# Patient Record
Sex: Female | Born: 1985 | Race: White | Hispanic: No | Marital: Married | Smoking: Current every day smoker
Health system: Southern US, Community
[De-identification: ages and names within clinical notes are randomized; demographics above are authoritative.]

## PROBLEM LIST (undated history)

## (undated) DIAGNOSIS — N390 Urinary tract infection, site not specified: Secondary | ICD-10-CM

## (undated) DIAGNOSIS — Z8719 Personal history of other diseases of the digestive system: Secondary | ICD-10-CM

## (undated) DIAGNOSIS — T8859XA Other complications of anesthesia, initial encounter: Secondary | ICD-10-CM

## (undated) DIAGNOSIS — J40 Bronchitis, not specified as acute or chronic: Secondary | ICD-10-CM

## (undated) DIAGNOSIS — D649 Anemia, unspecified: Secondary | ICD-10-CM

## (undated) DIAGNOSIS — F329 Major depressive disorder, single episode, unspecified: Secondary | ICD-10-CM

## (undated) DIAGNOSIS — F1910 Other psychoactive substance abuse, uncomplicated: Secondary | ICD-10-CM

## (undated) DIAGNOSIS — K449 Diaphragmatic hernia without obstruction or gangrene: Secondary | ICD-10-CM

## (undated) DIAGNOSIS — F32A Depression, unspecified: Secondary | ICD-10-CM

## (undated) HISTORY — DX: Urinary tract infection, site not specified: N39.0

## (undated) HISTORY — DX: Anemia, unspecified: D64.9

## (undated) HISTORY — PX: TUBAL LIGATION: SHX77

## (undated) HISTORY — PX: DILATION AND CURETTAGE OF UTERUS: SHX78

## (undated) HISTORY — PX: TONSILLECTOMY: SUR1361

---
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
## 2005-10-05 ENCOUNTER — Emergency Department: Payer: Self-pay | Admitting: Emergency Medicine

## 2006-02-26 ENCOUNTER — Observation Stay: Payer: Self-pay | Admitting: Obstetrics & Gynecology

## 2006-02-28 ENCOUNTER — Observation Stay: Payer: Self-pay | Admitting: Unknown Physician Specialty

## 2006-04-08 ENCOUNTER — Observation Stay: Payer: Self-pay | Admitting: Obstetrics & Gynecology

## 2006-04-14 ENCOUNTER — Observation Stay: Payer: Self-pay | Admitting: Obstetrics and Gynecology

## 2006-04-26 ENCOUNTER — Observation Stay: Payer: Self-pay | Admitting: Obstetrics and Gynecology

## 2006-04-28 ENCOUNTER — Inpatient Hospital Stay: Payer: Self-pay

## 2006-10-27 ENCOUNTER — Emergency Department: Payer: Self-pay | Admitting: Emergency Medicine

## 2007-01-01 ENCOUNTER — Emergency Department: Payer: Self-pay | Admitting: Emergency Medicine

## 2007-07-27 ENCOUNTER — Observation Stay: Payer: Self-pay

## 2007-09-18 ENCOUNTER — Observation Stay: Payer: Self-pay | Admitting: Certified Nurse Midwife

## 2007-09-23 ENCOUNTER — Observation Stay: Payer: Self-pay | Admitting: Obstetrics and Gynecology

## 2007-10-05 ENCOUNTER — Inpatient Hospital Stay: Payer: Self-pay

## 2007-12-21 ENCOUNTER — Emergency Department: Payer: Self-pay | Admitting: Emergency Medicine

## 2007-12-23 ENCOUNTER — Ambulatory Visit: Payer: Self-pay | Admitting: Emergency Medicine

## 2008-05-25 ENCOUNTER — Emergency Department: Payer: Self-pay | Admitting: Unknown Physician Specialty

## 2009-06-24 ENCOUNTER — Emergency Department: Payer: Self-pay | Admitting: Emergency Medicine

## 2009-07-13 ENCOUNTER — Emergency Department: Payer: Self-pay | Admitting: Emergency Medicine

## 2009-08-01 ENCOUNTER — Emergency Department: Payer: Self-pay | Admitting: Emergency Medicine

## 2009-08-30 ENCOUNTER — Emergency Department: Payer: Self-pay | Admitting: Emergency Medicine

## 2009-09-01 ENCOUNTER — Emergency Department: Payer: Self-pay | Admitting: Emergency Medicine

## 2009-10-20 ENCOUNTER — Emergency Department: Payer: Self-pay | Admitting: Emergency Medicine

## 2009-11-01 ENCOUNTER — Emergency Department: Payer: Self-pay | Admitting: Emergency Medicine

## 2009-11-17 ENCOUNTER — Emergency Department: Payer: Self-pay | Admitting: Emergency Medicine

## 2010-03-03 ENCOUNTER — Observation Stay: Payer: Self-pay | Admitting: Obstetrics and Gynecology

## 2010-03-17 ENCOUNTER — Observation Stay: Payer: Self-pay | Admitting: Obstetrics and Gynecology

## 2010-04-16 ENCOUNTER — Observation Stay: Payer: Self-pay | Admitting: Obstetrics and Gynecology

## 2010-04-19 ENCOUNTER — Observation Stay: Payer: Self-pay

## 2010-05-02 ENCOUNTER — Ambulatory Visit: Payer: Self-pay | Admitting: Obstetrics and Gynecology

## 2010-05-03 ENCOUNTER — Observation Stay: Payer: Self-pay | Admitting: Obstetrics and Gynecology

## 2010-05-10 ENCOUNTER — Observation Stay: Payer: Self-pay | Admitting: Obstetrics and Gynecology

## 2010-05-16 ENCOUNTER — Observation Stay: Payer: Self-pay | Admitting: Obstetrics and Gynecology

## 2010-05-19 ENCOUNTER — Inpatient Hospital Stay: Payer: Self-pay

## 2010-06-21 ENCOUNTER — Ambulatory Visit: Payer: Self-pay | Admitting: Anesthesiology

## 2010-06-24 ENCOUNTER — Ambulatory Visit: Payer: Self-pay | Admitting: Obstetrics and Gynecology

## 2010-08-13 ENCOUNTER — Emergency Department: Payer: Self-pay | Admitting: Emergency Medicine

## 2011-03-27 ENCOUNTER — Emergency Department: Payer: Self-pay | Admitting: Emergency Medicine

## 2011-03-27 LAB — URINALYSIS, COMPLETE
Bilirubin,UR: NEGATIVE
Blood: NEGATIVE
Glucose,UR: NEGATIVE mg/dL (ref 0–75)
Ketone: NEGATIVE
Nitrite: NEGATIVE
Ph: 7 (ref 4.5–8.0)
Protein: NEGATIVE
RBC,UR: 1 /HPF (ref 0–5)
Specific Gravity: 1.021 (ref 1.003–1.030)
Squamous Epithelial: 8
WBC UR: 1 /HPF (ref 0–5)

## 2011-03-27 LAB — PREGNANCY, URINE: Pregnancy Test, Urine: NEGATIVE m[IU]/mL

## 2011-04-03 ENCOUNTER — Emergency Department: Payer: Self-pay | Admitting: Emergency Medicine

## 2011-04-03 LAB — URINALYSIS, COMPLETE
Bacteria: NONE SEEN
Bilirubin,UR: NEGATIVE
Blood: NEGATIVE
Glucose,UR: NEGATIVE mg/dL (ref 0–75)
Ketone: NEGATIVE
Nitrite: NEGATIVE
Ph: 5 (ref 4.5–8.0)
Protein: NEGATIVE
RBC,UR: 2 /HPF (ref 0–5)
Specific Gravity: 1.018 (ref 1.003–1.030)
Squamous Epithelial: 6
WBC UR: 8 /HPF (ref 0–5)

## 2011-04-05 LAB — URINE CULTURE

## 2011-05-16 ENCOUNTER — Emergency Department: Payer: Self-pay | Admitting: Emergency Medicine

## 2013-04-30 ENCOUNTER — Emergency Department: Payer: Self-pay | Admitting: Emergency Medicine

## 2013-11-16 ENCOUNTER — Emergency Department: Payer: Self-pay | Admitting: Emergency Medicine

## 2013-12-26 ENCOUNTER — Emergency Department: Payer: Self-pay | Admitting: Emergency Medicine

## 2013-12-26 LAB — URINALYSIS, COMPLETE
Bilirubin,UR: NEGATIVE
Blood: NEGATIVE
Glucose,UR: NEGATIVE mg/dL (ref 0–75)
Ketone: NEGATIVE
Nitrite: NEGATIVE
Ph: 6 (ref 4.5–8.0)
Protein: NEGATIVE
RBC,UR: 8 /HPF (ref 0–5)
Renal Epithelial: 7
Specific Gravity: 1.025 (ref 1.003–1.030)
Squamous Epithelial: 35
WBC UR: 20 /HPF (ref 0–5)

## 2013-12-26 LAB — PREGNANCY, URINE: Pregnancy Test, Urine: NEGATIVE m[IU]/mL

## 2014-11-04 ENCOUNTER — Encounter: Payer: Self-pay | Admitting: Emergency Medicine

## 2014-11-04 ENCOUNTER — Emergency Department
Admission: EM | Admit: 2014-11-04 | Discharge: 2014-11-04 | Disposition: A | Discharge status: Home / Self Care | Payer: Medicaid Other | Source: Home / Self Care | Attending: Emergency Medicine | Admitting: Emergency Medicine

## 2014-11-04 ENCOUNTER — Encounter: Payer: Self-pay | Admitting: *Deleted

## 2014-11-04 ENCOUNTER — Emergency Department
Admission: EM | Admit: 2014-11-04 | Discharge: 2014-11-04 | Disposition: A | Discharge status: Home / Self Care | Payer: Medicaid Other | Attending: Emergency Medicine | Admitting: Emergency Medicine

## 2014-11-04 DIAGNOSIS — L299 Pruritus, unspecified: Secondary | ICD-10-CM | POA: Insufficient documentation

## 2014-11-04 DIAGNOSIS — Z7952 Long term (current) use of systemic steroids: Secondary | ICD-10-CM

## 2014-11-04 DIAGNOSIS — Z79899 Other long term (current) drug therapy: Secondary | ICD-10-CM

## 2014-11-04 DIAGNOSIS — R21 Rash and other nonspecific skin eruption: Secondary | ICD-10-CM | POA: Diagnosis present

## 2014-11-04 DIAGNOSIS — T7840XD Allergy, unspecified, subsequent encounter: Secondary | ICD-10-CM | POA: Insufficient documentation

## 2014-11-04 DIAGNOSIS — X58XXXD Exposure to other specified factors, subsequent encounter: Secondary | ICD-10-CM

## 2014-11-04 DIAGNOSIS — L509 Urticaria, unspecified: Secondary | ICD-10-CM | POA: Insufficient documentation

## 2014-11-04 DIAGNOSIS — Z720 Tobacco use: Secondary | ICD-10-CM

## 2014-11-04 LAB — BASIC METABOLIC PANEL
Anion gap: 11 (ref 5–15)
BUN: 13 mg/dL (ref 6–20)
CO2: 23 mmol/L (ref 22–32)
Calcium: 9.8 mg/dL (ref 8.9–10.3)
Chloride: 105 mmol/L (ref 101–111)
Creatinine, Ser: 0.74 mg/dL (ref 0.44–1.00)
GFR calc Af Amer: >60 mL/min (ref >60)
GFR calc non Af Amer: >60 mL/min (ref >60)
Glucose, Bld: 110 mg/dL — ABNORMAL HIGH (ref 65–99)
Potassium: 3.8 mmol/L (ref 3.5–5.1)
Sodium: 139 mmol/L (ref 135–145)

## 2014-11-04 LAB — URINALYSIS COMPLETE WITH MICROSCOPIC (ARMC ONLY)
Bilirubin Urine: NEGATIVE
Glucose, UA: NEGATIVE mg/dL
Hgb urine dipstick: NEGATIVE
Ketones, ur: NEGATIVE mg/dL
Nitrite: NEGATIVE
Protein, ur: NEGATIVE mg/dL
Specific Gravity, Urine: 1.025 (ref 1.005–1.030)
pH: 6 (ref 5.0–8.0)

## 2014-11-04 LAB — CBC
HCT: 44.1 % (ref 35.0–47.0)
Hemoglobin: 14 g/dL (ref 12.0–16.0)
MCH: 26.1 pg (ref 26.0–34.0)
MCHC: 31.9 g/dL — ABNORMAL LOW (ref 32.0–36.0)
MCV: 81.9 fL (ref 80.0–100.0)
Platelets: 367 10*3/uL (ref 150–440)
RBC: 5.38 MIL/uL — ABNORMAL HIGH (ref 3.80–5.20)
RDW: 14.4 % (ref 11.5–14.5)
WBC: 21.6 10*3/uL — ABNORMAL HIGH (ref 3.6–11.0)

## 2014-11-04 MED ORDER — EPINEPHRINE 0.3 MG/0.3ML IJ SOAJ: 0.3000 mg | Freq: Once | INTRAMUSCULAR | Status: DC

## 2014-11-04 MED ORDER — CETIRIZINE HCL 10 MG PO TABS
ORAL_TABLET | ORAL | Status: AC
  Administered 2014-11-04: 10 mg
  Filled 2014-11-04: qty 1

## 2014-11-04 MED ORDER — NON FORMULARY: 10.0000 mg | Freq: Once | Status: DC

## 2014-11-04 MED ORDER — FAMOTIDINE IN NACL 20-0.9 MG/50ML-% IV SOLN
20.0000 mg | Freq: Once | INTRAVENOUS | Status: AC
  Administered 2014-11-04: 20 mg via INTRAVENOUS
  Filled 2014-11-04: qty 50

## 2014-11-04 MED ORDER — DIPHENHYDRAMINE HCL 50 MG/ML IJ SOLN
50.0000 mg | Freq: Once | INTRAMUSCULAR | Status: AC
  Administered 2014-11-04: 50 mg via INTRAVENOUS
  Filled 2014-11-04: qty 1

## 2014-11-04 MED ORDER — PREDNISONE 20 MG PO TABS
60.0000 mg | ORAL_TABLET | ORAL | Status: AC
  Administered 2014-11-04: 60 mg via ORAL
  Filled 2014-11-04: qty 3

## 2014-11-04 MED ORDER — DIPHENHYDRAMINE HCL 50 MG/ML IJ SOLN
25.0000 mg | Freq: Once | INTRAMUSCULAR | Status: AC
  Administered 2014-11-04: 25 mg via INTRAVENOUS
  Filled 2014-11-04: qty 1

## 2014-11-04 MED ORDER — CETIRIZINE HCL 10 MG PO TABS: 10.0000 mg | ORAL_TABLET | Freq: Once | ORAL | Status: DC

## 2014-11-04 MED ORDER — PREDNISONE 20 MG PO TABS
60.0000 mg | ORAL_TABLET | Freq: Every day | ORAL | Status: AC
Start: 2014-11-04 — End: 2015-11-04

## 2014-11-04 MED ORDER — LORATADINE 10 MG PO TABS: 10.0000 mg | ORAL_TABLET | Freq: Once | ORAL | Status: DC

## 2014-11-04 MED ORDER — METHYLPREDNISOLONE SODIUM SUCC 125 MG IJ SOLR
125.0000 mg | Freq: Once | INTRAMUSCULAR | Status: AC
  Administered 2014-11-04: 125 mg via INTRAVENOUS
  Filled 2014-11-04: qty 2

## 2014-11-04 NOTE — ED Notes (Signed)
Iv from computer list d/c'd - not in pt

## 2014-11-04 NOTE — ED Provider Notes (Signed)
[facility] Emergency Department Provider Note  ____________________________________________  Time seen: Approximately 7:15 PM  I have reviewed the triage vital signs and the nursing notes.   HISTORY  Chief Complaint Headache; Weakness; and Rash    HPI Emma Fowler is a 29 y.o. female with no significant past medical history but who was seen yesterday for an allergic reaction to an unknown source.  She comes back today feeling like her face is flushed and itching again.  She states that she feels "really weird".  Back today but she did state that she did not get her prednisone filled so she has not had another dose today, and that she had not been taking Benadryl today until after the itching and the rash came back.  She denies chest pain, shortness of breath, nausea, vomiting, abdominal pain, diarrhea, dysuria.  She has not been lightheaded or dizzy, just repeats that she "feels weird".   History reviewed. No pertinent past medical history.  There are no active problems to display for this patient.   Past Surgical History  Procedure Laterality Date  . Dilation and curettage of uterus    . Tubal ligation    . Tonsillectomy      Current Outpatient Rx  Name  Route  Sig  Dispense  Refill  . EPINEPHrine (EPIPEN 2-PAK) 0.3 mg/0.3 mL IJ SOAJ injection   Intramuscular   Inject 0.3 mLs (0.3 mg total) into the muscle once.   1 Device   0   . predniSONE (DELTASONE) 20 MG tablet   Oral   Take 3 tablets (60 mg total) by mouth daily with breakfast.   15 tablet   0     Allergies Review of patient's allergies indicates no known allergies.  No family history on file.  Social History Social History  Substance Use Topics  . Smoking status: Current Every Day Smoker -- 0.50 packs/day    Types: Cigarettes  . Smokeless tobacco: None  . Alcohol Use: Yes     Comment: occasionally    Review of Systems Constitutional: No fever/chills Eyes: No visual  changes. ENT: No sore throat. Cardiovascular: Denies chest pain. Respiratory: Denies shortness of breath. Gastrointestinal: No abdominal pain.  No nausea, no vomiting.  No diarrhea.  No constipation. Genitourinary: Negative for dysuria. Musculoskeletal: Negative for back pain. Skin: Red rash primarily on her face and on her back shoulders which is itching Neurological: Negative for headaches, focal weakness or numbness.  10-point ROS otherwise negative.  ____________________________________________   PHYSICAL EXAM:  VITAL SIGNS: ED Triage Vitals  Enc Vitals Group     BP 11/04/14 1906 127/78 mmHg     Pulse Rate 11/04/14 1906 97     Resp 11/04/14 1906 16     Temp 11/04/14 1906 98.2 F (36.8 C)     Temp Source 11/04/14 1906 Oral     SpO2 11/04/14 1906 97 %     Weight 11/04/14 1906 275 lb (124.739 kg)     Height 11/04/14 1906 5\' 4"  (1.626 m)     Head Cir --      Peak Flow --      Pain Score 11/04/14 1907 10     Pain Loc --      Pain Edu? --      Excl. in GC? --     Constitutional: Alert and oriented. Well appearing and in no acute distress. Eyes: Conjunctivae are normal. PERRL. EOMI. Head: Atraumatic. Nose: No congestion/rhinnorhea. Mouth/Throat: Mucous membranes are  moist.  Oropharynx non-erythematous. Neck: No stridor.   Cardiovascular: Normal rate, regular rhythm. Grossly normal heart sounds.  Good peripheral circulation. Respiratory: Normal respiratory effort.  No retractions. Lungs CTAB.  No wheezing Gastrointestinal: Obese, Soft and nontender. No distention. No abdominal bruits. No CVA tenderness. Musculoskeletal: No lower extremity tenderness nor edema.  No joint effusions. Neurologic:  Normal speech and language. No gross focal neurologic deficits are appreciated.  Skin:  Skin is warm, dry and intact.  The patient has erythema on her face and on her shoulders that looked most consistent with sun exposed areas, although she insists that she has not gotten a sunburn.   She is scratching at her neck but has no obvious urticaria.  She has no evidence of cellulitis, no pustules, no bullae. Psychiatric: Mood and affect are flat. Speech and behavior are normal.  ____________________________________________   LABS (all labs ordered are listed, but only abnormal results are displayed)  Labs Reviewed  BASIC METABOLIC PANEL - Abnormal; Notable for the following:    Glucose, Bld 110 (*)    All other components within normal limits  CBC - Abnormal; Notable for the following:    WBC 21.6 (*)    RBC 5.38 (*)    MCHC 31.9 (*)    All other components within normal limits  URINALYSIS COMPLETEWITH MICROSCOPIC ([facility] ONLY) - Abnormal; Notable for the following:    Color, Urine YELLOW (*)    APPearance CLOUDY (*)    Leukocytes, UA 2+ (*)    Bacteria, UA RARE (*)    Squamous Epithelial / LPF TOO NUMEROUS TO COUNT (*)    All other components within normal limits   ____________________________________________  EKG  Not indicated ____________________________________________  RADIOLOGY   Not indicated  ____________________________________________   PROCEDURES  Procedure(s) performed: None  Critical Care performed: No ____________________________________________   INITIAL IMPRESSION / ASSESSMENT AND PLAN / ED COURSE  Pertinent labs & imaging results that were available during my care of the patient were reviewed by me and considered in my medical decision making (see chart for details).  The patient has no signs or symptoms of anaphylaxis.  She was stable through several hours of observation in the emergency department.  Her leukocytosis is almost certainly a result of the Solu-Medrol she received yesterday as well as her allergic reaction in general.  I advised her that it is important for her to take regular doses of Benadryl and to continue with the prednisone as prescribed.  I gave her a dose of prednisone and another dose of Benadryl here.  She  understands importance of follow-up.  I do not believe that she has an emergent medical condition at this time she is feeling better, ambulating with no difficulty prior to discharge.   ____________________________________________  FINAL CLINICAL IMPRESSION(S) / ED DIAGNOSES  Final diagnoses:  Allergic reaction, subsequent encounter      NEW MEDICATIONS STARTED DURING THIS VISIT:  New Prescriptions   No medications on file     Loleta Rose, MD 11/04/14 2145

## 2014-11-04 NOTE — ED Notes (Signed)
Pt given a prescription for prednisone but states pharmacy was closed when her husband went to pick it up.

## 2014-11-04 NOTE — ED Notes (Signed)
Patient reports being treated for an allergic reaction last night with benadryl and pepcid.  Patient states she left the ED at 4am and went home and to bed.  Patient states this afternoon at about 2:30 patient felt her face getting really hot, now patient reports severe headache, and weakness.  Patient states, "I feel really weird."  Patient reports the rash from yesterday came back and patient reports taking benadryl today around 5:30pm and reports rash improving.  Patient is tearful in triage.

## 2014-11-04 NOTE — ED Notes (Signed)
Pt reports hives that began under her breast and has progressively spread that began approx 15:00 yesterday, pt has taken x2 doses of benandryl w/o relief, last dose approx pta. Pt states now her face has become red and hot. Pt in no acute distress, denies shortness of breath or oral/throat swelling.

## 2014-11-04 NOTE — ED Provider Notes (Signed)
[facility] Emergency Department Provider Note  ____________________________________________  Time seen: 1:30 AM  I have reviewed the triage vital signs and the nursing notes.   HISTORY  Chief Complaint Pruritis and Urticaria     HPI JEANNIFER DRAKEFORD is a 29 y.o. female presents with generalized pruritic urticarial rash 1 hour before evaluation. Patient denies any dyspnea no difficulty swallowing. Patient does not have any known allergens. Patient denies any change in detergent no change in dietary intake and no new medications.   Past medical history None There are no active problems to display for this patient.   Past Surgical History  Procedure Laterality Date  . Dilation and curettage of uterus    . Tubal ligation    . Tonsillectomy      No current outpatient prescriptions on file.  Allergies No known drug allergies History reviewed. No pertinent family history.  Social History Social History  Substance Use Topics  . Smoking status: Current Every Day Smoker -- 0.50 packs/day    Types: Cigarettes  . Smokeless tobacco: None  . Alcohol Use: Yes     Comment: occasionally    Review of Systems  Constitutional: Negative for fever. Eyes: Negative for visual changes. ENT: Negative for sore throat. Cardiovascular: Negative for chest pain. Respiratory: Negative for shortness of breath. Gastrointestinal: Negative for abdominal pain, vomiting and diarrhea. Genitourinary: Negative for dysuria. Musculoskeletal: Negative for back pain. Skin: Positive rash Neurological: Negative for headaches, focal weakness or numbness.   10-point ROS otherwise negative.  ____________________________________________   PHYSICAL EXAM:  VITAL SIGNS: ED Triage Vitals  Enc Vitals Group     BP 11/04/14 0045 142/74 mmHg     Pulse Rate 11/04/14 0045 110     Resp 11/04/14 0045 18     Temp 11/04/14 0045 98.2 F (36.8 C)     Temp Source 11/04/14 0045 Oral      SpO2 11/04/14 0045 97 %     Weight 11/04/14 0045 275 lb (124.739 kg)     Height 11/04/14 0045 5\' 4"  (1.626 m)     Head Cir --      Peak Flow --      Pain Score --      Pain Loc --      Pain Edu? --      Excl. in GC? --      Constitutional: Alert and oriented. Well appearing and in no distress. Eyes: Conjunctivae are normal. PERRL. Normal extraocular movements. ENT   Head: Normocephalic and atraumatic.   Nose: No congestion/rhinnorhea.   Mouth/Throat: Mucous membranes are moist.   Neck: No stridor. Cardiovascular: Normal rate, regular rhythm. Normal and symmetric distal pulses are present in all extremities. No murmurs, rubs, or gallops. Respiratory: Normal respiratory effort without tachypnea nor retractions. Breath sounds are clear and equal bilaterally. No wheezes/rales/rhonchi. Gastrointestinal: Soft and nontender. No distention. There is no CVA tenderness. Genitourinary: deferred Musculoskeletal: Nontender with normal range of motion in all extremities. No joint effusions.  No lower extremity tenderness nor edema. Neurologic:  Normal speech and language. No gross focal neurologic deficits are appreciated. Speech is normal.  Skin:  Generalized urticarial rash Psychiatric: Mood and affect are normal. Speech and behavior are normal. Patient exhibits appropriate insight and judgment.     INITIAL IMPRESSION / ASSESSMENT AND PLAN / ED COURSE  Pertinent labs & imaging results that were available during my care of the patient were reviewed by me and considered in my medical decision making (see chart  for details).  Patient received IV Benadryl 50 mg Solu-Medrol 125 mg and Pepcid with complete resolution of rash and pruritus.  ____________________________________________   FINAL CLINICAL IMPRESSION(S) / ED DIAGNOSES  Final diagnoses:  Hives      Darci Current, MD 11/04/14 0230

## 2014-11-04 NOTE — Discharge Instructions (Signed)
Hives Hives are itchy, red, swollen areas of the skin. They can vary in size and location on your body. Hives can come and go for hours or several days (acute hives) or for several weeks (chronic hives). Hives do not spread from person to person (noncontagious). They may get worse with scratching, exercise, and emotional stress. CAUSES   Allergic reaction to food, additives, or drugs.  Infections, including the common cold.  Illness, such as vasculitis, lupus, or thyroid disease.  Exposure to sunlight, heat, or cold.  Exercise.  Stress.  Contact with chemicals. SYMPTOMS   Red or white swollen patches on the skin. The patches may change size, shape, and location quickly and repeatedly.  Itching.  Swelling of the hands, feet, and face. This may occur if hives develop deeper in the skin. DIAGNOSIS  Your caregiver can usually tell what is wrong by performing a physical exam. Skin or blood tests may also be done to determine the cause of your hives. In some cases, the cause cannot be determined. TREATMENT  Mild cases usually get better with medicines such as antihistamines. Severe cases may require an emergency epinephrine injection. If the cause of your hives is known, treatment includes avoiding that trigger.  HOME CARE INSTRUCTIONS   Avoid causes that trigger your hives.  Take antihistamines as directed by your caregiver to reduce the severity of your hives. Non-sedating or low-sedating antihistamines are usually recommended. Do not drive while taking an antihistamine.  Take any other medicines prescribed for itching as directed by your caregiver.  Wear loose-fitting clothing.  Keep all follow-up appointments as directed by your caregiver. SEEK MEDICAL CARE IF:   You have persistent or severe itching that is not relieved with medicine.  You have painful or swollen joints. SEEK IMMEDIATE MEDICAL CARE IF:   You have a fever.  Your tongue or lips are swollen.  You have  trouble breathing or swallowing.  You feel tightness in the throat or chest.  You have abdominal pain. These problems may be the first sign of a life-threatening allergic reaction. Call your local emergency services (911 in [place] MAKE SURE YOU:   Understand these instructions.  Will watch your condition.  Will get help right away if you are not doing well or get worse. Document Released: 02/24/2005 Document Revised: 03/01/2013 Document Reviewed: 05/20/2011 [employer] Patient Information 2015 [employer]. This information is not intended to replace advice given to you by your health care provider. Make sure you discuss any questions you have with your health care provider.  

## 2014-11-04 NOTE — Discharge Instructions (Signed)
You have been seen in the Emergency Department (ED) today for an allergic reaction.  Please take your medications as prescribed and follow up with your doctor as indicated.  You should also take over-the-counter Benadryl around the clock for the next three days according to the dosing instructions on the package.  Return to the Emergency Department (ED) if you experience any worsening or new symptoms that concern you.   Allergies Allergies may happen from anything your body is sensitive to. This may be food, medicines, pollens, chemicals, and nearly anything around you in everyday life that produces allergens. An allergen is anything that causes an allergy producing substance. Heredity is often a factor in causing these problems. This means you may have some of the same allergies as your parents. Food allergies happen in all age groups. Food allergies are some of the most severe and life threatening. Some common food allergies are cow's milk, seafood, eggs, nuts, wheat, and soybeans. SYMPTOMS   Swelling around the mouth.  An itchy red rash or hives.  Vomiting or diarrhea.  Difficulty breathing. SEVERE ALLERGIC REACTIONS ARE LIFE-THREATENING. This reaction is called anaphylaxis. It can cause the mouth and throat to swell and cause difficulty with breathing and swallowing. In severe reactions only a trace amount of food (for example, peanut oil in a salad) may cause death within seconds. Seasonal allergies occur in all age groups. These are seasonal because they usually occur during the same season every year. They may be a reaction to molds, grass pollens, or tree pollens. Other causes of problems are house dust mite allergens, pet dander, and mold spores. The symptoms often consist of nasal congestion, a runny itchy nose associated with sneezing, and tearing itchy eyes. There is often an associated itching of the mouth and ears. The problems happen when you come in contact with pollens and other  allergens. Allergens are the particles in the air that the body reacts to with an allergic reaction. This causes you to release allergic antibodies. Through a chain of events, these eventually cause you to release histamine into the blood stream. Although it is meant to be protective to the body, it is this release that causes your discomfort. This is why you were given anti-histamines to feel better. If you are unable to pinpoint the offending allergen, it may be determined by skin or blood testing. Allergies cannot be cured but can be controlled with medicine. Hay fever is a collection of all or some of the seasonal allergy problems. It may often be treated with simple over-the-counter medicine such as diphenhydramine. Take medicine as directed. Do not drink alcohol or drive while taking this medicine. Check with your caregiver or package insert for child dosages. If these medicines are not effective, there are many new medicines your caregiver can prescribe. Stronger medicine such as nasal spray, eye drops, and corticosteroids may be used if the first things you try do not work well. Other treatments such as immunotherapy or desensitizing injections can be used if all else fails. Follow up with your caregiver if problems continue. These seasonal allergies are usually not life threatening. They are generally more of a nuisance that can often be handled using medicine. HOME CARE INSTRUCTIONS   If unsure what causes a reaction, keep a diary of foods eaten and symptoms that follow. Avoid foods that cause reactions.  If hives or rash are present:  Take medicine as directed.  You may use an over-the-counter antihistamine (diphenhydramine) for hives and itching as  needed.  Apply cold compresses (cloths) to the skin or take baths in cool water. Avoid hot baths or showers. Heat will make a rash and itching worse.  If you are severely allergic:  Following a treatment for a severe reaction, hospitalization  is often required for closer follow-up.  Wear a medic-alert bracelet or necklace stating the allergy.  You and your family must learn how to give adrenaline or use an anaphylaxis kit.  If you have had a severe reaction, always carry your anaphylaxis kit or EpiPen with you. Use this medicine as directed by your caregiver if a severe reaction is occurring. Failure to do so could have a fatal outcome. SEEK MEDICAL CARE IF:  You suspect a food allergy. Symptoms generally happen within 30 minutes of eating a food.  Your symptoms have not gone away within 2 days or are getting worse.  You develop new symptoms.  You want to retest yourself or your child with a food or drink you think causes an allergic reaction. Never do this if an anaphylactic reaction to that food or drink has happened before. Only do this under the care of a caregiver. SEEK IMMEDIATE MEDICAL CARE IF:   You have difficulty breathing, are wheezing, or have a tight feeling in your chest or throat.  You have a swollen mouth, or you have hives, swelling, or itching all over your body.  You have had a severe reaction that has responded to your anaphylaxis kit or an EpiPen. These reactions may return when the medicine has worn off. These reactions should be considered life threatening. MAKE SURE YOU:   Understand these instructions.  Will watch your condition.  Will get help right away if you are not doing well or get worse. Document Released: 05/20/2002 Document Revised: 06/21/2012 Document Reviewed: 10/25/2007 [facility] Patient Information 2015 [place], [place]. This information is not intended to replace advice given to you by your health care provider. Make sure you discuss any questions you have with your health care provider.  Rash A rash is a change in the color or texture of your skin. There are many different types of rashes. You may have other problems that accompany your rash. CAUSES   Infections.  Allergic  reactions. This can include allergies to pets or foods.  Certain medicines.  Exposure to certain chemicals, soaps, or cosmetics.  Heat.  Exposure to poisonous plants.  Tumors, both cancerous and noncancerous. SYMPTOMS   Redness.  Scaly skin.  Itchy skin.  Dry or cracked skin.  Bumps.  Blisters.  Pain. DIAGNOSIS  Your caregiver may do a physical exam to determine what type of rash you have. A skin sample (biopsy) may be taken and examined under a microscope. TREATMENT  Treatment depends on the type of rash you have. Your caregiver may prescribe certain medicines. For serious conditions, you may need to see a skin doctor (dermatologist). HOME CARE INSTRUCTIONS   Avoid the substance that caused your rash.  Do not scratch your rash. This can cause infection.  You may take cool baths to help stop itching.  Only take over-the-counter or prescription medicines as directed by your caregiver.  Keep all follow-up appointments as directed by your caregiver. SEEK IMMEDIATE MEDICAL CARE IF:  You have increasing pain, swelling, or redness.  You have a fever.  You have new or severe symptoms.  You have body aches, diarrhea, or vomiting.  Your rash is not better after 3 days. MAKE SURE YOU:  Understand these instructions.  Will  watch your condition.  Will get help right away if you are not doing well or get worse. Document Released: 02/14/2002 Document Revised: 05/19/2011 Document Reviewed: 12/09/2010 [facility] Patient Information 2015 [place], [place]. This information is not intended to replace advice given to you by your health care provider. Make sure you discuss any questions you have with your health care provider.

## 2014-11-04 NOTE — ED Notes (Signed)
Pt seen here earlier tonight for allergic reaction; was given medications just prior to discharge; returns with increased itching and rash; burning sensation; tongue feels numb; mouth feels dry

## 2014-11-05 ENCOUNTER — Emergency Department
Admission: EM | Admit: 2014-11-05 | Discharge: 2014-11-05 | Disposition: A | Discharge status: Home / Self Care | Payer: Medicaid Other | Source: Home / Self Care

## 2014-11-05 MED ORDER — DIPHENHYDRAMINE HCL 12.5 MG/5ML PO ELIX
50.0000 mg | ORAL_SOLUTION | Freq: Once | ORAL | Status: AC
  Administered 2014-11-05: 50 mg via ORAL
  Filled 2014-11-05: qty 20

## 2014-11-05 MED ORDER — RANITIDINE HCL 150 MG/10ML PO SYRP
300.0000 mg | ORAL_SOLUTION | Freq: Once | ORAL | Status: AC
  Administered 2014-11-05: 300 mg via ORAL
  Filled 2014-11-05: qty 20

## 2014-11-06 LAB — URINE CULTURE
Culture: >=100000
Special Requests: NORMAL

## 2014-11-07 NOTE — Progress Notes (Signed)
Dr. Gladstone Pih authorized a prescription for amoxicillin 875 mg bid for 7 days. Called in prescription to [facility] per patient request.   Luisa Hart, PharmD

## 2015-03-07 ENCOUNTER — Encounter: Payer: Self-pay | Admitting: *Deleted

## 2015-03-07 ENCOUNTER — Emergency Department
Admission: EM | Admit: 2015-03-07 | Discharge: 2015-03-07 | Disposition: A | Discharge status: Home / Self Care | Payer: Medicaid Other | Attending: Emergency Medicine | Admitting: Emergency Medicine

## 2015-03-07 DIAGNOSIS — F17210 Nicotine dependence, cigarettes, uncomplicated: Secondary | ICD-10-CM | POA: Insufficient documentation

## 2015-03-07 DIAGNOSIS — Z7952 Long term (current) use of systemic steroids: Secondary | ICD-10-CM | POA: Insufficient documentation

## 2015-03-07 DIAGNOSIS — N390 Urinary tract infection, site not specified: Secondary | ICD-10-CM | POA: Insufficient documentation

## 2015-03-07 DIAGNOSIS — Z3202 Encounter for pregnancy test, result negative: Secondary | ICD-10-CM | POA: Insufficient documentation

## 2015-03-07 DIAGNOSIS — R109 Unspecified abdominal pain: Secondary | ICD-10-CM

## 2015-03-07 LAB — POCT PREGNANCY, URINE: Preg Test, Ur: NEGATIVE

## 2015-03-07 LAB — URINALYSIS COMPLETE WITH MICROSCOPIC (ARMC ONLY)
Bilirubin Urine: NEGATIVE
Glucose, UA: NEGATIVE mg/dL
Hgb urine dipstick: NEGATIVE
Ketones, ur: NEGATIVE mg/dL
Nitrite: NEGATIVE
Protein, ur: NEGATIVE mg/dL
Specific Gravity, Urine: 1.019 (ref 1.005–1.030)
pH: 6 (ref 5.0–8.0)

## 2015-03-07 MED ORDER — DEXTROSE 5 % IV SOLN: 1.0000 g | Freq: Once | INTRAVENOUS | Status: DC

## 2015-03-07 MED ORDER — KETOROLAC TROMETHAMINE 60 MG/2ML IM SOLN: 60.0000 mg | Freq: Once | INTRAMUSCULAR | Status: DC

## 2015-03-07 MED ORDER — KETOROLAC TROMETHAMINE 60 MG/2ML IM SOLN
60.0000 mg | Freq: Once | INTRAMUSCULAR | Status: AC
  Administered 2015-03-07: 60 mg via INTRAMUSCULAR
  Filled 2015-03-07: qty 2

## 2015-03-07 MED ORDER — KETOROLAC TROMETHAMINE 30 MG/ML IJ SOLN: 30.0000 mg | Freq: Once | INTRAMUSCULAR | Status: DC

## 2015-03-07 MED ORDER — HYDROCODONE-ACETAMINOPHEN 5-325 MG PO TABS: 1.0000 | ORAL_TABLET | ORAL | Status: DC | PRN

## 2015-03-07 MED ORDER — LIDOCAINE HCL (PF) 1 % IJ SOLN
2.0000 mL | Freq: Once | INTRAMUSCULAR | Status: AC
  Administered 2015-03-07: 2 mL

## 2015-03-07 MED ORDER — CEFTRIAXONE SODIUM 1 G IJ SOLR
1.0000 g | Freq: Once | INTRAMUSCULAR | Status: AC
  Administered 2015-03-07: 1 g via INTRAMUSCULAR
  Filled 2015-03-07: qty 10

## 2015-03-07 MED ORDER — LIDOCAINE HCL (PF) 1 % IJ SOLN
INTRAMUSCULAR | Status: AC
  Filled 2015-03-07: qty 5

## 2015-03-07 MED ORDER — CIPROFLOXACIN HCL 500 MG PO TABS: 500.0000 mg | ORAL_TABLET | Freq: Two times a day (BID) | ORAL | Status: DC

## 2015-03-07 NOTE — ED Provider Notes (Signed)
[facility] Emergency Department Provider Note  ____________________________________________  Time seen: Approximately 11:17 AM  I have reviewed the triage vital signs and the nursing notes.   HISTORY  Chief Complaint Flank Pain  HPI Emma Fowler is a 29 y.o. female . Complaint of right flank pain which started yesterday. Patient is unaware of any urinary symptoms and denies any nausea, vomiting, fever or chills. Patient states approximately 3 months ago she went to [facility] walk-in clinic and was diagnosed with a urinary tract infection. She states she took all the antibiotics but is unsure of what the name of this was. 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 Patient did not go back for recheck of her urine. Currently patient rates her pain as an 8 out of 10. Patient did drive herself to the emergency room. Patient denies any history of kidney stones and denies any family history kidney stones. She denies any vaginal discharge.   History reviewed. No pertinent past medical history.  There are no active problems to display for this patient.   Past Surgical History  Procedure Laterality Date  . Dilation and curettage of uterus    . Tubal ligation    . Tonsillectomy      Current Outpatient Rx  Name  Route  Sig  Dispense  Refill  . ciprofloxacin (CIPRO) 500 MG tablet   Oral   Take 1 tablet (500 mg total) by mouth 2 (two) times daily.   20 tablet   0   . EPINEPHrine (EPIPEN 2-PAK) 0.3 mg/0.3 mL IJ SOAJ injection   Intramuscular   Inject 0.3 mLs (0.3 mg total) into the muscle once.   1 Device   0   . HYDROcodone-acetaminophen (NORCO/VICODIN) 5-325 MG tablet   Oral   Take 1 tablet by mouth every 4 (four) hours as needed for moderate pain.   20 tablet   0   . predniSONE (DELTASONE) 20 MG tablet   Oral   Take 3 tablets (60 mg total) by mouth daily with breakfast.   15 tablet   0      Allergies Review of patient's allergies indicates no known allergies.  No family history on file.  Social History Social History  Substance Use Topics  . Smoking status: Current Every Day Smoker -- 0.50 packs/day    Types: Cigarettes  . Smokeless tobacco: None  . Alcohol Use: Yes     Comment: occasionally    Review of Systems Constitutional: No fever/chills Eyes: No visual changes. ENT: No sore throat. Cardiovascular: Denies chest pain. Respiratory: Denies shortness of breath. Gastrointestinal: No abdominal pain.  No nausea, no vomiting.  No diarrhea.  No constipation. Genitourinary: Negative for dysuria. Positive right flank pain. Musculoskeletal: Negative for back pain. Skin: Negative for rash. Neurological: Negative for headaches, focal weakness or numbness.  10-point ROS otherwise negative.  ____________________________________________   PHYSICAL EXAM:  VITAL SIGNS: ED Triage Vitals  Enc Vitals Group     BP 03/07/15 0957 128/78 mmHg     Pulse Rate 03/07/15 0957 91     Resp 03/07/15 0957 20     Temp 03/07/15 0957 98.1 F (36.7 C)     Temp Source 03/07/15 0957 Oral     SpO2 03/07/15 0957 96 %     Weight 03/07/15 0957 260 lb (117.935 kg)     Height 03/07/15 0957  (1.626 m)     Head Cir --  Peak Flow --      Pain Score 03/07/15 0958 8     Pain Loc --      Pain Edu? --      Excl. in GC? --     Constitutional: Alert and oriented. Well appearing and in no acute distress. Eyes: Conjunctivae are normal. PERRL. EOMI. Head: Atraumatic. Nose: No congestion/rhinnorhea. Neck: No stridor.  Supple Cardiovascular: Normal rate, regular rhythm. Grossly normal heart sounds.  Good peripheral circulation. Respiratory: Normal respiratory effort.  No retractions. Lungs CTAB. Gastrointestinal: Soft and nontender. No distention.  Positive right flank tenderness. Musculoskeletal: Nations back felt to show any gross abnormality. There is tenderness also on the  soft tissue right paravertebral muscles upper lumbar area. Range of motion is within normal limits without restriction. There is no active muscle spasm seen. There is no tenderness on palpation of the vertebral bodies. Normal gait was noted. Neurologic:  Normal speech and language. No gross focal neurologic deficits are appreciated. No gait instability. Skin:  Skin is warm, dry and intact. No rash noted. Psychiatric: Mood and affect are normal. Speech and behavior are normal.  ____________________________________________   LABS (all labs ordered are listed, but only abnormal results are displayed)  Labs Reviewed  URINALYSIS COMPLETEWITH MICROSCOPIC ([facility] ONLY) - Abnormal; Notable for the following:    Color, Urine YELLOW (*)    APPearance CLEAR (*)    Leukocytes, UA TRACE (*)    Bacteria, UA MANY (*)    Squamous Epithelial / LPF 6-30 (*)    All other components within normal limits  URINE CULTURE  POC URINE PREG, ED  POCT PREGNANCY, URINE    PROCEDURES  Procedure(s) performed: None  Critical Care performed: No  ____________________________________________   INITIAL IMPRESSION / ASSESSMENT AND PLAN / ED COURSE  Pertinent labs & imaging results that were available during my care of the patient were reviewed by me and considered in my medical decision making (see chart for details).  Patient refused IV medications as she states she has "poor veins". She prefers IM injection. Patient was given Rocephin 1 g IM and Toradol 60 mg IM in the emergency room. She was discharged on a prescription of Norco as needed for pain and Cipro 500 mg twice a day for 10 days. [facility] any [facility] problems and also to have her urine rechecked in 10 days. ____________________________________________   FINAL CLINICAL IMPRESSION(S) / ED DIAGNOSES  Final diagnoses:  Acute urinary tract infection  Acute right flank pain      Tommi RumpsRhonda L Navika Hoopes,  PA-C 03/07/15 1210  Phineas SemenGraydon Goodman, MD 03/07/15 1218

## 2015-03-07 NOTE — Discharge Instructions (Signed)
Increase fluids. [facility] if any continued problems for a recheck of your urine after finishing the antibiotic. Begin taking Cipro twice a day for 10 days. Norco as needed for pain as directed only. Return to the emergency room if any vomiting or unable to take your antibiotic.

## 2015-03-07 NOTE — ED Notes (Signed)
Pt reports right flank pain starting yesterday, pt denies any other symptoms

## 2015-03-07 NOTE — ED Notes (Signed)
States she developed pain to right lower back since yesterday  No fever or n/v or urinary sx's  But states pain is radiating up her back

## 2015-03-09 LAB — URINE CULTURE
Culture: 70000
Special Requests: NORMAL

## 2015-03-14 NOTE — Progress Notes (Addendum)
Day 1   ED Culture Results   Allergies: NKA Visit Date:03/07/15 Chief Complaint:UTI/ Acute Rt Flank pain  Culture Type: Urine  Culture Results: 70 k CFU Staph aureus  Original Abx given:Rocephin 1 g IM x 1 and Cipro 500 mg PO BID x 10 days  Original Abx sensitive, intermediate, or resistant: Cipro resistant  Recommended Abx:Bactrim DS 1 tablet PO BID x 10 days ED Physician:Paduchowski, Becky SaxKevin Contacted Patient:Phone # on file is not a working number. [facility]; however no one answered the phone. Will need to attempt again tomorrow.   Demetrius Charityeldrin D. James, PharmD, BCPS Clinical Pharmacist  Patient's phone number updated in computer and abx called into [facility] per Viviano SimasLiz Gannon.   Luisa HartScott Jaeli Grubb, PharmD Clinical Pharmacist

## 2015-03-15 ENCOUNTER — Telehealth: Payer: Self-pay | Admitting: Emergency Medicine

## 2015-03-21 ENCOUNTER — Emergency Department: Payer: Medicaid Other

## 2015-03-21 ENCOUNTER — Encounter: Payer: Self-pay | Admitting: Emergency Medicine

## 2015-03-21 ENCOUNTER — Emergency Department
Admission: EM | Admit: 2015-03-21 | Discharge: 2015-03-21 | Disposition: A | Discharge status: Home / Self Care | Payer: Medicaid Other | Attending: Emergency Medicine | Admitting: Emergency Medicine

## 2015-03-21 DIAGNOSIS — Z3202 Encounter for pregnancy test, result negative: Secondary | ICD-10-CM | POA: Diagnosis not present

## 2015-03-21 DIAGNOSIS — Z87891 Personal history of nicotine dependence: Secondary | ICD-10-CM | POA: Insufficient documentation

## 2015-03-21 DIAGNOSIS — R109 Unspecified abdominal pain: Secondary | ICD-10-CM | POA: Diagnosis present

## 2015-03-21 DIAGNOSIS — N3090 Cystitis, unspecified without hematuria: Secondary | ICD-10-CM | POA: Diagnosis not present

## 2015-03-21 DIAGNOSIS — R51 Headache: Secondary | ICD-10-CM | POA: Diagnosis not present

## 2015-03-21 DIAGNOSIS — N390 Urinary tract infection, site not specified: Secondary | ICD-10-CM

## 2015-03-21 HISTORY — DX: Major depressive disorder, single episode, unspecified: F32.9

## 2015-03-21 HISTORY — DX: Depression, unspecified: F32.A

## 2015-03-21 LAB — BASIC METABOLIC PANEL
Anion gap: 4 — ABNORMAL LOW (ref 5–15)
BUN: 16 mg/dL (ref 6–20)
CO2: 26 mmol/L (ref 22–32)
Calcium: 9.1 mg/dL (ref 8.9–10.3)
Chloride: 106 mmol/L (ref 101–111)
Creatinine, Ser: 0.82 mg/dL (ref 0.44–1.00)
GFR calc Af Amer: >60 mL/min (ref >60)
GFR calc non Af Amer: >60 mL/min (ref >60)
Glucose, Bld: 115 mg/dL — ABNORMAL HIGH (ref 65–99)
Potassium: 3.6 mmol/L (ref 3.5–5.1)
Sodium: 136 mmol/L (ref 135–145)

## 2015-03-21 LAB — URINALYSIS COMPLETE WITH MICROSCOPIC (ARMC ONLY)
Bacteria, UA: NONE SEEN
Bilirubin Urine: NEGATIVE
Glucose, UA: NEGATIVE mg/dL
Ketones, ur: NEGATIVE mg/dL
Nitrite: NEGATIVE
Protein, ur: NEGATIVE mg/dL
Specific Gravity, Urine: 1.027 (ref 1.005–1.030)
pH: 6 (ref 5.0–8.0)

## 2015-03-21 LAB — CBC
HCT: 39.1 % (ref 35.0–47.0)
Hemoglobin: 13 g/dL (ref 12.0–16.0)
MCH: 26.7 pg (ref 26.0–34.0)
MCHC: 33.3 g/dL (ref 32.0–36.0)
MCV: 80.1 fL (ref 80.0–100.0)
Platelets: 315 10*3/uL (ref 150–440)
RBC: 4.89 MIL/uL (ref 3.80–5.20)
RDW: 14.5 % (ref 11.5–14.5)
WBC: 10.9 10*3/uL (ref 3.6–11.0)

## 2015-03-21 LAB — PREGNANCY, URINE: Preg Test, Ur: NEGATIVE

## 2015-03-21 MED ORDER — KETOROLAC TROMETHAMINE 30 MG/ML IJ SOLN
30.0000 mg | Freq: Once | INTRAMUSCULAR | Status: AC
  Administered 2015-03-21: 30 mg via INTRAVENOUS
  Filled 2015-03-21: qty 1

## 2015-03-21 MED ORDER — PHENAZOPYRIDINE HCL 200 MG PO TABS: 200.0000 mg | ORAL_TABLET | Freq: Three times a day (TID) | ORAL | Status: AC | PRN

## 2015-03-21 MED ORDER — HYDROMORPHONE HCL 1 MG/ML IJ SOLN
1.0000 mg | Freq: Once | INTRAMUSCULAR | Status: AC
  Administered 2015-03-21: 1 mg via INTRAVENOUS
  Filled 2015-03-21: qty 1

## 2015-03-21 MED ORDER — SULFAMETHOXAZOLE-TRIMETHOPRIM 800-160 MG PO TABS: 1.0000 | ORAL_TABLET | Freq: Two times a day (BID) | ORAL | Status: DC

## 2015-03-21 MED ORDER — SODIUM CHLORIDE 0.9 % IV SOLN
Freq: Once | INTRAVENOUS | Status: AC
  Administered 2015-03-21: 20:00:00 via INTRAVENOUS

## 2015-03-21 MED ORDER — CEFTRIAXONE SODIUM 1 G IJ SOLR
1.0000 g | Freq: Once | INTRAMUSCULAR | Status: AC
  Administered 2015-03-21: 1 g via INTRAVENOUS
  Filled 2015-03-21 (×2): qty 10

## 2015-03-21 NOTE — ED Notes (Signed)
Lab called for urine culture and preg

## 2015-03-21 NOTE — ED Notes (Signed)
Pt states her mother is outside in parking lot to driver her home

## 2015-03-21 NOTE — ED Notes (Signed)
Pt comes into the ED via POV c/o right sided flank pain and blood in her urine.  [facility] where they found blood in her urine.  Patient denies any other problems at this time and is in no apparent distress. Patient denies any h/o kidney stones, difficulty urinating, burning, fevers or chills.  [facility] with [facility] today.

## 2015-03-21 NOTE — ED Provider Notes (Signed)
[facility] Emergency Department Provider Note     Time seen: ----------------------------------------- 6:38 PM on 03/21/2015 -----------------------------------------    I have reviewed the triage vital signs and the nursing notes.   HISTORY  Chief Complaint Flank Pain    HPI Emma Fowler is a 30 y.o. female who presents to ER for flank pain and blood in her urine. She was seen in the [facility] today and they found blood in her urine. She does have a headache, she feels hot but does not have any other complaints at this time. Patient denies any vaginal bleeding or discharge, denies she could have an STD. Past Medical History  Diagnosis Date  . Depression     There are no active problems to display for this patient.   Past Surgical History  Procedure Laterality Date  . Dilation and curettage of uterus    . Tubal ligation    . Tonsillectomy      Allergies Review of patient's allergies indicates no known allergies.  Social History Social History  Substance Use Topics  . Smoking status: Former Smoker -- 0.50 packs/day    Types: Cigarettes  . Smokeless tobacco: None  . Alcohol Use: No    Review of Systems Constitutional: Negative for fever. Eyes: Negative for visual changes. ENT: Negative for sore throat. Cardiovascular: Negative for chest pain. Respiratory: Negative for shortness of breath. Gastrointestinal: Positive for flank pain Genitourinary: Positive for hematuria Musculoskeletal: Negative for back pain. Skin: Negative for rash. Neurological: Positive for headache, negative for weakness or numbness  10-point ROS otherwise negative.  ____________________________________________   PHYSICAL EXAM:  VITAL SIGNS: ED Triage Vitals  Enc Vitals Group     BP 03/21/15 1741 131/64 mmHg     Pulse Rate 03/21/15 1741 89     Resp 03/21/15 1741 20     Temp 03/21/15 1741 98.2 F (36.8 C)     Temp Source 03/21/15 1741 Oral      SpO2 03/21/15 1741 98 %     Weight 03/21/15 1741 265 lb (120.203 kg)     Height 03/21/15 1741 5\' 4"  (1.626 m)     Head Cir --      Peak Flow --      Pain Score 03/21/15 1744 9     Pain Loc --      Pain Edu? --      Excl. in GC? --     Constitutional: Alert and oriented. Well appearing and in no distress. Eyes: Conjunctivae are normal. PERRL. Normal extraocular movements. ENT   Head: Normocephalic and atraumatic.   Nose: No congestion/rhinnorhea.   Mouth/Throat: Mucous membranes are moist.   Neck: No stridor. Cardiovascular: Normal rate, regular rhythm. Normal and symmetric distal pulses are present in all extremities. No murmurs, rubs, or gallops. Respiratory: Normal respiratory effort without tachypnea nor retractions. Breath sounds are clear and equal bilaterally. No wheezes/rales/rhonchi. Gastrointestinal: Right flank tenderness, no rebound or guarding. Normal bowel sounds..  Musculoskeletal: Nontender with normal range of motion in all extremities. No joint effusions.  No lower extremity tenderness nor edema. Neurologic:  Normal speech and language. No gross focal neurologic deficits are appreciated. Speech is normal. No gait instability. Skin:  Skin is warm, dry and intact. No rash noted. Psychiatric: Mood and affect are normal. Speech and behavior are normal. Patient exhibits appropriate insight and judgment. ____________________________________________  ED COURSE:  Pertinent labs & imaging results that were available during my care of the patient were reviewed by me  and considered in my medical decision making (see chart for details). Patient is in no acute distress, likely pyelonephritis or renal colic. ____________________________________________    LABS (pertinent positives/negatives)  Labs Reviewed  BASIC METABOLIC PANEL - Abnormal; Notable for the following:    Glucose, Bld 115 (*)    Anion gap 4 (*)    All other components within normal limits   URINALYSIS COMPLETEWITH MICROSCOPIC ([facility] ONLY) - Abnormal; Notable for the following:    Color, Urine YELLOW (*)    APPearance HAZY (*)    Hgb urine dipstick 1+ (*)    Leukocytes, UA 1+ (*)    Squamous Epithelial / LPF 6-30 (*)    All other components within normal limits  URINE CULTURE  CBC  PREGNANCY, URINE    RADIOLOGY Images were viewed by me  CT renal protocol IMPRESSION: 1. No renal stones or obstructive uropathy. 2. Small hiatal hernia, with minimal increase in size over the past 4 years. 3. 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 No suspicious characteristics or active inflammation. ____________________________________________  FINAL ASSESSMENT AND PLAN  Flank pain, cystitis  Plan: Patient with labs and imaging as dictated above. Unclear reason for multiple recent UTIs. Patient denies any vaginal complaints. She is given IV Rocephin, will be discharged with Septra which the staph aureus she had last time seemed to be susceptible to. Otherwise return for worsening or worrisome symptoms.   Emily Filbert, MD   Emily Filbert, MD 03/21/15 [phone]

## 2015-03-21 NOTE — Discharge Instructions (Signed)
Flank Pain °Flank pain refers to pain that is located on the side of the body between the upper abdomen and the back. The pain may occur over a short period of time (acute) or may be long-term or reoccurring (chronic). It may be mild or severe. Flank pain can be caused by many things. °CAUSES  °Some of the more common causes of flank pain include: °· Muscle strains.   °· Muscle spasms.   °· A disease of your spine (vertebral disk disease).   °· A lung infection (pneumonia).   °· Fluid around your lungs (pulmonary edema).   °· A kidney infection.   °· Kidney stones.   °· A very painful skin rash caused by the chickenpox virus (shingles).   °· Gallbladder disease.   °HOME CARE INSTRUCTIONS  °Home care will depend on the cause of your pain. In general, °· Rest as directed by your caregiver. °· Drink enough fluids to keep your urine clear or pale yellow. °· Only take over-the-counter or prescription medicines as directed by your caregiver. Some medicines may help relieve the pain. °· Tell your caregiver about any changes in your pain. °· Follow up with your caregiver as directed. °SEEK IMMEDIATE MEDICAL CARE IF:  °· Your pain is not controlled with medicine.   °· You have new or worsening symptoms. °· Your pain increases.   °· You have abdominal pain.   °· You have shortness of breath.   °· You have persistent nausea or vomiting.   °· You have swelling in your abdomen.   °· You feel faint or pass out.   °· You have blood in your urine. °· You have a fever or persistent symptoms for more than 2-3 days. °· You have a fever and your symptoms suddenly get worse. °MAKE SURE YOU:  °· Understand these instructions. °· Will watch your condition. °· Will get help right away if you are not doing well or get worse. °  °This information is not intended to replace advice given to you by your health care provider. Make sure you discuss any questions you have with your health care provider. °  °Document Released: 04/17/2005 Document  Revised: 11/19/2011 Document Reviewed: 10/09/2011 °Elsevier Interactive Patient Education ©2016 [employer] ° °Urinary Tract Infection °Urinary tract infections (UTIs) can develop anywhere along your urinary tract. Your urinary tract is your body's drainage system for removing wastes and extra water. Your urinary tract includes two kidneys, two ureters, a bladder, and a urethra. Your kidneys are a pair of bean-shaped organs. Each kidney is about the size of your fist. They are located below your ribs, one on each side of your spine. °CAUSES °Infections are caused by microbes, which are microscopic organisms, including fungi, viruses, and bacteria. These organisms are so small that they can only be seen through a microscope. Bacteria are the microbes that most commonly cause UTIs. °SYMPTOMS  °Symptoms of UTIs may vary by age and gender of the patient and by the location of the infection. Symptoms in young women typically include a frequent and intense urge to urinate and a painful, burning feeling in the bladder or urethra during urination. Older women and men are more likely to be tired, shaky, and weak and have muscle aches and abdominal pain. A fever may mean the infection is in your kidneys. Other symptoms of a kidney infection include pain in your back or sides below the ribs, nausea, and vomiting. °DIAGNOSIS °To diagnose a UTI, your caregiver will ask you about your symptoms. Your caregiver will also ask you to provide a urine sample. The   urine sample will be tested for bacteria and white blood cells. White blood cells are made by your body to help fight infection. °TREATMENT  °Typically, UTIs can be treated with medication. Because most UTIs are caused by a bacterial infection, they usually can be treated with the use of antibiotics. The choice of antibiotic and length of treatment depend on your symptoms and the type of bacteria causing your infection. °HOME CARE INSTRUCTIONS °· If you were prescribed  antibiotics, take them exactly as your caregiver instructs you. Finish the medication even if you feel better after you have only taken some of the medication. °· Drink enough water and fluids to keep your urine clear or pale yellow. °· Avoid caffeine, tea, and carbonated beverages. They tend to irritate your bladder. °· Empty your bladder often. Avoid holding urine for long periods of time. °· Empty your bladder before and after sexual intercourse. °· After a bowel movement, women should cleanse from front to back. Use each tissue only once. °SEEK MEDICAL CARE IF:  °· You have back pain. °· You develop a fever. °· Your symptoms do not begin to resolve within 3 days. °SEEK IMMEDIATE MEDICAL CARE IF:  °· You have severe back pain or lower abdominal pain. °· You develop chills. °· You have nausea or vomiting. °· You have continued burning or discomfort with urination. °MAKE SURE YOU:  °· Understand these instructions. °· Will watch your condition. °· Will get help right away if you are not doing well or get worse. °  °This information is not intended to replace advice given to you by your health care provider. Make sure you discuss any questions you have with your health care provider. °  °Document Released: 12/04/2004 Document Revised: 11/15/2014 Document Reviewed: 04/04/2011 °Elsevier Interactive Patient Education ©2016 [employer] Inc[employer] ° °

## 2015-03-21 NOTE — ED Notes (Signed)
Patient transported to X-ray 

## 2015-03-23 LAB — URINE CULTURE: Special Requests: NORMAL

## 2015-04-02 ENCOUNTER — Ambulatory Visit: Payer: Self-pay | Admitting: Obstetrics and Gynecology

## 2015-04-03 ENCOUNTER — Encounter: Payer: Self-pay | Admitting: *Deleted

## 2015-04-04 ENCOUNTER — Ambulatory Visit: Payer: Self-pay | Admitting: Obstetrics and Gynecology

## 2015-04-04 ENCOUNTER — Encounter: Payer: Self-pay | Admitting: Obstetrics and Gynecology

## 2015-08-05 ENCOUNTER — Emergency Department: Payer: Medicaid Other

## 2015-08-05 ENCOUNTER — Encounter: Payer: Self-pay | Admitting: Emergency Medicine

## 2015-08-05 ENCOUNTER — Emergency Department
Admission: EM | Admit: 2015-08-05 | Discharge: 2015-08-05 | Disposition: A | Discharge status: Home / Self Care | Payer: Medicaid Other | Attending: Emergency Medicine | Admitting: Emergency Medicine

## 2015-08-05 DIAGNOSIS — Z87891 Personal history of nicotine dependence: Secondary | ICD-10-CM | POA: Insufficient documentation

## 2015-08-05 DIAGNOSIS — F329 Major depressive disorder, single episode, unspecified: Secondary | ICD-10-CM | POA: Insufficient documentation

## 2015-08-05 DIAGNOSIS — S82151A Displaced fracture of right tibial tuberosity, initial encounter for closed fracture: Secondary | ICD-10-CM | POA: Diagnosis not present

## 2015-08-05 DIAGNOSIS — Y92410 Unspecified street and highway as the place of occurrence of the external cause: Secondary | ICD-10-CM | POA: Diagnosis not present

## 2015-08-05 DIAGNOSIS — Y999 Unspecified external cause status: Secondary | ICD-10-CM | POA: Diagnosis not present

## 2015-08-05 DIAGNOSIS — Z79899 Other long term (current) drug therapy: Secondary | ICD-10-CM | POA: Diagnosis not present

## 2015-08-05 DIAGNOSIS — M25561 Pain in right knee: Secondary | ICD-10-CM | POA: Diagnosis present

## 2015-08-05 DIAGNOSIS — Y9389 Activity, other specified: Secondary | ICD-10-CM | POA: Insufficient documentation

## 2015-08-05 DIAGNOSIS — S82153A Displaced fracture of unspecified tibial tuberosity, initial encounter for closed fracture: Secondary | ICD-10-CM

## 2015-08-05 MED ORDER — MELOXICAM 15 MG PO TABS: 15.0000 mg | ORAL_TABLET | Freq: Every day | ORAL | Status: DC
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
## 2015-08-05 NOTE — Discharge Instructions (Signed)
Tibial Fracture, Adult  A tibial fracture is a break in the larger bone of your lower leg (tibia). This bone is also called the shin bone.  CAUSES   · Low-energy injuries, such as a fall from ground level.    · High-energy injuries, such as motor vehicle injuries or high-speed sports collisions.  RISK FACTORS  · Jumping activities.    · Repetitive stress, such as long-distance running.    · Participation in sports.    · Osteoporosis.    · Advanced age.    SIGNS AND SYMPTOMS  · Pain.    · Swelling.    · Inability to put weight on your injured leg.    · Bone deformities at the site of your injury.    · Bruising.    DIAGNOSIS   A tibial fracture can usually be diagnosed using X-rays.  TREATMENT   A tibial fracture will often be treated with simple immobilization. A cast or splint will be used on your leg to keep it from moving while it heals. If the injury caused parts of the bone to move out of place, your health care provider may reposition those parts before putting on your cast or splint. The cast or splint will remain in place until your health care provider thinks the bone has healed well enough. Then you can begin range-of-motion exercises to regain your knee motion. For severe injuries, surgery is sometimes needed to insert plates or screws into the injured area.  HOME CARE INSTRUCTIONS   · If you have a plaster or fiberglass cast:      Do not try to scratch the skin under the cast using sharp or pointed objects.      Check the skin around the cast every day. You may put lotion on any red or sore areas.      Keep your cast dry and clean.    · If you have a plaster splint:      Wear the splint as directed.      Loosen the elastic around the splint if your toes become numb, tingle, or turn cold or blue.    · Do not put pressure on any part of your cast or splint until it is fully hardened.    · Use a plastic bag to protect your cast or splint during bathing. Do not lower the cast or splint into water.    · Use  crutches as directed.    · Take medicines only as directed by your health care provider.    · Keep all follow-up visits as directed by your health care provider. This is important.    SEEK MEDICAL CARE IF:  · Your pain is becoming worse rather than better or is not controlled with medicines.    · You have increased swelling or redness in your foot.    · You begin to lose feeling in your foot or toes.    SEEK IMMEDIATE MEDICAL CARE IF:   · Your foot or toes on the injured side feel cold or turn blue.    · You develop severe pain in your injured leg, especially if the pain is increased with movement of your toes.    MAKE SURE YOU:  · Understand these instructions.    · Will watch your condition.    · Will get help right away if you are not doing well or get worse.       This information is not intended to replace advice given to you by your health care provider. Make sure you discuss any questions you have with your health care provider.     Document Released: 11/19/2000 Document   Revised: 07/11/2014 Document Reviewed: 04/20/2013  Elsevier Interactive Patient Education ©2016 [employer]

## 2015-08-29 ENCOUNTER — Emergency Department: Payer: Medicaid Other

## 2015-08-29 ENCOUNTER — Emergency Department
Admission: EM | Admit: 2015-08-29 | Discharge: 2015-08-29 | Disposition: A | Discharge status: Home / Self Care | Payer: Medicaid Other | Attending: Student | Admitting: Student

## 2015-08-29 ENCOUNTER — Encounter: Payer: Self-pay | Admitting: Emergency Medicine

## 2015-08-29 DIAGNOSIS — Z87891 Personal history of nicotine dependence: Secondary | ICD-10-CM | POA: Diagnosis not present

## 2015-08-29 DIAGNOSIS — R110 Nausea: Secondary | ICD-10-CM | POA: Diagnosis not present

## 2015-08-29 DIAGNOSIS — R1032 Left lower quadrant pain: Secondary | ICD-10-CM | POA: Diagnosis not present

## 2015-08-29 DIAGNOSIS — K5900 Constipation, unspecified: Secondary | ICD-10-CM | POA: Diagnosis not present

## 2015-08-29 DIAGNOSIS — F329 Major depressive disorder, single episode, unspecified: Secondary | ICD-10-CM | POA: Insufficient documentation

## 2015-08-29 DIAGNOSIS — R52 Pain, unspecified: Secondary | ICD-10-CM

## 2015-08-29 LAB — CBC
HCT: 39.2 % (ref 35.0–47.0)
Hemoglobin: 12.9 g/dL (ref 12.0–16.0)
MCH: 26 pg (ref 26.0–34.0)
MCHC: 33 g/dL (ref 32.0–36.0)
MCV: 78.7 fL — ABNORMAL LOW (ref 80.0–100.0)
Platelets: 306 10*3/uL (ref 150–440)
RBC: 4.98 MIL/uL (ref 3.80–5.20)
RDW: 14.5 % (ref 11.5–14.5)
WBC: 13.8 10*3/uL — ABNORMAL HIGH (ref 3.6–11.0)

## 2015-08-29 LAB — COMPREHENSIVE METABOLIC PANEL
ALT: 24 U/L (ref 14–54)
AST: 25 U/L (ref 15–41)
Albumin: 4.4 g/dL (ref 3.5–5.0)
Alkaline Phosphatase: 88 U/L (ref 38–126)
Anion gap: 8 (ref 5–15)
BUN: 19 mg/dL (ref 6–20)
CO2: 29 mmol/L (ref 22–32)
Calcium: 9.8 mg/dL (ref 8.9–10.3)
Chloride: 101 mmol/L (ref 101–111)
Creatinine, Ser: 0.65 mg/dL (ref 0.44–1.00)
GFR calc Af Amer: >60 mL/min (ref >60)
GFR calc non Af Amer: >60 mL/min (ref >60)
Glucose, Bld: 90 mg/dL (ref 65–99)
Potassium: 4 mmol/L (ref 3.5–5.1)
Sodium: 138 mmol/L (ref 135–145)
Total Bilirubin: 0.3 mg/dL (ref 0.3–1.2)
Total Protein: 7.8 g/dL (ref 6.5–8.1)

## 2015-08-29 LAB — URINALYSIS COMPLETE WITH MICROSCOPIC (ARMC ONLY)
Bacteria, UA: NONE SEEN
Bilirubin Urine: NEGATIVE
Glucose, UA: NEGATIVE mg/dL
Hgb urine dipstick: NEGATIVE
Ketones, ur: NEGATIVE mg/dL
Nitrite: NEGATIVE
Protein, ur: NEGATIVE mg/dL
Specific Gravity, Urine: 1.017 (ref 1.005–1.030)
pH: 7 (ref 5.0–8.0)

## 2015-08-29 LAB — WET PREP, GENITAL
Sperm: NONE SEEN
Trich, Wet Prep: NONE SEEN
Yeast Wet Prep HPF POC: NONE SEEN

## 2015-08-29 LAB — CHLAMYDIA/NGC RT PCR (ARMC ONLY)
Chlamydia Tr: NOT DETECTED
N gonorrhoeae: NOT DETECTED

## 2015-08-29 LAB — POCT PREGNANCY, URINE: Preg Test, Ur: NEGATIVE

## 2015-08-29 LAB — LIPASE, BLOOD: Lipase: 21 U/L (ref 11–51)

## 2015-08-29 MED ORDER — POLYETHYLENE GLYCOL 3350 17 GM/SCOOP PO POWD: ORAL | Status: DC

## 2015-08-29 MED ORDER — MORPHINE SULFATE (PF) 4 MG/ML IV SOLN
4.0000 mg | Freq: Once | INTRAVENOUS | Status: AC
  Administered 2015-08-29: 4 mg via INTRAVENOUS

## 2015-08-29 MED ORDER — KETOROLAC TROMETHAMINE 30 MG/ML IJ SOLN
15.0000 mg | Freq: Once | INTRAMUSCULAR | Status: AC
  Administered 2015-08-29: 15 mg via INTRAMUSCULAR
  Filled 2015-08-29: qty 1

## 2015-08-29 MED ORDER — MAGNESIUM CITRATE PO SOLN: 1.0000 | Freq: Once | ORAL | Status: DC

## 2015-08-29 MED ORDER — ONDANSETRON HCL 4 MG/2ML IJ SOLN
4.0000 mg | Freq: Once | INTRAMUSCULAR | Status: AC
  Administered 2015-08-29: 4 mg via INTRAVENOUS

## 2015-08-29 MED ORDER — IOPAMIDOL (ISOVUE-300) INJECTION 61%
100.0000 mL | Freq: Once | INTRAVENOUS | Status: AC | PRN
  Administered 2015-08-29: 100 mL via INTRAVENOUS

## 2015-08-29 MED ORDER — SODIUM CHLORIDE 0.9 % IV BOLUS (SEPSIS)
500.0000 mL | Freq: Once | INTRAVENOUS | Status: AC
  Administered 2015-08-29: 500 mL via INTRAVENOUS

## 2015-08-29 MED ORDER — IBUPROFEN 400 MG PO TABS: 400.0000 mg | ORAL_TABLET | Freq: Four times a day (QID) | ORAL | Status: DC | PRN

## 2015-08-29 MED ORDER — OXYCODONE HCL 5 MG PO TABS
10.0000 mg | ORAL_TABLET | Freq: Once | ORAL | Status: AC
  Administered 2015-08-29: 10 mg via ORAL
  Filled 2015-08-29: qty 2

## 2015-08-29 MED ORDER — ONDANSETRON HCL 4 MG/2ML IJ SOLN
INTRAMUSCULAR | Status: AC
  Administered 2015-08-29: 4 mg via INTRAVENOUS
  Filled 2015-08-29: qty 2

## 2015-08-29 MED ORDER — MORPHINE SULFATE (PF) 4 MG/ML IV SOLN
INTRAVENOUS | Status: AC
  Administered 2015-08-29: 4 mg via INTRAVENOUS
  Filled 2015-08-29: qty 1

## 2015-08-29 MED ORDER — DIATRIZOATE MEGLUMINE & SODIUM 66-10 % PO SOLN
15.0000 mL | Freq: Once | ORAL | Status: AC
  Administered 2015-08-29: 15 mL via ORAL

## 2015-08-29 NOTE — ED Provider Notes (Signed)
[facility] Emergency Department Provider Note   ____________________________________________  Time seen: Approximately 1:50 AM  I have reviewed the triage vital signs and the nursing notes.   HISTORY  Chief Complaint Abdominal Pain    HPI Emma Fowler is a 30 y.o. female with history of depression who presents for evaluation of 2-3 days left lower abdominal pain radiating to her back, gradual onset, constant, no modifying factors, currently moderate. She also has had nausea but no vomiting or diarrhea. No fevers or chills. She denies any abnormal vaginal bleeding or vaginal discharge. No dysuria or hematuria. No chest pain or difficulty breathing.   Past Medical History  Diagnosis Date  . Depression   . Recurrent UTI     There are no active problems to display for this patient.   Past Surgical History  Procedure Laterality Date  . Dilation and curettage of uterus    . Tubal ligation    . Tonsillectomy      Current Outpatient Rx  Name  Route  Sig  Dispense  Refill  . ciprofloxacin (CIPRO) 500 MG tablet   Oral   Take 1 tablet (500 mg total) by mouth 2 (two) times daily.   20 tablet   0   . EPINEPHrine (EPIPEN 2-PAK) 0.3 mg/0.3 mL IJ SOAJ injection   Intramuscular   Inject 0.3 mLs (0.3 mg total) into the muscle once.   1 Device   0   . HYDROcodone-acetaminophen (NORCO/VICODIN) 5-325 MG tablet   Oral   Take 1 tablet by mouth every 4 (four) hours as needed for moderate pain.   20 tablet   0   . meloxicam (MOBIC) 15 MG tablet   Oral   Take 1 tablet (15 mg total) by mouth daily.   30 tablet   0   . phenazopyridine (PYRIDIUM) 200 MG tablet   Oral   Take 1 tablet (200 mg total) by mouth 3 (three) times daily as needed for pain.   20 tablet   0   . predniSONE (DELTASONE) 20 MG tablet   Oral   Take 3 tablets (60 mg total) by mouth daily with breakfast.   15 tablet   0   . sulfamethoxazole-trimethoprim (BACTRIM DS)  800-160 MG tablet   Oral   Take 1 tablet by mouth 2 (two) times daily.   6 tablet   0     Allergies Review of patient's allergies indicates no known allergies.  Family History  Problem Relation Age of Onset  . ADD / ADHD Son     Social History Social History  Substance Use Topics  . Smoking status: Former Smoker -- 0.50 packs/day    Types: Cigarettes  . Smokeless tobacco: None  . Alcohol Use: No    Review of Systems Constitutional: No fever/chills Eyes: No visual changes. ENT: No sore throat. Cardiovascular: Denies chest pain. Respiratory: Denies shortness of breath. Gastrointestinal: + abdominal pain.  + nausea, no vomiting.  No diarrhea.  No constipation. Genitourinary: Negative for dysuria. Musculoskeletal: Positive for left back pain. Skin: Negative for rash. Neurological: Negative for headaches, focal weakness or numbness.  10-point ROS otherwise negative.  ____________________________________________   PHYSICAL EXAM:  Filed Vitals:   08/29/15 0034 08/29/15 0413 08/29/15 0614  BP: 129/84 140/75 128/77  Pulse: 90 78 82  Temp: 98.2 F (36.8 C)    TempSrc: Oral    Resp: Height:  (1.626 m)    Weight: 270 lb (  122.471 kg)    SpO2: 97% 99% 98%    VITAL SIGNS: ED Triage Vitals  Enc Vitals Group     BP 08/29/15 0034 129/84 mmHg     Pulse Rate 08/29/15 0034 90     Resp 08/29/15 0034 20     Temp 08/29/15 0034 98.2 F (36.8 C)     Temp Source 08/29/15 0034 Oral     SpO2 08/29/15 0034 97 %     Weight 08/29/15 0034 270 lb (122.471 kg)     Height 08/29/15 0034 5\' 4"  (1.626 m)     Head Cir --      Peak Flow --      Pain Score 08/29/15 0033 10     Pain Loc --      Pain Edu? --      Excl. in GC? --     Constitutional: Alert and oriented. In mild distress due to pain. Eyes: Conjunctivae are normal. PERRL. EOMI. Head: Atraumatic. Nose: No congestion/rhinnorhea. Mouth/Throat: Mucous membranes are moist.  Oropharynx  non-erythematous. Neck: No stridor. Supple without meningismus. Cardiovascular: Normal rate, regular rhythm. Grossly normal heart sounds.  Good peripheral circulation. Respiratory: Normal respiratory effort.  No retractions. Lungs CTAB. Gastrointestinal: Soft with mild to moderate tenderness throughout the lower abdomen, worse in the suprapubic region, the right lower abdomen and left lower abdomen. No CVA tenderness. Pelvic: minimal amount of thin non-malodorous vaginal discharge, no bimanual or cervical motion tenderness, no blood. Musculoskeletal: No lower extremity tenderness nor edema.  No joint effusions. Neurologic:  Normal speech and language. No gross focal neurologic deficits are appreciated. No gait instability. Skin:  Skin is warm, dry and intact. No rash noted. Psychiatric: Mood and affect are normal. Speech and behavior are normal.  ____________________________________________   LABS (all labs ordered are listed, but only abnormal results are displayed)  Labs Reviewed  WET PREP, GENITAL - Abnormal; Notable for the following:    Clue Cells Wet Prep HPF POC MODERATE (*)    WBC, Wet Prep HPF POC FEW (*)    All other components within normal limits  CBC - Abnormal; Notable for the following:    WBC 13.8 (*)    MCV 78.7 (*)    All other components within normal limits  URINALYSIS COMPLETEWITH MICROSCOPIC ([facility] ONLY) - Abnormal; Notable for the following:    Color, Urine YELLOW (*)    APPearance CLOUDY (*)    Leukocytes, UA TRACE (*)    Squamous Epithelial / LPF 0-5 (*)    All other components within normal limits  CHLAMYDIA/NGC RT PCR ([facility] ONLY)  LIPASE, BLOOD  COMPREHENSIVE METABOLIC PANEL  POC URINE PREG, ED  POCT PREGNANCY, URINE   ____________________________________________  EKG  none ____________________________________________  RADIOLOGY  Transvaginal ultrasound IMPRESSION: Unremarkable pelvic ultrasound. No evidence for ovarian  torsion.  CT abdomen and pelvis IMPRESSION: Moderate amount of retained large bowel stool without bowel obstruction or acute intra-abdominal/ pelvic process.  Moderate hiatal hernia. ____________________________________________   PROCEDURES  Procedure(s) performed: None  Critical Care performed: No  ____________________________________________   INITIAL IMPRESSION / ASSESSMENT AND PLAN / ED COURSE  Pertinent labs & imaging results that were available during my care of the patient were reviewed by me and considered in my medical decision making (see chart for details).  Emma Fowler is a 30 y.o. female with history of depression who presents for evaluation of 2-3 days left lower abdominal pain radiating to her back. On exam, she is in mild distress  due to pain. Her vital signs are stable, she is afebrile. She does have mild diffuse tenderness to palpation throughout the lower abdomen, no rebound or guarding. CMP unremarkable, normal lipase, negative pregnancy test. CBC shows mild leukocytosis, white blood cell count is 13.8. Urinalysis with urinary tract infection. We'll perform pelvic exam, obtain ultrasound of the pelvis to evaluate for acute ovarian pathology, rule out torsion. We'll treat her pain and reassess for disposition.  ----------------------------------------- 4:36 AM on 08/29/2015 ----------------------------------------- Unremarkable pelvic ultrasound, patient with continued pain. We'll obtain CT of the abdomen and pelvis. Pelvic exam shows a minimal amount of thin non-malodorous vaginal discharge, no bimanual or cervical motion tenderness.  ----------------------------------------- 6:19 AM on 08/29/2015 ----------------------------------------- The patient is much more comfortable appearing at this time. CT scan of the abdomen and pelvis shows only moderate amount of retained stool throughout the large bowel, no obstruction or acute intra-abdominal/intrapelvic  process. GC chlamydia pending. We'll DC with return precautions, MiraLAX, magnesium citrate. We discussed need for close PCP follow-up and she is comfortable with the discharge plan. DC home.  ____________________________________________   FINAL CLINICAL IMPRESSION(S) / ED DIAGNOSES  Final diagnoses:  Pain  LLQ abdominal pain  Constipation, unspecified constipation type      NEW MEDICATIONS STARTED DURING THIS VISIT:  New Prescriptions   No medications on file     Note:  This document was prepared using Dragon voice recognition software and may include unintentional dictation errors.    Gayla Doss, MD 08/29/15 [id number]

## 2015-08-29 NOTE — ED Notes (Signed)
Patient ambulatory to triage with steady gait, without difficulty or distress noted; pt st x 3 days left lower abd pain radiating into back accomp by nausea; st hx of same with UTI

## 2015-08-30 LAB — URINE CULTURE: Culture: >=100000 — AB

## 2015-11-04 ENCOUNTER — Emergency Department: Payer: Medicaid Other

## 2015-11-04 ENCOUNTER — Emergency Department
Admission: EM | Admit: 2015-11-04 | Discharge: 2015-11-04 | Disposition: A | Discharge status: Home / Self Care | Payer: Medicaid Other | Attending: Emergency Medicine | Admitting: Emergency Medicine

## 2015-11-04 ENCOUNTER — Encounter: Payer: Self-pay | Admitting: Emergency Medicine

## 2015-11-04 DIAGNOSIS — Z79899 Other long term (current) drug therapy: Secondary | ICD-10-CM | POA: Insufficient documentation

## 2015-11-04 DIAGNOSIS — J069 Acute upper respiratory infection, unspecified: Secondary | ICD-10-CM | POA: Insufficient documentation

## 2015-11-04 DIAGNOSIS — R05 Cough: Secondary | ICD-10-CM | POA: Diagnosis present

## 2015-11-04 DIAGNOSIS — Z87891 Personal history of nicotine dependence: Secondary | ICD-10-CM | POA: Insufficient documentation

## 2015-11-04 DIAGNOSIS — B9789 Other viral agents as the cause of diseases classified elsewhere: Secondary | ICD-10-CM

## 2015-11-04 HISTORY — DX: Bronchitis, not specified as acute or chronic: J40

## 2015-11-04 MED ORDER — BENZONATATE 100 MG PO CAPS: 100.0000 mg | ORAL_CAPSULE | Freq: Three times a day (TID) | ORAL | 0 refills | Status: DC | PRN

## 2015-11-04 MED ORDER — ALBUTEROL SULFATE HFA 108 (90 BASE) MCG/ACT IN AERS: INHALATION_SPRAY | RESPIRATORY_TRACT | 1 refills | Status: DC

## 2015-11-04 NOTE — ED Triage Notes (Signed)
Pt reports feeling bad x 1 week with nonproductive cough; throat burning; sinus drainage; says it feels like she has bronchitis again; quit smoking about 2 months ago; no OTC medications providing relief; afebrile; having hot/cold flashes

## 2015-11-04 NOTE — ED Provider Notes (Signed)
[facility] Emergency Department Provider Note  ____________________________________________   First MD Initiated Contact with Patient 11/04/15 0354     (approximate)  I have reviewed the triage vital signs and the nursing notes.   HISTORY  Chief Complaint Cough    HPI Emma Fowler is a 30 y.o. female with no chronic medical issues but who has had multiple episodes of bronchitis in the past and is a daily tobacco user who presents with gradual onset of URI symptoms including frequent dry cough, mild sore throat, nasal congestion, and "head stuffiness" .  She describes the symptoms as severe and nothing is making it better including multiple over-the-counter cold medicines.  Nothing is making it worse either.  She had stopped smoking about 2 months ago but she has started back up but less than usual.  She is not having any difficulty breathing.  She denies fever/chills or chest pain, nausea, vomiting, abdominal pain, dysuria.   Past Medical History:  Diagnosis Date  . Bronchitis   . Depression   . Recurrent UTI     There are no active problems to display for this patient.   Past Surgical History:  Procedure Laterality Date  . DILATION AND CURETTAGE OF UTERUS    . TONSILLECTOMY    . TUBAL LIGATION      Prior to Admission medications   Medication Sig Start Date End Date Taking? Authorizing Provider  albuterol (PROVENTIL HFA;VENTOLIN HFA) 108 (90 Base) MCG/ACT inhaler Inhale 4-6 puffs by mouth every 4 hours as needed for wheezing, cough, and/or shortness of breath 11/04/15   Loleta Rose, MD  benzonatate (TESSALON PERLES) 100 MG capsule Take 1 capsule (100 mg total) by mouth 3 (three) times daily as needed for cough. 11/04/15   Loleta Rose, MD  ciprofloxacin (CIPRO) 500 MG tablet Take 1 tablet (500 mg total) by mouth 2 (two) times daily. 03/07/15   Tommi Rumps, PA-C  EPINEPHrine (EPIPEN 2-PAK) 0.3 mg/0.3 mL IJ SOAJ injection Inject 0.3 mLs  (0.3 mg total) into the muscle once. 11/04/14   Darci Current, MD  HYDROcodone-acetaminophen (NORCO/VICODIN) 5-325 MG tablet Take 1 tablet by mouth every 4 (four) hours as needed for moderate pain. 03/07/15   Tommi Rumps, PA-C  ibuprofen (ADVIL,MOTRIN) 400 MG tablet Take 1 tablet (400 mg total) by mouth every 6 (six) hours as needed for moderate pain. 08/29/15   Gayla Doss, MD  magnesium citrate SOLN Take 296 mLs (1 Bottle total) by mouth once. 08/29/15   Gayla Doss, MD  meloxicam (MOBIC) 15 MG tablet Take 1 tablet (15 mg total) by mouth daily. 08/05/15   Delorise Royals Cuthriell, PA-C  phenazopyridine (PYRIDIUM) 200 MG tablet Take 1 tablet (200 mg total) by mouth 3 (three) times daily as needed for pain. 03/21/15 03/20/16  Emily Filbert, MD  polyethylene glycol powder [facility]) powder On the day after you do the magnesium citrate cleanout, start taking MiraLAX once a day. Dissolve 1 tablespoon in 4-8 ounces of juice or water and drink this once a day to prevent constipation. 08/29/15   Gayla Doss, MD  predniSONE (DELTASONE) 20 MG tablet Take 3 tablets (60 mg total) by mouth daily with breakfast. 11/04/14 11/04/15  Darci Current, MD  sulfamethoxazole-trimethoprim (BACTRIM DS) 800-160 MG tablet Take 1 tablet by mouth 2 (two) times daily. 03/21/15   Emily Filbert, MD    Allergies Review of patient's allergies indicates no known allergies.  Family History  Problem  Relation Age of Onset  . ADD / ADHD Son     Social History Social History  Substance Use Topics  . Smoking status: Former Smoker    Packs/day: 0.50    Types: Cigarettes  . Smokeless tobacco: Never Used  . Alcohol use No    Review of Systems Constitutional: No fever/chills Eyes: No visual changes. ENT: Mild sore throat.  Nasal congestion/runny nose.  "Head stuffiness." Cardiovascular: Denies chest pain. Respiratory: Denies shortness of breath. Frequent dry cough. Gastrointestinal: No abdominal pain.   No nausea, no vomiting.  No diarrhea.  No constipation. Genitourinary: Negative for dysuria. Musculoskeletal: Negative for back pain. Skin: Negative for rash. Neurological: Negative for headaches, focal weakness or numbness.  10-point ROS otherwise negative.  ____________________________________________   PHYSICAL EXAM:  VITAL SIGNS: ED Triage Vitals [11/04/15 0138]  Enc Vitals Group     BP 107/81     Pulse Rate 91     Resp 18     Temp 98.4 F (36.9 C)     Temp Source Oral     SpO2 100 %     Weight 265 lb (120.2 kg)     Height 5\' 4"  (1.626 m)     Head Circumference      Peak Flow      Pain Score 7     Pain Loc      Pain Edu?      Excl. in [place]?     Constitutional: Alert and oriented. Well appearing and in no acute distress. Eyes: Conjunctivae are normal. PERRL. EOMI. Head: Atraumatic. Nose: Nasal congestion without rhinnorhea Mouth/Throat: Mucous membranes are moist.  Oropharynx non-erythematous. Neck: No stridor.  No meningeal signs.   Cardiovascular: Normal rate, regular rhythm. Good peripheral circulation. Grossly normal heart sounds. Respiratory: Normal respiratory effort.  No retractions. Mild expiratory wheezing throughout Gastrointestinal: Soft and nontender. No distention.  Musculoskeletal: No lower extremity tenderness nor edema. No gross deformities of extremities. Neurologic:  Normal speech and language. No gross focal neurologic deficits are appreciated.  Skin:  Skin is warm, dry and intact. No rash noted. Psychiatric: Mood and affect are normal. Speech and behavior are normal.  ____________________________________________   LABS (all labs ordered are listed, but only abnormal results are displayed)  Labs Reviewed - No data to display ____________________________________________  EKG  None - EKG not ordered by ED physician ____________________________________________  RADIOLOGY   Dg Chest 2 View  Result Date: 11/04/2015 CLINICAL DATA:   Nonproductive cough for 1 week. EXAM: CHEST  2 VIEW COMPARISON:  04/30/2013 FINDINGS: The cardiomediastinal contours are normal. Hiatal hernia is noted. The lungs are clear. Pulmonary vasculature is normal. No consolidation, pleural effusion, or pneumothorax. No acute osseous abnormalities are seen. IMPRESSION: No active cardiopulmonary disease. Electronically Signed   By: Rubye Oaks M.D.   On: 11/04/2015 02:06    ____________________________________________   PROCEDURES  Procedure(s) performed:   Procedures   Critical Care performed: No ____________________________________________   INITIAL IMPRESSION / ASSESSMENT AND PLAN / ED COURSE  Pertinent labs & imaging results that were available during my care of the patient were reviewed by me and considered in my medical decision making (see chart for details).  Signs and symptoms are consistent with a viral syndrome.  To help with her cough/bronchospasm I will give her prescription for albuterol inhaler and some Tessalon Perles.  I had my usual customary symptomatic treatment and return precautions discussion.  She understands and agrees with the plan.    ____________________________________________  FINAL  CLINICAL IMPRESSION(S) / ED DIAGNOSES  Final diagnoses:  Viral URI with cough     MEDICATIONS GIVEN DURING THIS VISIT:  Medications - No data to display   NEW OUTPATIENT MEDICATIONS STARTED DURING THIS VISIT:  New Prescriptions   ALBUTEROL (PROVENTIL HFA;VENTOLIN HFA) 108 (90 BASE) MCG/ACT INHALER    Inhale 4-6 puffs by mouth every 4 hours as needed for wheezing, cough, and/or shortness of breath   BENZONATATE (TESSALON PERLES) 100 MG CAPSULE    Take 1 capsule (100 mg total) by mouth 3 (three) times daily as needed for cough.      Note:  This document was prepared using Dragon voice recognition software and may include unintentional dictation errors.    Loleta Roseory Sorin Frimpong, MD 11/04/15 [phone]

## 2015-11-07 ENCOUNTER — Encounter: Payer: Medicaid Other | Admitting: Obstetrics & Gynecology

## 2019-04-09 ENCOUNTER — Encounter: Payer: Self-pay | Admitting: Emergency Medicine

## 2019-04-09 ENCOUNTER — Other Ambulatory Visit: Payer: Self-pay

## 2019-04-09 ENCOUNTER — Emergency Department
Admission: EM | Admit: 2019-04-09 | Discharge: 2019-04-09 | Disposition: A | Discharge status: Home / Self Care | Payer: Medicaid Other | Attending: Emergency Medicine | Admitting: Emergency Medicine

## 2019-04-09 DIAGNOSIS — R05 Cough: Secondary | ICD-10-CM | POA: Diagnosis not present

## 2019-04-09 DIAGNOSIS — H938X1 Other specified disorders of right ear: Secondary | ICD-10-CM | POA: Insufficient documentation

## 2019-04-09 DIAGNOSIS — Z20822 Contact with and (suspected) exposure to covid-19: Secondary | ICD-10-CM | POA: Diagnosis not present

## 2019-04-09 DIAGNOSIS — J3489 Other specified disorders of nose and nasal sinuses: Secondary | ICD-10-CM

## 2019-04-09 DIAGNOSIS — R110 Nausea: Secondary | ICD-10-CM | POA: Diagnosis present

## 2019-04-09 DIAGNOSIS — R059 Cough, unspecified: Secondary | ICD-10-CM

## 2019-04-09 DIAGNOSIS — R0989 Other specified symptoms and signs involving the circulatory and respiratory systems: Secondary | ICD-10-CM | POA: Diagnosis not present

## 2019-04-09 DIAGNOSIS — Z87891 Personal history of nicotine dependence: Secondary | ICD-10-CM | POA: Insufficient documentation

## 2019-04-09 DIAGNOSIS — Z79899 Other long term (current) drug therapy: Secondary | ICD-10-CM | POA: Insufficient documentation

## 2019-04-09 MED ORDER — ONDANSETRON HCL 4 MG PO TABS: 4.0000 mg | ORAL_TABLET | Freq: Three times a day (TID) | ORAL | 0 refills | Status: DC | PRN

## 2019-04-09 MED ORDER — ONDANSETRON HCL 4 MG PO TABS
4.0000 mg | ORAL_TABLET | Freq: Once | ORAL | Status: AC
  Administered 2019-04-09: 4 mg via ORAL
  Filled 2019-04-09: qty 1

## 2019-04-09 NOTE — Discharge Instructions (Signed)
You were seen today for nausea. They will call you with the results of your Covid test. You should self quarantine until results are back. I have provided you with a RX for nausea medication, use as directed and be aware that this may cause sedation.

## 2019-04-09 NOTE — ED Triage Notes (Signed)
Pt presents to ER from home with complaints of nausea for a week reports she was exposed to someone who tested positive for Covid, denies any vomiting just nausea. Pt talks in complete sentences no respiratory distress noted.

## 2019-04-09 NOTE — ED Provider Notes (Signed)
[facility] Emergency Department Provider Note ____________________________________________  Time seen: 2040  I have reviewed the triage vital signs and the nursing notes.  HISTORY  Chief Complaint  Nausea   HPI Emma Fowler is a 34 y.o. female presents to the ER today with complaint of right ear fullness, runny nose, nasal congestion, cough and nausea.  She reports this started 1 week ago.  She denies ear pain, drainage or loss of hearing.  She is blowing yellow/green mucus out of her nose.  The cough is productive of green mucus at times.  She denies vomiting, diarrhea, constipation or blood in her stool.  She denies headache, loss of taste or smell, sore throat or shortness of breath.  She denies fever, chills or body aches.  She has not taken anything OTC for symptoms.  She has had positive current she has had positive exposure to COVID-19 She has had positive exposure to Covid 19, had a negative rapid test on Monday.   Past Medical History:  Diagnosis Date  . Bronchitis   . Depression   . Recurrent UTI     There are no problems to display for this patient.   Past Surgical History:  Procedure Laterality Date  . DILATION AND CURETTAGE OF UTERUS    . TONSILLECTOMY    . TUBAL LIGATION      Prior to Admission medications   Medication Sig Start Date End Date Taking? Authorizing Provider  EPINEPHrine (EPIPEN 2-PAK) 0.3 mg/0.3 mL IJ SOAJ injection Inject 0.3 mLs (0.3 mg total) into the muscle once. 11/04/14   Darci Current, MD  ibuprofen (ADVIL,MOTRIN) 400 MG tablet Take 1 tablet (400 mg total) by mouth every 6 (six) hours as needed for moderate pain. 08/29/15   Gayla Doss, MD  ondansetron (ZOFRAN) 4 MG tablet Take 1 tablet (4 mg total) by mouth every 8 (eight) hours as needed for nausea or vomiting. 04/09/19 04/08/20  Lorre Munroe, NP  polyethylene glycol powder (GLYCOLAX/MIRALAX) powder On the day after you do the magnesium citrate cleanout,  start taking MiraLAX once a day. Dissolve 1 tablespoon in 4-8 ounces of juice or water and drink this once a day to prevent constipation. 08/29/15   Gayla Doss, MD  albuterol (PROVENTIL HFA;VENTOLIN HFA) 108 (90 Base) MCG/ACT inhaler Inhale 4-6 puffs by mouth every 4 hours as needed for wheezing, cough, and/or shortness of breath 11/04/15 04/09/19  Loleta Rose, MD    Allergies Patient has no known allergies.  Family History  Problem Relation Age of Onset  . ADD / ADHD Son     Social History Social History   Tobacco Use  . Smoking status: Former Smoker    Packs/day: 0.50    Types: Cigarettes  . Smokeless tobacco: Never Used  Substance Use Topics  . Alcohol use: No  . Drug use: No    Review of Systems  Constitutional: Negative for fever, chills or body aches. Eyes: Negative for visual changes. ENT: Positive for runny nose, nasal congestion, right ear fullness. Negative for ear pain, loss of taste/smell or sore throat. Cardiovascular: Negative for chest pain or chest tightness. Respiratory: Positive for cough. Negative for shortness of breath. Gastrointestinal: Positive for nausea. Negative for abdominal pain, vomiting, constipation and diarrhea. Skin: Negative for rash.  ____________________________________________  PHYSICAL EXAM:  VITAL SIGNS: ED Triage Vitals  Enc Vitals Group     BP 04/09/19 1948 128/60     Pulse Rate 04/09/19 1948 78  Resp 04/09/19 1948 18     Temp 04/09/19 1948 98.6 F (37 C)     Temp Source 04/09/19 1948 Oral     SpO2 04/09/19 1948 100 %     Weight 04/09/19 1952 197 lb (89.4 kg)     Height 04/09/19 1952 5\' 4"  (1.626 m)     Head Circumference --      Peak Flow --      Pain Score 04/09/19 1952 0     Pain Loc --      Pain Edu? --      Excl. in [place]? --     Constitutional: Alert and oriented. Well appearing and in no distress. Head: Normocephalic and atraumatic. Eyes: Conjunctivae are normal. PERRL. Normal extraocular movements Ears:  Canals clear. TMs intact bilaterally. Mouth/Throat: Mucous membranes are moist. No posterior erythema or exudate noted.   Hematological/Lymphatic/Immunological: No cervical lymphadenopathy. Cardiovascular: Normal rate, regular rhythm.  Respiratory: Normal respiratory effort. No wheezes/rales/rhonchi. Gastrointestinal: Soft and nontender. No distention. Neurologic:   Normal speech and language. No gross focal neurologic deficits are appreciated. Skin:  Skin is warm, dry and intact. No rash noted.  ____________________________________________   LABS   Lab Orders     SARS CORONAVIRUS 2 (TAT 6-24 HRS) Nasopharyngeal Nasopharyngeal Swab   _____________________________________________  INITIAL IMPRESSION / ASSESSMENT AND PLAN / ED COURSE  Stuffy and Runny Nose, Right Ear Fullness, Cough, Nausea, Exposure to Covid 19:  Covid 19 swab sent off for further evaluation- she may have been tested too early Zofran 4 mg PO x 1 in ER Discussed symptomatic care: rest and fluids Advised Flonase OTC RX for Zofran 4 mgTID prn- sedation caution given Encouraged self quarantine until results are back Discussed frequent handwashing, masking and social distancing even in the home  Return ER precautions discussed ____________________________________________  FINAL CLINICAL IMPRESSION(S) / ED DIAGNOSES  Final diagnoses:  Stuffy and runny nose  Ear fullness, right  Cough  Nausea  Exposure to COVID-19 virus   Webb Silversmith, NP     Jearld Fenton, NP 04/09/19 2220    Drenda Freeze, MD 04/09/19 [phone]

## 2019-04-10 LAB — SARS CORONAVIRUS 2 (TAT 6-24 HRS): SARS Coronavirus 2: NEGATIVE

## 2019-04-26 ENCOUNTER — Other Ambulatory Visit: Payer: Self-pay

## 2019-04-26 ENCOUNTER — Emergency Department
Admission: EM | Admit: 2019-04-26 | Discharge: 2019-04-27 | Disposition: A | Discharge status: Home / Self Care | Payer: Medicaid Other | Attending: Emergency Medicine | Admitting: Emergency Medicine

## 2019-04-26 ENCOUNTER — Encounter: Payer: Self-pay | Admitting: Emergency Medicine

## 2019-04-26 DIAGNOSIS — R1084 Generalized abdominal pain: Secondary | ICD-10-CM | POA: Insufficient documentation

## 2019-04-26 DIAGNOSIS — Z79899 Other long term (current) drug therapy: Secondary | ICD-10-CM | POA: Insufficient documentation

## 2019-04-26 DIAGNOSIS — R1013 Epigastric pain: Secondary | ICD-10-CM | POA: Diagnosis present

## 2019-04-26 DIAGNOSIS — D509 Iron deficiency anemia, unspecified: Secondary | ICD-10-CM | POA: Diagnosis not present

## 2019-04-26 DIAGNOSIS — F17210 Nicotine dependence, cigarettes, uncomplicated: Secondary | ICD-10-CM | POA: Insufficient documentation

## 2019-04-26 LAB — URINALYSIS, COMPLETE (UACMP) WITH MICROSCOPIC
Bacteria, UA: NONE SEEN
Bilirubin Urine: NEGATIVE
Glucose, UA: NEGATIVE mg/dL
Ketones, ur: NEGATIVE mg/dL
Nitrite: NEGATIVE
Protein, ur: NEGATIVE mg/dL
RBC / HPF: >50 RBC/hpf — ABNORMAL HIGH (ref 0–5)
Specific Gravity, Urine: 1.019 (ref 1.005–1.030)
pH: 7 (ref 5.0–8.0)

## 2019-04-26 LAB — COMPREHENSIVE METABOLIC PANEL
ALT: 41 U/L (ref 0–44)
AST: 39 U/L (ref 15–41)
Albumin: 3.9 g/dL (ref 3.5–5.0)
Alkaline Phosphatase: 63 U/L (ref 38–126)
Anion gap: 5 (ref 5–15)
BUN: 13 mg/dL (ref 6–20)
CO2: 30 mmol/L (ref 22–32)
Calcium: 9.3 mg/dL (ref 8.9–10.3)
Chloride: 105 mmol/L (ref 98–111)
Creatinine, Ser: 0.65 mg/dL (ref 0.44–1.00)
GFR calc Af Amer: >60 mL/min (ref >60)
GFR calc non Af Amer: >60 mL/min (ref >60)
Glucose, Bld: 117 mg/dL — ABNORMAL HIGH (ref 70–99)
Potassium: 3.6 mmol/L (ref 3.5–5.1)
Sodium: 140 mmol/L (ref 135–145)
Total Bilirubin: 0.4 mg/dL (ref 0.3–1.2)
Total Protein: 6.8 g/dL (ref 6.5–8.1)

## 2019-04-26 LAB — CBC
HCT: 25.7 % — ABNORMAL LOW (ref 36.0–46.0)
Hemoglobin: 6.9 g/dL — ABNORMAL LOW (ref 12.0–15.0)
MCH: 16.5 pg — ABNORMAL LOW (ref 26.0–34.0)
MCHC: 26.8 g/dL — ABNORMAL LOW (ref 30.0–36.0)
MCV: 61.6 fL — ABNORMAL LOW (ref 80.0–100.0)
Platelets: 404 10*3/uL — ABNORMAL HIGH (ref 150–400)
RBC: 4.17 MIL/uL (ref 3.87–5.11)
RDW: 20.4 % — ABNORMAL HIGH (ref 11.5–15.5)
WBC: 6.8 10*3/uL (ref 4.0–10.5)
nRBC: 0 % (ref 0.0–0.2)

## 2019-04-26 LAB — LIPASE, BLOOD: Lipase: 21 U/L (ref 11–51)

## 2019-04-26 NOTE — ED Triage Notes (Signed)
FIRST NURSE NOTE:  Pt here with c/o abd pain, states her PCP did labs and told her that her liver enzymes were elevated and that she might need an [place] of her gall bladder in the morning.   Pt c/o pain after eating each meal.

## 2019-04-26 NOTE — ED Provider Notes (Signed)
[facility] Emergency Department Provider Note   ____________________________________________    I have reviewed the triage vital signs and the nursing notes.   HISTORY  Chief Complaint Abdominal Pain and Abnormal Lab     HPI Emma Fowler is a 34 y.o. female sent in for abnormal labs.  Patient reports that she has had epigastric discomfort with eating over the last several weeks, the pain occasionally radiates into her left lower quadrant as well.  She also notes some fatigue, saw her PCP yesterday, notified of low hemoglobin today.  Reports normal stools.  No vomiting.  Has a history of a tubal ligation.  Does report she frequently chews ice.  History of acid reflux when she had a problem with narcotic pain pill addiction, sober x100 days  Past Medical History:  Diagnosis Date  . Bronchitis   . Depression   . Recurrent UTI     There are no problems to display for this patient.   Past Surgical History:  Procedure Laterality Date  . DILATION AND CURETTAGE OF UTERUS    . TONSILLECTOMY    . TUBAL LIGATION      Prior to Admission medications   Medication Sig Start Date End Date Taking? Authorizing Provider  EPINEPHrine (EPIPEN 2-PAK) 0.3 mg/0.3 mL IJ SOAJ injection Inject 0.3 mLs (0.3 mg total) into the muscle once. 11/04/14   Darci Current, MD  ferrous sulfate 325 (65 FE) MG tablet Take 1 tablet (325 mg total) by mouth daily. 04/27/19 04/26/20  Jene Every, MD  ibuprofen (ADVIL,MOTRIN) 400 MG tablet Take 1 tablet (400 mg total) by mouth every 6 (six) hours as needed for moderate pain. 08/29/15   Gayla Doss, MD  ondansetron (ZOFRAN) 4 MG tablet Take 1 tablet (4 mg total) by mouth every 8 (eight) hours as needed for nausea or vomiting. 04/09/19 04/08/20  Lorre Munroe, NP  polyethylene glycol powder (GLYCOLAX/MIRALAX) powder On the day after you do the magnesium citrate cleanout, start taking MiraLAX once a day. Dissolve 1 tablespoon in 4-8  ounces of juice or water and drink this once a day to prevent constipation. 08/29/15   Gayla Doss, MD  albuterol (PROVENTIL HFA;VENTOLIN HFA) 108 (90 Base) MCG/ACT inhaler Inhale 4-6 puffs by mouth every 4 hours as needed for wheezing, cough, and/or shortness of breath 11/04/15 04/09/19  Loleta Rose, MD     Allergies Patient has no known allergies.  Family History  Problem Relation Age of Onset  . ADD / ADHD Son     Social History Social History   Tobacco Use  . Smoking status: Current Every Day Smoker    Packs/day: 0.50    Types: Cigarettes  . Smokeless tobacco: Never Used  Substance Use Topics  . Alcohol use: No  . Drug use: Not Currently    Review of Systems  Constitutional: No fever/chills Eyes: No visual changes.  ENT: No sore throat. Cardiovascular: Denies chest pain. Respiratory: Denies shortness of breath. Gastrointestinal: As above Genitourinary: Negative for dysuria.  Currently menstruating Musculoskeletal: Negative for back pain. Skin: Negative for rash. Neurological: Negative for headaches    ____________________________________________   PHYSICAL EXAM:  VITAL SIGNS: ED Triage Vitals  Enc Vitals Group     BP 04/26/19 2209 133/81     Pulse Rate 04/26/19 2209 69     Resp 04/26/19 2209 18     Temp 04/26/19 2209 99.6 F (37.6 C)     Temp Source 04/26/19 2209 Oral  SpO2 04/26/19 2209 100 %     Weight 04/26/19 2215 93 kg (205 lb)     Height 04/26/19 2215 1.626 m (5\' 4" )     Head Circumference --      Peak Flow --      Pain Score 04/26/19 2215 7     Pain Loc --      Pain Edu? --      Excl. in [place]? --     Constitutional: Alert and oriented.  Eyes: Conjunctivae are normal.   Nose: No congestion/rhinnorhea. Mouth/Throat: Mucous membranes are moist.    Cardiovascular: Normal rate, regular rhythm. Grossly normal heart sounds.  Good peripheral circulation. Respiratory: Normal respiratory effort.  No retractions. Lungs CTAB. Gastrointestinal:  Mild tenderness in left lower quadrant, no distention, no CVA tenderness, guaiac negative brown stool  Musculoskeletal:  Warm and well perfused Neurologic:  Normal speech and language. No gross focal neurologic deficits are appreciated.  Skin:  Skin is warm, dry and intact. No rash noted. Psychiatric: Mood and affect are normal. Speech and behavior are normal.  ____________________________________________   LABS (all labs ordered are listed, but only abnormal results are displayed)  Labs Reviewed  COMPREHENSIVE METABOLIC PANEL - Abnormal; Notable for the following components:      Result Value   Glucose, Bld 117 (*)    All other components within normal limits  CBC - Abnormal; Notable for the following components:   Hemoglobin 6.9 (*)    HCT 25.7 (*)    MCV 61.6 (*)    MCH 16.5 (*)    MCHC 26.8 (*)    RDW 20.4 (*)    Platelets 404 (*)    All other components within normal limits  URINALYSIS, COMPLETE (UACMP) WITH MICROSCOPIC - Abnormal; Notable for the following components:   Color, Urine YELLOW (*)    APPearance HAZY (*)    Hgb urine dipstick MODERATE (*)    Leukocytes,Ua SMALL (*)    RBC / HPF >50 (*)    All other components within normal limits  FERRITIN - Abnormal; Notable for the following components:   Ferritin 2 (*)    All other components within normal limits  IRON AND TIBC - Abnormal; Notable for the following components:   Iron 12 (*)    TIBC 463 (*)    Saturation Ratios 3 (*)    All other components within normal limits  LIPASE, BLOOD  PREGNANCY, URINE   ____________________________________________  EKG  None ____________________________________________  RADIOLOGY  CT abdomen pelvis overall unremarkable. ____________________________________________   PROCEDURES  Procedure(s) performed: No  Procedures   Critical Care performed: No ____________________________________________   INITIAL IMPRESSION / ASSESSMENT AND PLAN / ED  COURSE  Pertinent labs & imaging results that were available during my care of the patient were reviewed by me and considered in my medical decision making (see chart for details).  Patient presents with abdominal pain as described above, possible gastritis, PUD, diverticulitis.  Lipase is normal.  Lab work is significant for hemoglobin of 6.9 with an MCV of 61.  Review of records demonstrates last blood work done here was several years ago with normal hemoglobin.  Suspicious for iron deficiency anemia given her description of pica.  Will check stool for occult blood.  Obtain CT abdomen pelvis given her left lower quadrant abdominal pain.  CT abdomen pelvis unremarkable.  Stool is guaiac negative.  Iron studies consistent with significant iron deficiency.  Will start on p.o. ferrous sulfate, close follow-up with PCP/hematology  as needed    ____________________________________________   FINAL CLINICAL IMPRESSION(S) / ED DIAGNOSES  Final diagnoses:  Iron deficiency anemia, unspecified iron deficiency anemia type  Generalized abdominal pain        Note:  This document was prepared using Dragon voice recognition software and may include unintentional dictation errors.   Jene Every, MD 04/27/19 [phone]

## 2019-04-26 NOTE — ED Triage Notes (Signed)
Pt presents to ED with left sided abd pain for 2 weeks and fatigue for the past several of weeks. Pain is worse after eating. Pt was seen by pcp at [facility] Monday and had labs drawn. They suggested ultrasound for gallbladder and told pt that her liver enzymes were elevated and blood count was low. Pt said more was abnormal but she cant remember and didn't really understand.

## 2019-04-27 ENCOUNTER — Emergency Department: Payer: Medicaid Other

## 2019-04-27 ENCOUNTER — Encounter: Payer: Self-pay | Admitting: Radiology

## 2019-04-27 LAB — FERRITIN: Ferritin: 2 ng/mL — ABNORMAL LOW (ref 11–307)

## 2019-04-27 LAB — IRON AND TIBC
Iron: 12 ug/dL — ABNORMAL LOW (ref 28–170)
Saturation Ratios: 3 % — ABNORMAL LOW (ref 10.4–31.8)
TIBC: 463 ug/dL — ABNORMAL HIGH (ref 250–450)
UIBC: 451 ug/dL

## 2019-04-27 LAB — PREGNANCY, URINE: Preg Test, Ur: NEGATIVE

## 2019-04-27 MED ORDER — IOHEXOL 300 MG/ML  SOLN
100.0000 mL | Freq: Once | INTRAMUSCULAR | Status: AC | PRN
  Administered 2019-04-27: 01:00:00 100 mL via INTRAVENOUS

## 2019-04-27 MED ORDER — FERROUS SULFATE 325 (65 FE) MG PO TABS: 325.0000 mg | ORAL_TABLET | Freq: Every day | ORAL | 3 refills | Status: DC

## 2019-04-27 NOTE — ED Notes (Signed)
Pt returned from CT °

## 2019-04-27 NOTE — ED Notes (Signed)
Pt up to restroom.

## 2019-04-27 NOTE — ED Notes (Signed)
Dr.Kinner at the bedside. hemoccult negative. Pt tolerated well.

## 2019-04-27 NOTE — ED Notes (Addendum)
Dr. Cyril Loosen at the bedside to discuss CT results and plan of care

## 2019-05-03 ENCOUNTER — Other Ambulatory Visit: Payer: Self-pay | Admitting: *Deleted

## 2019-05-03 ENCOUNTER — Inpatient Hospital Stay: Payer: Medicaid Other

## 2019-05-03 ENCOUNTER — Other Ambulatory Visit: Payer: Self-pay

## 2019-05-03 ENCOUNTER — Inpatient Hospital Stay: Payer: Medicaid Other | Attending: Oncology | Admitting: Oncology

## 2019-05-03 ENCOUNTER — Encounter: Payer: Self-pay | Admitting: Oncology

## 2019-05-03 VITALS — BP 123/60 | HR 72 | Temp 98.1°F | Wt 203.6 lb

## 2019-05-03 DIAGNOSIS — R5383 Other fatigue: Secondary | ICD-10-CM | POA: Insufficient documentation

## 2019-05-03 DIAGNOSIS — R109 Unspecified abdominal pain: Secondary | ICD-10-CM | POA: Diagnosis not present

## 2019-05-03 DIAGNOSIS — D509 Iron deficiency anemia, unspecified: Secondary | ICD-10-CM | POA: Diagnosis present

## 2019-05-03 DIAGNOSIS — F17210 Nicotine dependence, cigarettes, uncomplicated: Secondary | ICD-10-CM | POA: Insufficient documentation

## 2019-05-03 DIAGNOSIS — Z791 Long term (current) use of non-steroidal anti-inflammatories (NSAID): Secondary | ICD-10-CM | POA: Diagnosis not present

## 2019-05-03 DIAGNOSIS — R5381 Other malaise: Secondary | ICD-10-CM | POA: Diagnosis not present

## 2019-05-03 DIAGNOSIS — R748 Abnormal levels of other serum enzymes: Secondary | ICD-10-CM | POA: Insufficient documentation

## 2019-05-03 DIAGNOSIS — Z79899 Other long term (current) drug therapy: Secondary | ICD-10-CM | POA: Diagnosis not present

## 2019-05-03 LAB — CBC
HCT: 27.4 % — ABNORMAL LOW (ref 36.0–46.0)
Hemoglobin: 7.2 g/dL — ABNORMAL LOW (ref 12.0–15.0)
MCH: 16.6 pg — ABNORMAL LOW (ref 26.0–34.0)
MCHC: 26.3 g/dL — ABNORMAL LOW (ref 30.0–36.0)
MCV: 63.1 fL — ABNORMAL LOW (ref 80.0–100.0)
Platelets: 445 10*3/uL — ABNORMAL HIGH (ref 150–400)
RBC: 4.34 MIL/uL (ref 3.87–5.11)
RDW: 20.1 % — ABNORMAL HIGH (ref 11.5–15.5)
WBC: 5.9 10*3/uL (ref 4.0–10.5)
nRBC: 0 % (ref 0.0–0.2)

## 2019-05-03 LAB — RETICULOCYTES
Immature Retic Fract: 15.8 % (ref 2.3–15.9)
RBC.: 4.37 MIL/uL (ref 3.87–5.11)
Retic Count, Absolute: 50.3 10*3/uL (ref 19.0–186.0)
Retic Ct Pct: 1.2 % (ref 0.4–3.1)

## 2019-05-03 LAB — FOLATE: Folate: 7.5 ng/mL (ref >5.9)

## 2019-05-03 LAB — VITAMIN B12: Vitamin B-12: 946 pg/mL — ABNORMAL HIGH (ref 180–914)

## 2019-05-03 NOTE — Progress Notes (Signed)
Patient is here today to establish care for iron deficiency anemia.

## 2019-05-04 DIAGNOSIS — D509 Iron deficiency anemia, unspecified: Secondary | ICD-10-CM | POA: Insufficient documentation

## 2019-05-05 ENCOUNTER — Other Ambulatory Visit: Payer: Self-pay

## 2019-05-05 ENCOUNTER — Inpatient Hospital Stay: Payer: Medicaid Other

## 2019-05-05 VITALS — BP 110/68 | HR 74 | Temp 97.3°F | Resp 20

## 2019-05-05 DIAGNOSIS — D509 Iron deficiency anemia, unspecified: Secondary | ICD-10-CM

## 2019-05-05 LAB — CELIAC DISEASE PANEL
Endomysial Ab, IgA: NEGATIVE
IgA: 223 mg/dL (ref 87–352)
Tissue Transglutaminase Ab, IgA: <2 U/mL (ref 0–3)

## 2019-05-05 MED ORDER — SODIUM CHLORIDE 0.9 % IV SOLN
510.0000 mg | Freq: Once | INTRAVENOUS | Status: AC
  Administered 2019-05-05: 510 mg via INTRAVENOUS
  Filled 2019-05-05: qty 510

## 2019-05-05 MED ORDER — SODIUM CHLORIDE 0.9 % IV SOLN
Freq: Once | INTRAVENOUS | Status: AC
  Filled 2019-05-05: qty 250

## 2019-05-06 ENCOUNTER — Encounter: Payer: Self-pay | Admitting: Oncology

## 2019-05-06 NOTE — Progress Notes (Signed)
Hematology/Oncology Consult note [facility] Telephone:([phone] [phone]  Patient Care Team: Hyman Hopes, MD as PCP - General (Internal Medicine)   Name of the patient: Emma Fowler  [id number]  1985-03-21    Reason for referral-iron deficiency anemia   Referring physician-Dr. Jene Every  Date of visit: 05/06/19   History of presenting illness- Patient is a 34 year old female who presented to the ER with symptoms of abdominal pain she was noted to have significant iron deficiency anemia.  CBC showed white count of 6.8, H&H of 6.9/25.7 with an MCV of 61.6 and a platelet count of 404.  Iron study showed a ferritin of 2.  Iron saturation was low at 3% TIBC elevated at 463.  Urinalysis showed some hematuria.  Urine pregnancy test was negative.  Patient reports that she has her menstrual cycles which last for about 7 days but the first 3 days are particularly heavy.  She also reports ongoing abdominal pain.  Denies any consistent use of NSAIDs.  Denies any family history of colon cancer.  Denies any blood in her stool or urine.  Denies any dark melanotic stools.  Currently reports mild fatigue but denies other complaints  ECOG PS- 1  Pain scale- 0   Review of systems- Review of Systems  Constitutional: Positive for malaise/fatigue. Negative for chills, fever and weight loss.  HENT: Negative for congestion, ear discharge and nosebleeds.   Eyes: Negative for blurred vision.  Respiratory: Negative for cough, hemoptysis, sputum production, shortness of breath and wheezing.   Cardiovascular: Negative for chest pain, palpitations, orthopnea and claudication.  Gastrointestinal: Negative for abdominal pain, blood in stool, constipation, diarrhea, heartburn, melena, nausea and vomiting.  Genitourinary: Negative for dysuria, flank pain, frequency, hematuria and urgency.  Musculoskeletal: Negative for back pain, joint pain and myalgias.  Skin:  Negative for rash.  Neurological: Negative for dizziness, tingling, focal weakness, seizures, weakness and headaches.  Endo/Heme/Allergies: Does not bruise/bleed easily.  Psychiatric/Behavioral: Negative for depression and suicidal ideas. The patient does not have insomnia.     No Known Allergies  Patient Active Problem List   Diagnosis Date Noted  . Iron deficiency anemia 05/04/2019     Past Medical History:  Diagnosis Date  . Anemia   . Bronchitis   . Depression   . Recurrent UTI      Past Surgical History:  Procedure Laterality Date  . DILATION AND CURETTAGE OF UTERUS    . TONSILLECTOMY    . TUBAL LIGATION      Social History   Socioeconomic History  . Marital status: Married    Spouse name: Not on file  . Number of children: Not on file  . Years of education: Not on file  . Highest education level: Not on file  Occupational History  . Not on file  Tobacco Use  . Smoking status: Current Every Day Smoker    Packs/day: 0.50    Types: Cigarettes  . Smokeless tobacco: Never Used  Substance and Sexual Activity  . Alcohol use: No  . Drug use: Not Currently  . Sexual activity: Not on file  Other Topics Concern  . Not on file  Social History Narrative  . Not on file   Social Determinants of Health   Financial Resource Strain:   . Difficulty of Paying Living Expenses: Not on file  Food Insecurity:   . Worried About Programme researcher, broadcasting/film/video in the Last Year: Not on file  . Ran Out of Food  in the Last Year: Not on file  Transportation Needs:   . Lack of Transportation (Medical): Not on file  . Lack of Transportation (Non-Medical): Not on file  Physical Activity:   . Days of Exercise per Week: Not on file  . Minutes of Exercise per Session: Not on file  Stress:   . Feeling of Stress : Not on file  Social Connections:   . Frequency of Communication with Friends and Family: Not on file  . Frequency of Social Gatherings with Friends and Family: Not on file  .  Attends Religious Services: Not on file  . Active Member of Clubs or Organizations: Not on file  . Attends Banker Meetings: Not on file  . Marital Status: Not on file  Intimate Partner Violence:   . Fear of Current or Ex-Partner: Not on file  . Emotionally Abused: Not on file  . Physically Abused: Not on file  . Sexually Abused: Not on file     Family History  Problem Relation Age of Onset  . ADD / ADHD Son      Current Outpatient Medications:  .  ferrous sulfate 325 (65 FE) MG tablet, Take 1 tablet (325 mg total) by mouth daily., Disp: 30 tablet, Rfl: 3 .  ibuprofen (ADVIL,MOTRIN) 400 MG tablet, Take 1 tablet (400 mg total) by mouth every 6 (six) hours as needed for moderate pain., Disp: 20 tablet, Rfl: 0 .  polyethylene glycol powder (GLYCOLAX/MIRALAX) powder, On the day after you do the magnesium citrate cleanout, start taking MiraLAX once a day. Dissolve 1 tablespoon in 4-8 ounces of juice or water and drink this once a day to prevent constipation., Disp: 255 g, Rfl: 0 .  EPINEPHrine (EPIPEN 2-PAK) 0.3 mg/0.3 mL IJ SOAJ injection, Inject 0.3 mLs (0.3 mg total) into the muscle once. (Patient not taking: Reported on 05/03/2019), Disp: 1 Device, Rfl: 0 .  ondansetron (ZOFRAN) 4 MG tablet, Take 1 tablet (4 mg total) by mouth every 8 (eight) hours as needed for nausea or vomiting. (Patient not taking: Reported on 05/03/2019), Disp: 15 tablet, Rfl: 0   Physical exam:  Vitals:   05/03/19 1037  BP: 123/60  Pulse: 72  Temp: 98.1 F (36.7 C)  TempSrc: Tympanic  Weight: 203 lb 9.6 oz (92.4 kg)   Physical Exam Constitutional:      General: She is not in acute distress. HENT:     Head: Normocephalic and atraumatic.  Eyes:     Pupils: Pupils are equal, round, and reactive to light.  Cardiovascular:     Rate and Rhythm: Normal rate and regular rhythm.     Heart sounds: Normal heart sounds.  Pulmonary:     Effort: Pulmonary effort is normal.     Breath sounds: Normal  breath sounds.  Abdominal:     General: Bowel sounds are normal.     Palpations: Abdomen is soft.  Musculoskeletal:     Cervical back: Normal range of motion.  Lymphadenopathy:     Comments: No palpable cervical, supraclavicular, axillary or inguinal adenopathy   Skin:    General: Skin is warm and dry.  Neurological:     Mental Status: She is alert and oriented to person, place, and time.        CMP Latest Ref Rng & Units 04/26/2019  Glucose 70 - 99 mg/dL 782(N)  BUN 6 - 20 mg/dL 13  Creatinine 5.62 - 1.30 mg/dL 8.65  Sodium 784 - 696 mmol/L 140  Potassium 3.5 -  5.1 mmol/L 3.6  Chloride 98 - 111 mmol/L 105  CO2 22 - 32 mmol/L 30  Calcium 8.9 - 10.3 mg/dL 9.3  Total Protein 6.5 - 8.1 g/dL 6.8  Total Bilirubin 0.3 - 1.2 mg/dL 0.4  Alkaline Phos 38 - 126 U/L 63  AST 15 - 41 U/L 39  ALT 0 - 44 U/L 41   CBC Latest Ref Rng & Units 05/03/2019  WBC 4.0 - 10.5 K/uL 5.9  Hemoglobin 12.0 - 15.0 g/dL 7.2(L)  Hematocrit 36.0 - 46.0 % 27.4(L)  Platelets 150 - 400 K/uL 445(H)    No images are attached to the encounter.  CT ABDOMEN PELVIS W CONTRAST  Result Date: 04/27/2019 CLINICAL DATA:  Abdominal pain and elevated liver enzymes EXAM: CT ABDOMEN AND PELVIS WITH CONTRAST TECHNIQUE: Multidetector CT imaging of the abdomen and pelvis was performed using the standard protocol following bolus administration of intravenous contrast. CONTRAST:  113mL OMNIPAQUE IOHEXOL 300 MG/ML  SOLN COMPARISON:  08/29/2015 FINDINGS: LOWER CHEST: Normal. HEPATOBILIARY: Normal hepatic contours. No intra- or extrahepatic biliary dilatation. Normal gallbladder. PANCREAS: Normal pancreas. No ductal dilatation or peripancreatic fluid collection. SPLEEN: Normal. ADRENALS/URINARY TRACT: The adrenal glands are normal. No hydronephrosis, nephroureterolithiasis or solid renal mass. The urinary bladder is normal for degree of distention STOMACH/BOWEL: Small hiatal hernia. Normal duodenal course and caliber. No small bowel  dilatation or inflammation. No focal colonic abnormality. Normal appendix. VASCULAR/LYMPHATIC: Normal course and caliber of the major abdominal vessels. No abdominal or pelvic lymphadenopathy. REPRODUCTIVE: Normal uterus and ovaries. MUSCULOSKELETAL. No bony spinal canal stenosis or focal osseous abnormality. OTHER: None. IMPRESSION: 1. No acute abnormality of the abdomen or pelvis. 2. Small hiatal hernia. Electronically Signed   By: Ulyses Jarred M.D.   On: 04/27/2019 01:15    Assessment and plan- Patient is a 34 y.o. female referred for iron deficiency anemia  Etiology of iron deficiency anemia likely secondary to menorrhagia.  I will refer her to GYN for the same.  However given her history of ongoing abdominal pain I will also refer her to GI to see if endoscopy would be warranted.  Given her significant iron deficiency I would recommend that she should get IV iron for faster recovery of her anemia.  Recommend 2 doses of Feraheme 510 mg IV weekly x2.  Discussed risks and benefits of Feraheme including all but not limited to headache, nausea, leg swelling and possible risk of infusion reaction.  Patient understands and agrees to proceed as planned.  I will see her back in 6 weeks with CBC ferritin and iron studies.  Check B12 folate reticulocyte count and celiac disease panel today.  We will also repeat UA in 6 weeks   Thank you for this kind referral and the opportunity to participate in the care of this patient   Visit Diagnosis 1. Iron deficiency anemia, unspecified iron deficiency anemia type     Dr. Randa Evens, MD, MPH [facility] [phone] 05/06/2019  9:41 AM

## 2019-05-09 ENCOUNTER — Telehealth: Payer: Self-pay | Admitting: Obstetrics & Gynecology

## 2019-05-09 NOTE — Telephone Encounter (Signed)
[facility] referring for Menorrhagia and severe iron deficiency anemia. Called and left voicemail for patient to call back to be schedule

## 2019-05-11 ENCOUNTER — Ambulatory Visit (INDEPENDENT_AMBULATORY_CARE_PROVIDER_SITE_OTHER): Payer: Medicaid Other | Admitting: Gastroenterology

## 2019-05-11 ENCOUNTER — Encounter: Payer: Self-pay | Admitting: Gastroenterology

## 2019-05-11 ENCOUNTER — Other Ambulatory Visit: Payer: Self-pay

## 2019-05-11 VITALS — BP 129/84 | HR 75 | Temp 97.6°F | Wt 200.2 lb

## 2019-05-11 DIAGNOSIS — D509 Iron deficiency anemia, unspecified: Secondary | ICD-10-CM

## 2019-05-11 DIAGNOSIS — D508 Other iron deficiency anemias: Secondary | ICD-10-CM

## 2019-05-11 DIAGNOSIS — R1032 Left lower quadrant pain: Secondary | ICD-10-CM | POA: Diagnosis not present

## 2019-05-11 DIAGNOSIS — K5909 Other constipation: Secondary | ICD-10-CM | POA: Diagnosis not present

## 2019-05-11 MED ORDER — NA SULFATE-K SULFATE-MG SULF 17.5-3.13-1.6 GM/177ML PO SOLN: 354.0000 mL | Freq: Once | ORAL | 0 refills | Status: AC

## 2019-05-11 NOTE — Progress Notes (Signed)
Cephas Darby, MD [address]  [place]  [place], [place] [address]  Main: [phone]  Fax: [phone]    Gastroenterology Consultation  Referring Provider:     Sindy Guadeloupe, MD Primary Care Physician:  Ellamae Sia, MD Primary Gastroenterologist:  Dr. Cephas Darby Reason for Consultation: Iron deficiency anemia        HPI:   Emma Fowler is a 34 y.o. female referred by Dr. Quay Burow, Ala Dach, MD  for consultation & management of iron deficiency anemia.  Patient was diagnosed with severe iron deficiency anemia when she presented to ER on 04/26/2019 with abdominal pain and nausea, found to have hemoglobin 6.9, MCV 61.6, reactive thrombocytosis.  She had ferritin of 2.  She was originally referred to Dr. Janese Banks for parenteral iron therapy due to severe iron deficiency who started her on IV iron.  Patient received first infusion last week, due for second infusion this week.  Patient also reports chronic left lower quadrant pain with infrequent bowel movements.  She denies rectal bleeding, melena.  She denies upper abdominal pain, nausea or vomiting.  She reports that since last iron infusion, her symptoms of pica are improving.  She denies any abdominal surgeries. Most recent hemoglobin was 7.2 about 10 days ago.  Her last normal hemoglobin was 12.9 in 08/2015.  She does not have B12 or folate deficiency.  She reports having heavy menstrual cycles during first 3 days of her cycle since tubal ligation. She denies smoking or alcohol use No evidence of chronic liver disease or chronic kidney disease  NSAIDs: None  Antiplts/Anticoagulants/Anti thrombotics: None  GI Procedures: None She denies family history of GI malignancy, celiac disease, IBD  Past Medical History:  Diagnosis Date  . Anemia   . Bronchitis   . Depression   . Recurrent UTI     Past Surgical History:  Procedure Laterality Date  . DILATION AND CURETTAGE OF UTERUS    . TONSILLECTOMY    . TUBAL  LIGATION      Current Outpatient Medications:  .  Buprenorphine HCl-Naloxone HCl 12-3 MG FILM, Place under the tongue., Disp: , Rfl:  .  Na Sulfate-K Sulfate-Mg Sulf 17.5-3.13-1.6 GM/177ML SOLN, Take 354 mLs by mouth once for 1 dose., Disp: 354 mL, Rfl: 0   Family History  Problem Relation Age of Onset  . ADD / ADHD Son      Social History   Tobacco Use  . Smoking status: Current Every Day Smoker    Packs/day: 0.50    Types: Cigarettes  . Smokeless tobacco: Never Used  Substance Use Topics  . Alcohol use: No  . Drug use: Not Currently    Allergies as of 05/11/2019  . (No Known Allergies)    Review of Systems:    All systems reviewed and negative except where noted in HPI.   Physical Exam:  BP 129/84 (BP Location: Left Arm, Patient Position: Sitting, Cuff Size: Normal)   Pulse 75   Temp 97.6 F (36.4 C) (Oral)   Wt 200 lb 4 oz (90.8 kg)   LMP 04/24/2019   BMI 34.37 kg/m  Patient's last menstrual period was 04/24/2019.  General:   Alert,  Well-developed, well-nourished, pleasant and cooperative in NAD Head:  Normocephalic and atraumatic. Eyes:  Sclera clear, no icterus.   Conjunctiva pale. Ears:  Normal auditory acuity. Nose:  No deformity, discharge, or lesions. Mouth:  No deformity or lesions,oropharynx pink & moist. Neck:  Supple; no masses or  thyromegaly. Lungs:  Respirations even and unlabored.  Clear throughout to auscultation.   No wheezes, crackles, or rhonchi. No acute distress. Heart:  Regular rate and rhythm; no murmurs, clicks, rubs, or gallops. Abdomen:  Normal bowel sounds. Soft, non-tender and non-distended without masses, hepatosplenomegaly or hernias noted.  No guarding or rebound tenderness.   Rectal: Not performed Msk:  Symmetrical without gross deformities. Good, equal movement & strength bilaterally. Pulses:  Normal pulses noted. Extremities:  No clubbing or edema.  No cyanosis. Neurologic:  Alert and oriented x3;  grossly normal  neurologically. Skin:  Intact without significant lesions or rashes. No jaundice. Psych:  Alert and cooperative. Normal mood and affect.  Imaging Studies: Reviewed  Assessment and Plan:   Emma Fowler is a 34 y.o. female with history of menorrhagia is seen in consultation for chronic left lower quadrant pain, constipation and severe iron deficiency anemia  Chronic constipation and left lower quadrant pain Discussed about high-fiber diet Advised to try MiraLAX 1-2 times a day Adequate intake of water  Iron deficiency anemia likely secondary to menorrhagia Given chronic low left lower quadrant pain, recommend colonoscopy Celiac disease panel negative Recommend EGD to evaluate for any structural lesions to explain iron deficiency anemia Recheck CBC today Follow-up with Dr. Smith Robert for parenteral iron therapy  Follow up after the above work-up   Arlyss Repress, MD

## 2019-05-12 ENCOUNTER — Inpatient Hospital Stay: Payer: Medicaid Other

## 2019-05-12 LAB — CBC
Hematocrit: 31 % — ABNORMAL LOW (ref 34.0–46.6)
Hemoglobin: 8.9 g/dL — ABNORMAL LOW (ref 11.1–15.9)
MCH: 18.7 pg — ABNORMAL LOW (ref 26.6–33.0)
MCHC: 28.7 g/dL — ABNORMAL LOW (ref 31.5–35.7)
MCV: 65 fL — ABNORMAL LOW (ref 79–97)
Platelets: 343 10*3/uL (ref 150–450)
RBC: 4.77 x10E6/uL (ref 3.77–5.28)
RDW: 23.1 % — ABNORMAL HIGH (ref 11.7–15.4)
WBC: 7.1 10*3/uL (ref 3.4–10.8)

## 2019-05-18 ENCOUNTER — Other Ambulatory Visit: Payer: Self-pay

## 2019-05-18 ENCOUNTER — Inpatient Hospital Stay: Payer: Medicaid Other | Attending: Oncology

## 2019-05-18 VITALS — BP 129/73 | HR 73 | Temp 98.0°F

## 2019-05-18 DIAGNOSIS — D509 Iron deficiency anemia, unspecified: Secondary | ICD-10-CM

## 2019-05-18 MED ORDER — SODIUM CHLORIDE 0.9 % IV SOLN
Freq: Once | INTRAVENOUS | Status: AC
  Filled 2019-05-18: qty 250

## 2019-05-18 MED ORDER — SODIUM CHLORIDE 0.9 % IV SOLN
510.0000 mg | Freq: Once | INTRAVENOUS | Status: AC
  Administered 2019-05-18: 510 mg via INTRAVENOUS
  Filled 2019-05-18: qty 510

## 2019-05-20 ENCOUNTER — Encounter: Payer: Self-pay | Admitting: Obstetrics & Gynecology

## 2019-05-23 ENCOUNTER — Other Ambulatory Visit
Admission: RE | Admit: 2019-05-23 | Discharge: 2019-05-23 | Disposition: A | Discharge status: Home / Self Care | Payer: Medicaid Other | Source: Ambulatory Visit | Attending: Gastroenterology | Admitting: Gastroenterology

## 2019-05-23 DIAGNOSIS — Z01812 Encounter for preprocedural laboratory examination: Secondary | ICD-10-CM | POA: Diagnosis present

## 2019-05-23 DIAGNOSIS — Z20822 Contact with and (suspected) exposure to covid-19: Secondary | ICD-10-CM | POA: Diagnosis not present

## 2019-05-23 LAB — SARS CORONAVIRUS 2 (TAT 6-24 HRS): SARS Coronavirus 2: NEGATIVE

## 2019-05-25 ENCOUNTER — Ambulatory Visit
Admission: RE | Admit: 2019-05-25 | Discharge: 2019-05-25 | Disposition: A | Discharge status: Home / Self Care | Payer: Medicaid Other | Attending: Gastroenterology | Admitting: Gastroenterology

## 2019-05-25 ENCOUNTER — Encounter: Payer: Self-pay | Admitting: Anesthesiology

## 2019-05-25 ENCOUNTER — Encounter
Admission: RE | Disposition: A | Discharge status: Home / Self Care | Payer: Self-pay | Source: Home / Self Care | Attending: Gastroenterology

## 2019-05-25 ENCOUNTER — Other Ambulatory Visit: Payer: Self-pay

## 2019-05-25 ENCOUNTER — Encounter: Payer: Self-pay | Admitting: Gastroenterology

## 2019-05-25 ENCOUNTER — Telehealth: Payer: Self-pay

## 2019-05-25 SURGERY — COLONOSCOPY WITH PROPOFOL
Anesthesia: General

## 2019-05-25 NOTE — OR Nursing (Signed)
Checked in at Surgical Information desk. Endo was notified of arrival. When RN went out to get patient, she was not present in waiting room. After multiple attempts at locating patient in waiting room, bathrooms and lower lever was searched for patient. Multiple attempts were made at calling patient on provided phone number with no answer. Procedure cancelled due to inability to locate patient.

## 2019-05-25 NOTE — Telephone Encounter (Signed)
Called and left a message for call back  

## 2019-05-25 NOTE — Telephone Encounter (Signed)
-----   Message from Toney Reil, MD sent at 05/25/2019 12:50 PM EDT ----- Regarding: no show She checked in for procedure today but didn't show up when we went to get her, we called her several times Call her tomorrow just to make sure  RV

## 2019-05-26 NOTE — Telephone Encounter (Signed)
Called and left a message for call back. Unable to reach patient  

## 2019-06-14 ENCOUNTER — Inpatient Hospital Stay: Payer: Medicaid Other | Attending: Oncology

## 2019-06-15 ENCOUNTER — Encounter: Payer: Self-pay | Admitting: Obstetrics & Gynecology

## 2019-06-16 ENCOUNTER — Other Ambulatory Visit: Payer: Self-pay

## 2019-06-16 ENCOUNTER — Encounter: Payer: Self-pay | Admitting: Oncology

## 2019-06-16 ENCOUNTER — Telehealth: Payer: Self-pay | Admitting: Oncology

## 2019-06-16 ENCOUNTER — Inpatient Hospital Stay: Payer: Medicaid Other | Admitting: Oncology

## 2019-06-16 NOTE — Progress Notes (Unsigned)
Patient stated that she had been doing well with no complaints. 

## 2019-06-16 NOTE — Telephone Encounter (Signed)
Patient had lab appt scheduled for 06-14-19 and MD appt for 06-16-19. Patient did not attend either appt. Writer phoned patient on this date and left voicemail to reschedule. Letter has also been sent to patient requesting call to reschedule.

## 2019-07-01 ENCOUNTER — Encounter: Payer: Self-pay | Admitting: Obstetrics & Gynecology

## 2019-07-21 ENCOUNTER — Inpatient Hospital Stay: Payer: Medicaid Other | Attending: Oncology

## 2019-07-22 ENCOUNTER — Inpatient Hospital Stay: Payer: Medicaid Other | Admitting: Oncology

## 2019-07-22 ENCOUNTER — Telehealth: Payer: Self-pay

## 2019-07-26 ENCOUNTER — Inpatient Hospital Stay: Payer: Medicaid Other

## 2019-07-28 ENCOUNTER — Encounter: Payer: Self-pay | Admitting: Oncology

## 2019-07-28 ENCOUNTER — Inpatient Hospital Stay: Payer: Medicaid Other | Admitting: Oncology

## 2019-07-28 ENCOUNTER — Telehealth: Payer: Self-pay | Admitting: Oncology

## 2019-07-28 ENCOUNTER — Other Ambulatory Visit: Payer: Self-pay

## 2019-07-28 NOTE — Telephone Encounter (Signed)
Patient did not complete labs or video visit with MD. Ranae Plumber phoned patient on this date and left voicemail to reschedule. Letter also mailed requesting patient phone Cancer Center. Patient to phone Cancer Center to reschedule.

## 2019-09-21 ENCOUNTER — Other Ambulatory Visit: Payer: Self-pay

## 2019-09-21 DIAGNOSIS — R1032 Left lower quadrant pain: Secondary | ICD-10-CM | POA: Diagnosis not present

## 2019-09-21 DIAGNOSIS — F17210 Nicotine dependence, cigarettes, uncomplicated: Secondary | ICD-10-CM | POA: Insufficient documentation

## 2019-09-21 LAB — BASIC METABOLIC PANEL
Anion gap: 11 (ref 5–15)
BUN: 14 mg/dL (ref 6–20)
CO2: 26 mmol/L (ref 22–32)
Calcium: 9.2 mg/dL (ref 8.9–10.3)
Chloride: 102 mmol/L (ref 98–111)
Creatinine, Ser: 0.65 mg/dL (ref 0.44–1.00)
GFR calc Af Amer: >60 mL/min (ref >60)
GFR calc non Af Amer: >60 mL/min (ref >60)
Glucose, Bld: 95 mg/dL (ref 70–99)
Potassium: 4.3 mmol/L (ref 3.5–5.1)
Sodium: 139 mmol/L (ref 135–145)

## 2019-09-21 LAB — URINALYSIS, COMPLETE (UACMP) WITH MICROSCOPIC
Bacteria, UA: NONE SEEN
Bilirubin Urine: NEGATIVE
Glucose, UA: NEGATIVE mg/dL
Hgb urine dipstick: NEGATIVE
Ketones, ur: NEGATIVE mg/dL
Nitrite: NEGATIVE
Protein, ur: NEGATIVE mg/dL
Specific Gravity, Urine: 1.016 (ref 1.005–1.030)
pH: 7 (ref 5.0–8.0)

## 2019-09-21 LAB — CBC
HCT: 37.9 % (ref 36.0–46.0)
Hemoglobin: 12.5 g/dL (ref 12.0–15.0)
MCH: 28.6 pg (ref 26.0–34.0)
MCHC: 33 g/dL (ref 30.0–36.0)
MCV: 86.7 fL (ref 80.0–100.0)
Platelets: 264 10*3/uL (ref 150–400)
RBC: 4.37 MIL/uL (ref 3.87–5.11)
RDW: 13.9 % (ref 11.5–15.5)
WBC: 8.6 10*3/uL (ref 4.0–10.5)
nRBC: 0 % (ref 0.0–0.2)

## 2019-09-21 LAB — TROPONIN I (HIGH SENSITIVITY): Troponin I (High Sensitivity): 3 ng/L (ref <18)

## 2019-09-21 LAB — POCT PREGNANCY, URINE: Preg Test, Ur: NEGATIVE

## 2019-09-21 NOTE — ED Triage Notes (Signed)
Pt arrives to ED via POV from home with c/o back pain and hematemesis x2 days. Pt reports back pain radiates into the RL abdomen. Pt reports the hematemesis was "dark". Pt denies any CP or SHOB. Pt is A&O, in NAD; RR even, regular, and unlabored.

## 2019-09-22 ENCOUNTER — Emergency Department: Payer: Medicaid Other

## 2019-09-22 ENCOUNTER — Emergency Department
Admission: EM | Admit: 2019-09-22 | Discharge: 2019-09-22 | Disposition: A | Discharge status: Home / Self Care | Payer: Medicaid Other | Attending: Emergency Medicine | Admitting: Emergency Medicine

## 2019-09-22 DIAGNOSIS — R109 Unspecified abdominal pain: Secondary | ICD-10-CM

## 2019-09-22 MED ORDER — LIDOCAINE 5 % EX PTCH: 1.0000 | MEDICATED_PATCH | Freq: Two times a day (BID) | CUTANEOUS | 0 refills | Status: AC

## 2019-09-22 MED ORDER — LIDOCAINE 5 % EX PTCH
1.0000 | MEDICATED_PATCH | CUTANEOUS | Status: DC
  Administered 2019-09-22: 1 via TRANSDERMAL
  Filled 2019-09-22: qty 1

## 2019-09-22 MED ORDER — KETOROLAC TROMETHAMINE 30 MG/ML IJ SOLN
30.0000 mg | Freq: Once | INTRAMUSCULAR | Status: AC
  Administered 2019-09-22: 30 mg via INTRAMUSCULAR
  Filled 2019-09-22: qty 1

## 2019-09-22 NOTE — Discharge Instructions (Signed)
Your workup in the Emergency Department today was reassuring.  We did not find any specific abnormalities.  We recommend you drink plenty of fluids, take your regular medications and/or any new ones prescribed today, and follow up with the doctor(s) listed in these documents as recommended.  Return to the Emergency Department if you develop new or worsening symptoms that concern you.  

## 2019-09-22 NOTE — ED Provider Notes (Signed)
[facility] Emergency Department Provider Note  ____________________________________________   First MD Initiated Contact with Patient 09/22/19 0249     (approximate)  I have reviewed the triage vital signs and the nursing notes.   HISTORY  Chief Complaint Back Pain    HPI Emma Fowler is a 34 y.o. female  who presents for evaluation of left flank pain.  No recent trauma.  No dysuria or hematuria.  She reports the pain is sharp and stabbing in her left flank and nothing particular makes it better or worse.  She has had nausea and some vomiting that she describes as white, not the dark or red-colored emesis originally reported in triage.  She denies fever/chills, sore throat, chest pain, shortness of breath.  She has no history of kidney stones.  She does not get frequent urinary tract infections and has not had any burning when she urinates and no increased urinary frequency.         Past Medical History:  Diagnosis Date  . Anemia   . Bronchitis   . Depression   . Recurrent UTI     Patient Active Problem List   Diagnosis Date Noted  . Abdominal pain, left lower quadrant 05/11/2019  . Chronic constipation 05/11/2019  . Iron deficiency anemia 05/04/2019    Past Surgical History:  Procedure Laterality Date  . DILATION AND CURETTAGE OF UTERUS    . TONSILLECTOMY    . TUBAL LIGATION      Prior to Admission medications   Medication Sig Start Date End Date Taking? Authorizing Provider  Buprenorphine HCl-Naloxone HCl 12-3 MG FILM Place under the tongue.    [provider]  lidocaine (LIDODERM) 5 % Place 1 patch onto the skin every 12 (twelve) hours. Remove & Discard patch within 12 hours or as directed by MD.  Wynelle Fanny the patch off for 12 hours before applying a new one. 09/22/19 09/21/20  Loleta Rose, MD  albuterol (PROVENTIL HFA;VENTOLIN HFA) 108 (90 Base) MCG/ACT inhaler Inhale 4-6 puffs by mouth every 4 hours as needed for wheezing,  cough, and/or shortness of breath 11/04/15 04/09/19  Loleta Rose, MD    Allergies Patient has no known allergies.  Family History  Problem Relation Age of Onset  . ADD / ADHD Son     Social History Social History   Tobacco Use  . Smoking status: Current Every Day Smoker    Packs/day: 0.50    Types: Cigarettes  . Smokeless tobacco: Never Used  Vaping Use  . Vaping Use: Every day  Substance Use Topics  . Alcohol use: No  . Drug use: Not Currently    Review of Systems Constitutional: No fever/chills Eyes: No visual changes. ENT: No sore throat. Cardiovascular: Denies chest pain. Respiratory: Denies shortness of breath. Gastrointestinal: No abdominal pain.  No nausea, no vomiting.  No diarrhea.  No constipation. Genitourinary: Negative for dysuria. Musculoskeletal: Left flank pain. Integumentary: Negative for rash. Neurological: Negative for headaches, focal weakness or numbness.   ____________________________________________   PHYSICAL EXAM:  VITAL SIGNS: ED Triage Vitals [09/21/19 2129]  Enc Vitals Group     BP 121/83     Pulse Rate 79     Resp 17     Temp 98.8 F (37.1 C)     Temp Source Oral     SpO2 99 %     Weight 86.2 kg (190 lb)     Height 1.626 m (5\' 4" )     Head Circumference  Peak Flow      Pain Score 8     Pain Loc      Pain Edu?      Excl. in GC?     Constitutional: Alert and oriented.  Eyes: Conjunctivae are normal.  Head: Atraumatic. Nose: No congestion/rhinnorhea. Mouth/Throat: Patient is wearing a mask. Neck: No stridor.  No meningeal signs.   Cardiovascular: Normal rate, regular rhythm. Good peripheral circulation. Grossly normal heart sounds. Respiratory: Normal respiratory effort.  No retractions. Gastrointestinal: Soft and nontender. No distention.  Musculoskeletal: Left CVA tenderness to percussion.  No lower extremity tenderness nor edema. No gross deformities of extremities. Neurologic:  Normal speech and language. No  gross focal neurologic deficits are appreciated.  Skin:  Skin is warm, dry and intact. Psychiatric: Mood and affect are normal. Speech and behavior are normal.  ____________________________________________   LABS (all labs ordered are listed, but only abnormal results are displayed)  Labs Reviewed  URINALYSIS, COMPLETE (UACMP) WITH MICROSCOPIC - Abnormal; Notable for the following components:      Result Value   Color, Urine YELLOW (*)    APPearance CLOUDY (*)    Leukocytes,Ua TRACE (*)    All other components within normal limits  BASIC METABOLIC PANEL  CBC  POC URINE PREG, ED  POCT PREGNANCY, URINE  TROPONIN I (HIGH SENSITIVITY)  TROPONIN I (HIGH SENSITIVITY)  TROPONIN I (HIGH SENSITIVITY)   ____________________________________________  EKG  No indication for emergent EKG ____________________________________________  RADIOLOGY I, Loleta Rose, personally viewed and evaluated these images (plain radiographs) as part of my medical decision making, as well as reviewing the written report by the radiologist.  ED MD interpretation: No acute abnormality identified on CT renal stone protocol  Official radiology report(s): CT Renal Stone Study  Result Date: 09/22/2019 CLINICAL DATA:  Flank pain with kidney stone suspected. EXAM: CT ABDOMEN AND PELVIS WITHOUT CONTRAST TECHNIQUE: Multidetector CT imaging of the abdomen and pelvis was performed following the standard protocol without IV contrast. COMPARISON:  04/27/2019 FINDINGS: Lower chest: Moderate hiatal hernia without over distension, orientation change, or inflammatory findings. Hepatobiliary: No focal liver abnormality.No evidence of biliary obstruction or stone. Pancreas: Unremarkable. Spleen: Unremarkable. Adrenals/Urinary Tract: Negative adrenals. No hydronephrosis or stone. Unremarkable bladder. Stomach/Bowel:  No obstruction. No appendicitis. Vascular/Lymphatic: No acute vascular abnormality. No mass or adenopathy.  Reproductive:No pathologic findings. Other: No ascites or pneumoperitoneum. Musculoskeletal: No acute abnormalities. IMPRESSION: 1. No acute finding. 2. Moderate hiatal hernia. Electronically Signed   By: Marnee Spring M.D.   On: 09/22/2019 04:19    ____________________________________________   PROCEDURES   Procedure(s) performed (including Critical Care):  Procedures   ____________________________________________   INITIAL IMPRESSION / MDM / ASSESSMENT AND PLAN / ED COURSE  As part of my medical decision making, I reviewed the following data within the electronic MEDICAL RECORD NUMBER Nursing notes reviewed and incorporated, Labs reviewed , Old chart reviewed and Notes from prior ED visits   Differential diagnosis includes, but is not limited to, renal/ureteral colic, UTI/pyelonephritis, musculoskeletal strain, much less likely would be renal ischemia.  The patient's urinalysis shows trace leukocytes but she is asymptomatic except for the left flank pain I think it is unlikely that this represents a urinary tract infection.  Pregnancy test is negative, basic metabolic panel and CBC are normal.  The patient has left CVA tenderness to percussion and otherwise a benign abdominal exam.  I explained to her that I think that a kidney stone is most likely and that I recommend we evaluate  with a CT renal stone protocol which she agreed.  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  As an alternative I gave Toradol 30 mg intramuscular and I will reassess after the imaging.       Clinical Course as of Sep 21 832  Thu Sep 22, 2019  0981 Patient has been resting comfortably.  I updated her that there is no acute abnormality on the CT renal protocol.  She is comfortable with the plan to go home.  I ordered a Lidoderm patch for her I gave my usual and customary return precautions.    [CF]    Clinical Course User Index [CF] Loleta Rose, MD     ____________________________________________  FINAL CLINICAL IMPRESSION(S) / ED DIAGNOSES  Final diagnoses:  Left flank pain     MEDICATIONS GIVEN DURING THIS VISIT:  Medications  lidocaine (LIDODERM) 5 % 1 patch (1 patch Transdermal Patch Applied 09/22/19 0604)  ketorolac (TORADOL) 30 MG/ML injection 30 mg (30 mg Intramuscular Given 09/22/19 0400)     ED Discharge Orders         Ordered    lidocaine (LIDODERM) 5 %  Every 12 hours     Discontinue  Reprint     09/22/19 1914          *Please note:  Emma Fowler was evaluated in Emergency Department on 09/22/2019 for the symptoms described in the history of present illness. She was evaluated in the context of the global COVID-19 pandemic, which necessitated consideration that the patient might be at risk for infection with the SARS-CoV-2 virus that causes COVID-19. Institutional protocols and algorithms that pertain to the evaluation of patients at risk for COVID-19 are in a state of rapid change based on information released by regulatory bodies including the CDC and federal and state organizations. These policies and algorithms were followed during the patient's care in the ED.  Some ED evaluations and interventions may be delayed as a result of limited staffing during and after the pandemic.*  Note:  This document was prepared using Dragon voice recognition software and may include unintentional dictation errors.   Loleta Rose, MD 09/22/19 [phone]

## 2019-09-22 NOTE — ED Notes (Signed)
Patient transported to CT 

## 2019-10-20 ENCOUNTER — Ambulatory Visit: Payer: Medicaid Other | Admitting: General Surgery

## 2019-10-27 ENCOUNTER — Other Ambulatory Visit: Payer: Self-pay | Admitting: Internal Medicine

## 2019-10-27 DIAGNOSIS — R1011 Right upper quadrant pain: Secondary | ICD-10-CM

## 2019-11-04 ENCOUNTER — Emergency Department
Admission: EM | Admit: 2019-11-04 | Discharge: 2019-11-04 | Disposition: A | Discharge status: Home / Self Care | Payer: Medicaid Other | Attending: Emergency Medicine | Admitting: Emergency Medicine

## 2019-11-04 ENCOUNTER — Encounter: Payer: Self-pay | Admitting: Emergency Medicine

## 2019-11-04 DIAGNOSIS — R220 Localized swelling, mass and lump, head: Secondary | ICD-10-CM | POA: Diagnosis not present

## 2019-11-04 DIAGNOSIS — R519 Headache, unspecified: Secondary | ICD-10-CM | POA: Diagnosis present

## 2019-11-04 DIAGNOSIS — F17210 Nicotine dependence, cigarettes, uncomplicated: Secondary | ICD-10-CM | POA: Insufficient documentation

## 2019-11-04 MED ORDER — AMOXICILLIN 500 MG PO TABS: 500.0000 mg | ORAL_TABLET | Freq: Three times a day (TID) | ORAL | 0 refills | Status: DC

## 2019-11-04 MED ORDER — AMOXICILLIN 500 MG PO CAPS
500.0000 mg | ORAL_CAPSULE | Freq: Once | ORAL | Status: AC
  Administered 2019-11-04: 500 mg via ORAL
  Filled 2019-11-04: qty 1

## 2019-11-04 MED ORDER — NAPROXEN 500 MG PO TABS
500.0000 mg | ORAL_TABLET | Freq: Once | ORAL | Status: AC
  Administered 2019-11-04: 500 mg via ORAL
  Filled 2019-11-04: qty 1

## 2019-11-04 MED ORDER — NAPROXEN 500 MG PO TABS: 500.0000 mg | ORAL_TABLET | Freq: Two times a day (BID) | ORAL | 0 refills | Status: DC

## 2019-11-04 NOTE — ED Provider Notes (Signed)
[facility] Emergency Department Provider Note ____________________________________________   First MD Initiated Contact with Patient 11/04/19 1850     (approximate)  I have reviewed the triage vital signs and the nursing notes.   HISTORY  Chief Complaint Facial Swelling  HPI Emma Fowler is a 34 y.o. female presents to the emergency department for treatment and evaluation of right-sided facial pain.  Pain started upon awakening this morning and swelling has continued to get worse through the day.  She had similar symptoms last week and was on an antibiotic.  No fever.  No dental pain..         Past Medical History:  Diagnosis Date   Anemia    Bronchitis    Depression    Recurrent UTI     Patient Active Problem List   Diagnosis Date Noted   Abdominal pain, left lower quadrant 05/11/2019   Chronic constipation 05/11/2019   Iron deficiency anemia 05/04/2019    Past Surgical History:  Procedure Laterality Date   DILATION AND CURETTAGE OF UTERUS     TONSILLECTOMY     TUBAL LIGATION      Prior to Admission medications   Medication Sig Start Date End Date Taking? Authorizing Provider  amoxicillin (AMOXIL) 500 MG tablet Take 1 tablet (500 mg total) by mouth 3 (three) times daily. 11/04/19   Chandria Rookstool, Rulon Eisenmenger B, FNP  Buprenorphine HCl-Naloxone HCl 12-3 MG FILM Place under the tongue.    [provider]  lidocaine (LIDODERM) 5 % Place 1 patch onto the skin every 12 (twelve) hours. Remove & Discard patch within 12 hours or as directed by MD.  Wynelle Fanny the patch off for 12 hours before applying a new one. 09/22/19 09/21/20  Loleta Rose, MD  naproxen (NAPROSYN) 500 MG tablet Take 1 tablet (500 mg total) by mouth 2 (two) times daily with a meal. 11/04/19   Rindy Kollman B, FNP  albuterol (PROVENTIL HFA;VENTOLIN HFA) 108 (90 Base) MCG/ACT inhaler Inhale 4-6 puffs by mouth every 4 hours as needed for wheezing, cough, and/or shortness of breath  11/04/15 04/09/19  Loleta Rose, MD    Allergies Patient has no known allergies.  Family History  Problem Relation Age of Onset   ADD / ADHD Son     Social History Social History   Tobacco Use   Smoking status: Current Every Day Smoker    Packs/day: 0.50    Types: Cigarettes   Smokeless tobacco: Never Used  Vaping Use   Vaping Use: Every day  Substance Use Topics   Alcohol use: No   Drug use: Not Currently    Review of Systems  Constitutional: No fever/chills Eyes: No visual changes. ENT: No sore throat. Cardiovascular: Denies chest pain. Respiratory: Denies shortness of breath. Gastrointestinal: No abdominal pain.  No nausea, no vomiting.  No diarrhea.  No constipation. Genitourinary: Negative for dysuria. Musculoskeletal: Negative for back pain. Skin: Positive for facial pain and swelling Neurological: Negative for headaches, focal weakness or numbness. ____________________________________________   PHYSICAL EXAM:  VITAL SIGNS: ED Triage Vitals  Enc Vitals Group     BP 11/04/19 1826 118/63     Pulse Rate 11/04/19 1826 83     Resp 11/04/19 1826 18     Temp 11/04/19 1826 98.3 F (36.8 C)     Temp Source 11/04/19 1826 Oral     SpO2 11/04/19 1826 100 %     Weight 11/04/19 1759 189 lb 9.5 oz (86 kg)     Height  11/04/19 1759 5\' 4"  (1.626 m)     Head Circumference --      Peak Flow --      Pain Score 11/04/19 1759 6     Pain Loc --      Pain Edu? --      Excl. in GC? --     Constitutional: Alert and oriented. Well appearing and in no acute distress. Eyes: Conjunctivae are normal. PERRL. EOMI. Head: Atraumatic. Nose: No congestion/rhinnorhea. Mouth/Throat: Mucous membranes are moist.  Oropharynx non-erythematous.  No obvious dental infection. Neck: No stridor.   Hematological/Lymphatic/Immunilogical: No cervical lymphadenopathy. Cardiovascular: Normal rate, regular rhythm. Grossly normal heart sounds.  Good peripheral circulation. Respiratory:  Normal respiratory effort.  No retractions. Lungs CTAB. Gastrointestinal: Soft and nontender. No distention. No abdominal bruits. No CVA tenderness. Genitourinary:  Musculoskeletal: No lower extremity tenderness nor edema.  No joint effusions. Neurologic:  Normal speech and language. No gross focal neurologic deficits are appreciated. No gait instability. Skin:  Skin is warm, dry and intact. No rash noted.  No erythema over the area of edema on the right side of her face. Psychiatric: Mood and affect are normal. Speech and behavior are normal.  ____________________________________________   LABS (all labs ordered are listed, but only abnormal results are displayed)  Labs Reviewed - No data to display ____________________________________________  EKG  Not indicated ____________________________________________  RADIOLOGY  ED MD interpretation:    Not indicated I, 11/06/19, personally viewed and evaluated these images (plain radiographs) as part of my medical decision making, as well as reviewing the written report by the radiologist.  Official radiology report(s): No results found.  ____________________________________________   PROCEDURES  Procedure(s) performed (including Critical Care):  Procedures  ____________________________________________   INITIAL IMPRESSION / ASSESSMENT AND PLAN     34 year old female presenting to the emergency department for treatment and evaluation of right-sided facial pain.  See HPI for further details.  Exam is most consistent with a dental abscess.  She will be placed on antibiotics and advised to call a dentist for follow-up.  She is on Suboxone and therefore will not be prescribed any additional pain medications with the exception of Naprosyn.    ___________________________________________   FINAL CLINICAL IMPRESSION(S) / ED DIAGNOSES  Final diagnoses:  Facial swelling     ED Discharge Orders         Ordered     amoxicillin (AMOXIL) 500 MG tablet  3 times daily        11/04/19 2058    naproxen (NAPROSYN) 500 MG tablet  2 times daily with meals        11/04/19 2058           Emma Fowler was evaluated in Emergency Department on 11/04/2019 for the symptoms described in the history of present illness. She was evaluated in the context of the global COVID-19 pandemic, which necessitated consideration that the patient might be at risk for infection with the SARS-CoV-2 virus that causes COVID-19. Institutional protocols and algorithms that pertain to the evaluation of patients at risk for COVID-19 are in a state of rapid change based on information released by regulatory bodies including the CDC and federal and state organizations. These policies and algorithms were followed during the patient's care in the ED.   Note:  This document was prepared using Dragon voice recognition software and may include unintentional dictation errors.   11/06/2019, FNP 11/04/19 11/06/19    [id number], MD 11/04/19 [phone]

## 2019-11-04 NOTE — Discharge Instructions (Addendum)
Please call and schedule a dental appointment as soon as possible. You will need to be seen within the next 14 days. Return to the emergency department for symptoms that change or worsen if you're unable to schedule an appointment.  OPTIONS FOR DENTAL FOLLOW UP CARE  [place] Department of Health and Human Services - Local Safety Net Dental Clinics http://www.ncdhhs.gov/dph/oralhealth/services/safetynetclinics.htm   [facility] ([phone])  [facility] ([phone])  [facility] ([phone])  [facility] ([phone])  [facility] ([phone]) This clinic caters to the indigent population and is on a lottery system. Location: [facility], [address], [place] Clinic Hours: Wednesdays from 6pm - 9pm, patients seen by a lottery system. For dates, call or go to [email or web address].[facility].edu/[facility]/patients/[email or web address]-[facility] Services: Cleanings, fillings and simple extractions. Payment Options: DENTAL WORK IS FREE OF CHARGE. Bring proof of income or support. Best way to get seen: Arrive at 5:15 pm - this is a lottery, NOT first come/first serve, so arriving earlier will not increase your chances of being seen.     [facility] [phone] Select option 1 for emergencies   Location: [facility], [address], [place] Clinic Hours: No walk-ins accepted - call the day before to schedule an appointment. Check in times are 9:30 am and 1:30 pm. Services: Simple extractions, temporary fillings, pulpectomy/pulp debridement, uncomplicated abscess drainage. Payment Options: PAYMENT IS DUE AT THE TIME OF SERVICE.  Fee is usually $100-200, additional surgical procedures (e.g. abscess drainage) may be extra. Cash, checks, Visa/MasterCard accepted.  Can file Medicaid if patient is covered for dental - patient should call case worker to check. No  discount for [facility] patients. Best way to get seen: MUST call the day before and get onto the schedule. Can usually be seen the next 1-2 days. No walk-ins accepted.     [facility] [phone]   Location: [facility], [address], [place] Clinic Hours: M, W, Th, F 8am or 1:30pm, Tues 9a or 1:30 - first come/first served. Services: Simple extractions, temporary fillings, uncomplicated abscess drainage.  You do not need to be an [place] resident. Payment Options: PAYMENT IS DUE AT THE TIME OF SERVICE. Dental insurance, otherwise sliding scale - bring proof of income or support. Depending on income and treatment needed, cost is usually $50-200. Best way to get seen: Arrive early as it is first come/first served.     [facility] [phone]   Location: [address] Clinic Hours: Mon-Thu 8a-5p Services: Most basic dental services including extractions and fillings. Payment Options: PAYMENT IS DUE AT THE TIME OF SERVICE. Sliding scale, up to 50% off - bring proof if income or support. Medicaid with dental option accepted. Best way to get seen: Call to schedule an appointment, can usually be seen within 2 weeks OR they will try to see walk-ins - show up at 8a or 2p (you may have to wait).     [facility] [phone] [place] RESIDENTS ONLY   Location: [facility], [address], [place], [place] [address] Clinic Hours: By appointment only. Monday - Thursday 8am-5pm, Friday 8am-12pm Services: Cleanings, fillings, extractions. Payment Options: PAYMENT IS DUE AT THE TIME OF SERVICE. Cash, Visa or MasterCard. Sliding scale - $30 minimum per service. Best way to get seen: Come in to office, complete packet and make an appointment -   need proof of income or support monies for each household member and proof of [place]  residence. Usually takes about a month to get in.     [facility] [phone]   Location: [address] [place] Clinic Hours: Walk-in Urgent Care Dental Services are offered Monday-Friday mornings only. The numbers of emergencies accepted daily is limited to the number of providers available. Maximum 15 - Mondays, Wednesdays & Thursdays Maximum 10 - Tuesdays & Fridays Services: You do not need to be a [place] resident to be seen for a dental emergency. Emergencies are defined as pain, swelling, abnormal bleeding, or dental trauma. Walkins will receive x-rays if needed. NOTE: Dental cleaning is not an emergency. Payment Options: PAYMENT IS DUE AT THE TIME OF SERVICE. Minimum co-pay is $40.00 for uninsured patients. Minimum co-pay is $3.00 for Medicaid with dental coverage. Dental Insurance is accepted and must be presented at time of visit. Medicare does not cover dental. Forms of payment: Cash, credit card, checks. Best way to get seen: If not previously registered with the clinic, walk-in dental registration begins at 7:15 am and is on a first come/first serve basis. If previously registered with the clinic, call to make an appointment.     [facility] [phone] [place] RESIDENTS ONLY   Location: [address], [place], [place] Clinic Hours: Mon-Thu 10a-2p Services: Extractions only! Payment Options: FREE (donations accepted) - bring proof of income or support Best way to get seen: Call and schedule an appointment OR come at 8am on the 1st Monday of every month (except for holidays) when it is first come/first served.     [facility] [phone]   Location: [address], [place] Clinic Hours: Friday mornings Services, Payment Options, Best way to get seen: Call for info  

## 2019-11-04 NOTE — ED Notes (Signed)
Pt states that a few weeks ago she had swelling to the right side of her face and that her doctor prescribed her a abx and the swelling went down. Pt states that today the swelling came back and got worse and taht the right side of her neck and head hurts.

## 2019-11-04 NOTE — ED Triage Notes (Signed)
Presents with right sided facial swelling  Woke up with swelling   Possible dental abscess

## 2019-11-07 ENCOUNTER — Encounter
Admission: RE | Admit: 2019-11-07 | Discharge: 2019-11-07 | Disposition: A | Discharge status: Home / Self Care | Payer: Medicaid Other | Source: Ambulatory Visit | Attending: Internal Medicine | Admitting: Internal Medicine

## 2019-11-07 ENCOUNTER — Other Ambulatory Visit: Payer: Self-pay

## 2019-11-07 DIAGNOSIS — R1011 Right upper quadrant pain: Secondary | ICD-10-CM | POA: Diagnosis present

## 2019-11-07 MED ORDER — TECHNETIUM TC 99M MEBROFENIN IV KIT
5.0000 | PACK | Freq: Once | INTRAVENOUS | Status: AC | PRN
  Administered 2019-11-07: 4.96 via INTRAVENOUS

## 2019-12-22 ENCOUNTER — Encounter: Payer: Self-pay | Admitting: *Deleted

## 2020-01-16 ENCOUNTER — Encounter: Payer: Self-pay | Admitting: Gastroenterology

## 2020-01-16 ENCOUNTER — Ambulatory Visit: Payer: Medicaid Other | Admitting: Gastroenterology

## 2020-01-16 ENCOUNTER — Other Ambulatory Visit: Payer: Self-pay

## 2020-02-04 ENCOUNTER — Other Ambulatory Visit: Payer: Self-pay

## 2020-02-04 ENCOUNTER — Encounter: Payer: Self-pay | Admitting: Emergency Medicine

## 2020-02-04 ENCOUNTER — Emergency Department
Admission: EM | Admit: 2020-02-04 | Discharge: 2020-02-04 | Disposition: A | Discharge status: Home / Self Care | Payer: Medicaid Other | Attending: Emergency Medicine | Admitting: Emergency Medicine

## 2020-02-04 ENCOUNTER — Emergency Department: Payer: Medicaid Other

## 2020-02-04 DIAGNOSIS — Z20822 Contact with and (suspected) exposure to covid-19: Secondary | ICD-10-CM | POA: Diagnosis not present

## 2020-02-04 DIAGNOSIS — R059 Cough, unspecified: Secondary | ICD-10-CM | POA: Diagnosis present

## 2020-02-04 DIAGNOSIS — J069 Acute upper respiratory infection, unspecified: Secondary | ICD-10-CM | POA: Insufficient documentation

## 2020-02-04 DIAGNOSIS — F17210 Nicotine dependence, cigarettes, uncomplicated: Secondary | ICD-10-CM | POA: Diagnosis not present

## 2020-02-04 LAB — RESP PANEL BY RT-PCR (FLU A&B, COVID) ARPGX2
Influenza A by PCR: NEGATIVE
Influenza B by PCR: NEGATIVE
SARS Coronavirus 2 by RT PCR: NEGATIVE

## 2020-02-04 MED ORDER — BENZONATATE 100 MG PO CAPS: 100.0000 mg | ORAL_CAPSULE | Freq: Three times a day (TID) | ORAL | 0 refills | Status: DC | PRN

## 2020-02-04 MED ORDER — AZITHROMYCIN 500 MG PO TABS
500.0000 mg | ORAL_TABLET | Freq: Once | ORAL | Status: AC
  Administered 2020-02-04: 500 mg via ORAL
  Filled 2020-02-04: qty 1

## 2020-02-04 MED ORDER — AZITHROMYCIN 250 MG PO TABS: ORAL_TABLET | ORAL | 0 refills | Status: DC

## 2020-02-04 MED ORDER — PSEUDOEPH-BROMPHEN-DM 30-2-10 MG/5ML PO SYRP: 5.0000 mL | ORAL_SOLUTION | Freq: Four times a day (QID) | ORAL | 0 refills | Status: DC | PRN

## 2020-02-04 NOTE — ED Provider Notes (Signed)
[facility] Emergency Department Provider Note  ____________________________________________  Time seen: Approximately 3:51 PM  I have reviewed the triage vital signs and the nursing notes.   HISTORY  Chief Complaint Cough    HPI Emma Fowler is a 34 y.o. female that presents to the emergency department for evaluation of productive cough with yellow sputum, hoarse voice for 1 week.  Patient states that her daughter lives with her sister and was told that her daughter has COVID-19.  Patient denies any fever, shortness of breath, nausea, vomiting, abdominal pain.   Past Medical History:  Diagnosis Date  . Anemia   . Bronchitis   . Depression   . Recurrent UTI     Patient Active Problem List   Diagnosis Date Noted  . Abdominal pain, left lower quadrant 05/11/2019  . Chronic constipation 05/11/2019  . Iron deficiency anemia 05/04/2019    Past Surgical History:  Procedure Laterality Date  . DILATION AND CURETTAGE OF UTERUS    . TONSILLECTOMY    . TUBAL LIGATION      Prior to Admission medications   Medication Sig Start Date End Date Taking? Authorizing Provider  amoxicillin (AMOXIL) 500 MG tablet Take 1 tablet (500 mg total) by mouth 3 (three) times daily. 11/04/19   Triplett, Rulon Eisenmenger B, FNP  azithromycin (ZITHROMAX Z-PAK) 250 MG tablet Take 2 tablets (500 mg) on  Day 1,  followed by 1 tablet (250 mg) once daily on Days 2 through 5. 02/04/20   Enid Derry, PA-C  benzonatate (TESSALON PERLES) 100 MG capsule Take 1 capsule (100 mg total) by mouth 3 (three) times daily as needed. 02/04/20 02/03/21  Enid Derry, PA-C  brompheniramine-pseudoephedrine-DM 30-2-10 MG/5ML syrup Take 5 mLs by mouth 4 (four) times daily as needed. 02/04/20   Enid Derry, PA-C  Buprenorphine HCl-Naloxone HCl 12-3 MG FILM Place under the tongue.    [provider]  docusate sodium (COLACE) 100 MG capsule Take 100 mg by mouth 2 (two) times daily. 12/05/19    [provider]  lidocaine (LIDODERM) 5 % Place 1 patch onto the skin every 12 (twelve) hours. Remove & Discard patch within 12 hours or as directed by MD.  Wynelle Fanny the patch off for 12 hours before applying a new one. 09/22/19 09/21/20  Loleta Rose, MD  naproxen (NAPROSYN) 500 MG tablet Take 1 tablet (500 mg total) by mouth 2 (two) times daily with a meal. 11/04/19   Triplett, Cari B, FNP  albuterol (PROVENTIL HFA;VENTOLIN HFA) 108 (90 Base) MCG/ACT inhaler Inhale 4-6 puffs by mouth every 4 hours as needed for wheezing, cough, and/or shortness of breath 11/04/15 04/09/19  Loleta Rose, MD    Allergies Patient has no known allergies.  Family History  Problem Relation Age of Onset  . ADD / ADHD Son     Social History Social History   Tobacco Use  . Smoking status: Current Every Day Smoker    Packs/day: 0.50    Types: Cigarettes  . Smokeless tobacco: Never Used  Vaping Use  . Vaping Use: Every day  Substance Use Topics  . Alcohol use: No  . Drug use: Not Currently     Review of Systems  Constitutional: No fever/chills Eyes: No visual changes. No discharge. ENT: Positive for congestion and rhinorrhea. Cardiovascular: No chest pain. Respiratory: Positive for cough. No SOB. Gastrointestinal: No abdominal pain.  No nausea, no vomiting.  No diarrhea.  No constipation. Musculoskeletal: Negative for musculoskeletal pain. Skin: Negative for rash, abrasions, lacerations,  ecchymosis. Neurological: Negative for headaches.   ____________________________________________   PHYSICAL EXAM:  VITAL SIGNS: ED Triage Vitals  Enc Vitals Group     BP 02/04/20 1429 135/82     Pulse Rate 02/04/20 1429 74     Resp 02/04/20 1429 18     Temp 02/04/20 1429 98.8 F (37.1 C)     Temp Source 02/04/20 1429 Oral     SpO2 02/04/20 1429 100 %     Weight 02/04/20 1430 200 lb (90.7 kg)     Height 02/04/20 1430 5\' 4"  (1.626 m)     Head Circumference --      Peak Flow --      Pain Score  02/04/20 1430 0     Pain Loc --      Pain Edu? --      Excl. in GC? --      Constitutional: Alert and oriented. Well appearing and in no acute distress. Eyes: Conjunctivae are normal. PERRL. EOMI. No discharge. Head: Atraumatic. ENT: No frontal and maxillary sinus tenderness.      Ears: Tympanic membranes pearly gray with good landmarks. No discharge.      Nose: Mild congestion/rhinnorhea.      Mouth/Throat: Mucous membranes are moist. Oropharynx non-erythematous. Tonsils not enlarged. No exudates. Uvula midline. Neck: No stridor.   Hematological/Lymphatic/Immunilogical: No cervical lymphadenopathy. Cardiovascular: Normal rate, regular rhythm.  Good peripheral circulation. Respiratory: Normal respiratory effort without tachypnea or retractions. Lungs CTAB. Good air entry to the bases with no decreased or absent breath sounds. Gastrointestinal: Bowel sounds 4 quadrants. Soft and nontender to palpation. No guarding or rigidity. No palpable masses. No distention. Musculoskeletal: Full range of motion to all extremities. No gross deformities appreciated. Neurologic:  Normal speech and language. No gross focal neurologic deficits are appreciated.  Skin:  Skin is warm, dry and intact. No rash noted. Psychiatric: Mood and affect are normal. Speech and behavior are normal. Patient exhibits appropriate insight and judgement.   ____________________________________________   LABS (all labs ordered are listed, but only abnormal results are displayed)  Labs Reviewed  RESP PANEL BY RT-PCR (FLU A&B, COVID) [id number]   ____________________________________________  EKG   ____________________________________________  RADIOLOGY 02/06/20, personally viewed and evaluated these images (plain radiographs) as part of my medical decision making, as well as reviewing the written report by the radiologist.  DG Chest 1 View  Result Date: 02/04/2020 CLINICAL DATA:  Cough and congestion EXAM:  CHEST  1 VIEW COMPARISON:  November 04, 2015 chest radiograph; CT abdomen and pelvis September 22, 2019 FINDINGS: Lungs are clear. Heart size and pulmonary vascularity are normal. No adenopathy. 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 No bone lesions. No pneumothorax. IMPRESSION: Paraesophageal hernia.  Lungs clear.  Heart size normal. Electronically Signed   By: September 24, 2019 III M.D.   On: 02/04/2020 16:26    ____________________________________________    PROCEDURES  Procedure(s) performed:    Procedures    Medications  azithromycin (ZITHROMAX) tablet 500 mg (500 mg Oral Given 02/04/20 1800)     ____________________________________________   INITIAL IMPRESSION / ASSESSMENT AND PLAN / ED COURSE  Pertinent labs & imaging results that were available during my care of the patient were reviewed by me and considered in my medical decision making (see chart for details).  Review of the [place] CSRS was performed in accordance of the [facility] prior to dispensing any controlled drugs.   Patient's diagnosis is consistent with URI. Vital signs and exam are  reassuring. Covid and Influenza tests are negative. Chest xray negative for acute cardiopulmonary processes.  Patient feels comfortable going home. Patient will be discharged home with prescriptions for azithromycin, tessalon perles, and bromfed. Patient is to follow up with PCP as needed or otherwise directed. Patient is given ED precautions to return to the ED for any worsening or new symptoms.  Emma Fowler was evaluated in Emergency Department on 02/04/2020 for the symptoms described in the history of present illness. She was evaluated in the context of the global COVID-19 pandemic, which necessitated consideration that the patient might be at risk for infection with the SARS-CoV-2 virus that causes COVID-19. Institutional protocols and algorithms that pertain to the evaluation of patients at risk for COVID-19 are in a  state of rapid change based on information released by regulatory bodies including the CDC and federal and state organizations. These policies and algorithms were followed during the patient's care in the ED.   ____________________________________________  FINAL CLINICAL IMPRESSION(S) / ED DIAGNOSES  Final diagnoses:  Upper respiratory tract infection, unspecified type      NEW MEDICATIONS STARTED DURING THIS VISIT:  ED Discharge Orders         Ordered    azithromycin (ZITHROMAX Z-PAK) 250 MG tablet        02/04/20 1748    benzonatate (TESSALON PERLES) 100 MG capsule  3 times daily PRN        02/04/20 1748    brompheniramine-pseudoephedrine-DM 30-2-10 MG/5ML syrup  4 times daily PRN        02/04/20 1748              This chart was dictated using voice recognition software/Dragon. Despite best efforts to proofread, errors can occur which can change the meaning. Any change was purely unintentional.    Enid Derry, PA-C 02/04/20 1834    Phineas Semen, MD 02/04/20 [phone]

## 2020-02-04 NOTE — ED Triage Notes (Signed)
Pt arrived via POV c/o cough and congestion x 1 week, denies fevers.  Pt states she has had positive covid contacts.

## 2020-03-13 ENCOUNTER — Emergency Department: Payer: Medicaid Other

## 2020-03-13 ENCOUNTER — Emergency Department
Admission: EM | Admit: 2020-03-13 | Discharge: 2020-03-13 | Disposition: A | Discharge status: Home / Self Care | Payer: Medicaid Other | Attending: Emergency Medicine | Admitting: Emergency Medicine

## 2020-03-13 ENCOUNTER — Other Ambulatory Visit: Payer: Self-pay

## 2020-03-13 DIAGNOSIS — J1282 Pneumonia due to coronavirus disease 2019: Secondary | ICD-10-CM | POA: Insufficient documentation

## 2020-03-13 DIAGNOSIS — U071 COVID-19: Secondary | ICD-10-CM | POA: Insufficient documentation

## 2020-03-13 DIAGNOSIS — R079 Chest pain, unspecified: Secondary | ICD-10-CM | POA: Diagnosis present

## 2020-03-13 LAB — BASIC METABOLIC PANEL
Anion gap: 10 (ref 5–15)
BUN: 8 mg/dL (ref 6–20)
CO2: 29 mmol/L (ref 22–32)
Calcium: 9.1 mg/dL (ref 8.9–10.3)
Chloride: 99 mmol/L (ref 98–111)
Creatinine, Ser: 0.6 mg/dL (ref 0.44–1.00)
GFR, Estimated: >60 mL/min (ref >60)
Glucose, Bld: 101 mg/dL — ABNORMAL HIGH (ref 70–99)
Potassium: 4 mmol/L (ref 3.5–5.1)
Sodium: 138 mmol/L (ref 135–145)

## 2020-03-13 LAB — CBC
HCT: 38.6 % (ref 36.0–46.0)
Hemoglobin: 12.2 g/dL (ref 12.0–15.0)
MCH: 25.3 pg — ABNORMAL LOW (ref 26.0–34.0)
MCHC: 31.6 g/dL (ref 30.0–36.0)
MCV: 80.1 fL (ref 80.0–100.0)
Platelets: 152 10*3/uL (ref 150–400)
RBC: 4.82 MIL/uL (ref 3.87–5.11)
RDW: 14.6 % (ref 11.5–15.5)
WBC: 2.9 10*3/uL — ABNORMAL LOW (ref 4.0–10.5)
nRBC: 0 % (ref 0.0–0.2)

## 2020-03-13 LAB — TROPONIN I (HIGH SENSITIVITY): Troponin I (High Sensitivity): 5 ng/L (ref <18)

## 2020-03-13 MED ORDER — DEXAMETHASONE SODIUM PHOSPHATE 10 MG/ML IJ SOLN
8.0000 mg | Freq: Once | INTRAMUSCULAR | Status: AC
  Administered 2020-03-13: 8 mg via INTRAMUSCULAR
  Filled 2020-03-13: qty 1

## 2020-03-13 NOTE — ED Provider Notes (Signed)
[facility] Emergency Department Provider Note   ____________________________________________    I have reviewed the triage vital signs and the nursing notes.   HISTORY  Chief Complaint Chest Pain and COVID+     HPI Emma Fowler is a 35 y.o. female with known covid 72 who presents with complaints of chest discomfort, mild shortness of breath, cough.  Positive fevers chills body aches.  Has been taking over-the-counter medications.  No nausea or vomiting.  Sent by a physician for evaluation given chest discomfort.  She describes it as a mild discomfort in the chest especially with coughing.  Has had symptoms for 8 to 9 days  Past Medical History:  Diagnosis Date  . Anemia   . Bronchitis   . Depression   . Recurrent UTI     Patient Active Problem List   Diagnosis Date Noted  . Abdominal pain, left lower quadrant 05/11/2019  . Chronic constipation 05/11/2019  . Iron deficiency anemia 05/04/2019    Past Surgical History:  Procedure Laterality Date  . DILATION AND CURETTAGE OF UTERUS    . TONSILLECTOMY    . TUBAL LIGATION      Prior to Admission medications   Medication Sig Start Date End Date Taking? Authorizing Provider  amoxicillin (AMOXIL) 500 MG tablet Take 1 tablet (500 mg total) by mouth 3 (three) times daily. 11/04/19   Triplett, Rulon Eisenmenger B, FNP  azithromycin (ZITHROMAX Z-PAK) 250 MG tablet Take 2 tablets (500 mg) on  Day 1,  followed by 1 tablet (250 mg) once daily on Days 2 through 5. 02/04/20   Enid Derry, PA-C  benzonatate (TESSALON PERLES) 100 MG capsule Take 1 capsule (100 mg total) by mouth 3 (three) times daily as needed. 02/04/20 02/03/21  Enid Derry, PA-C  brompheniramine-pseudoephedrine-DM 30-2-10 MG/5ML syrup Take 5 mLs by mouth 4 (four) times daily as needed. 02/04/20   Enid Derry, PA-C  Buprenorphine HCl-Naloxone HCl 12-3 MG FILM Place under the tongue.    [provider]  docusate sodium (COLACE) 100  MG capsule Take 100 mg by mouth 2 (two) times daily. 12/05/19   [provider]  lidocaine (LIDODERM) 5 % Place 1 patch onto the skin every 12 (twelve) hours. Remove & Discard patch within 12 hours or as directed by MD.  Wynelle Fanny the patch off for 12 hours before applying a new one. 09/22/19 09/21/20  Loleta Rose, MD  naproxen (NAPROSYN) 500 MG tablet Take 1 tablet (500 mg total) by mouth 2 (two) times daily with a meal. 11/04/19   Triplett, Cari B, FNP  albuterol (PROVENTIL HFA;VENTOLIN HFA) 108 (90 Base) MCG/ACT inhaler Inhale 4-6 puffs by mouth every 4 hours as needed for wheezing, cough, and/or shortness of breath 11/04/15 04/09/19  Loleta Rose, MD     Allergies Patient has no known allergies.  Family History  Problem Relation Age of Onset  . ADD / ADHD Son     Social History Social History   Tobacco Use  . Smoking status: Current Every Day Smoker    Packs/day: 0.50    Types: Cigarettes  . Smokeless tobacco: Never Used  Vaping Use  . Vaping Use: Every day  Substance Use Topics  . Alcohol use: No  . Drug use: Not Currently    Review of Systems  Constitutional: As above Eyes: No visual changes.  ENT: No sore throat. Cardiovascular: As above Respiratory: Denies shortness of breath. Gastrointestinal: No abdominal pain.  No nausea, no vomiting.   Genitourinary:  Negative for dysuria. Musculoskeletal: Myalgias Skin: Negative for rash. Neurological: Negative forweakness   ____________________________________________   PHYSICAL EXAM:  VITAL SIGNS: ED Triage Vitals  Enc Vitals Group     BP 03/13/20 0937 129/89     Pulse Rate 03/13/20 0937 80     Resp 03/13/20 0937 18     Temp 03/13/20 0937 99 F (37.2 C)     Temp Source 03/13/20 0937 Oral     SpO2 03/13/20 0937 100 %     Weight 03/13/20 0938 86.2 kg (190 lb)     Height 03/13/20 0938 1.626 m (5\' 4" )     Head Circumference --      Peak Flow --      Pain Score 03/13/20 0932 6     Pain Loc --      Pain Edu?  --      Excl. in GC? --     Constitutional: Alert and oriented. No acute distress.  Nose: No congestion/rhinnorhea. Mouth/Throat: Mucous membranes are moist.    Cardiovascular: Normal rate, regular rhythm.   Good peripheral circulation. Respiratory: Normal respiratory effort.  No retractions Gastrointestinal: Soft and nontender. No distention.  No CVA tenderness.  Musculoskeletal:   Warm and well perfused Neurologic:  Normal speech and language. No gross focal neurologic deficits are appreciated.  Skin:  Skin is warm, dry and intact. No rash noted. Psychiatric: Mood and affect are normal. Speech and behavior are normal.  ____________________________________________   LABS (all labs ordered are listed, but only abnormal results are displayed)  Labs Reviewed  BASIC METABOLIC PANEL - Abnormal; Notable for the following components:      Result Value   Glucose, Bld 101 (*)    All other components within normal limits  CBC - Abnormal; Notable for the following components:   WBC 2.9 (*)    MCH 25.3 (*)    All other components within normal limits  TROPONIN I (HIGH SENSITIVITY)   ____________________________________________  EKG  ED ECG REPORT I, 05/11/20, the attending physician, personally viewed and interpreted this ECG.  Date: 03/13/2020  Rhythm: normal sinus rhythm QRS Axis: normal Intervals: normal ST/T Wave abnormalities: normal Narrative Interpretation: no evidence of acute ischemia  ____________________________________________  RADIOLOGY  Chest x-ray viewed by me, left-sided pneumonia ____________________________________________   PROCEDURES  Procedure(s) performed: No  Procedures   Critical Care performed:No ____________________________________________   INITIAL IMPRESSION / ASSESSMENT AND PLAN / ED COURSE  Pertinent labs & imaging results that were available during my care of the patient were reviewed by me and considered in my medical  decision making (see chart for details).  Patient with known COVID-19 presents with shortness of breath, mild chest discomfort especially with coughing.  Vital signs are reassuring, oxygenation is reassuring.  EKG-troponin are unremarkable.  Suspect chest pain is related to COVID-19, no pleurisy or severe tachycardias to suggest PE.  Chest x-ray reviewed by me, consistent with Covid pneumonia.  Not a candidate for IV infusions at this time, recommend supportive care, return cautions discussed    ____________________________________________   FINAL CLINICAL IMPRESSION(S) / ED DIAGNOSES  Final diagnoses:  Pneumonia due to COVID-19 virus        Note:  This document was prepared using Dragon voice recognition software and may include unintentional dictation errors.   05/11/2020, MD 03/13/20 [phone]

## 2020-03-13 NOTE — ED Triage Notes (Signed)
Pt comes via POV from home with c/o CP , COVID+ and SOB. Pt states she was tested on Thursday and found out yesterday that she was positive. Pt states she hurts to take a deep breath and central CP.

## 2020-10-01 NOTE — H&P (Signed)
[name] is a 35 y.o. female here for [facility] and bilateral salpingectomy .   Indication : menorrhagia  She has been bleeding monthly  With flow for 7 days . ++ clots , uses 28 pads per cycle . Since 2021. ++ dysmenorrhea . All worse after BTL . 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  Past Surgical History:  has a past surgical history that includes Tubal ligation (2012). Family History: family history is not on file. Social History:  reports that she has been smoking. She has never used smokeless tobacco. She reports previous alcohol use. She reports that she does not use drugs. OB/GYN History:          OB History     Gravida  5   Para  4   Term      Preterm      AB  1   Living  4      SAB      IAB      Ectopic      Molar      Multiple      Live Births  4             Allergies: has No Known Allergies. Medications:   Current Outpatient Medications:    buprenorphine HCl/naloxone HCl (SUBOXONE SL), Place under the tongue 8mg  SL - 1 1/2 tab twice a day, Disp: , Rfl:    phentermine (ADIPEX-P) 37.5 MG capsule, TAKE 1 CAPSULE BY MOUTH ONCE A DAY FOR WEIGHT, Disp: , Rfl:    Review of Systems: General:                      No fatigue or weight loss Eyes:                           No vision changes Ears:                            No hearing difficulty Respiratory:                No cough or shortness of breath Pulmonary:                  No asthma or shortness of breath Cardiovascular:           No chest pain, palpitations, dyspnea on exertion Gastrointestinal:          No abdominal bloating, chronic diarrhea, constipations, masses, pain or  hematochezia Genitourinary:             No hematuria, dysuria, abnormal vaginal discharge, pelvic pain, Menometrorrhagia Lymphatic:                   No swollen lymph nodes Musculoskeletal:         No muscle weakness Neurologic:                  No extremity weakness, syncope, seizure disorder Psychiatric:  No history of depression, delusions or suicidal/homicidal ideation      Exam:       Vitals:    07/265/22 0933  BP: 118/78  Pulse: 98      Body mass index is 35.87 kg/m.   WDWN white/  female in NAD   Lungs: CTA  CV : RRR without murmur   Neck:  no thyromegaly Abdomen: soft , no mass, normal active bowel sounds,  non-tender, no rebound tenderness Pelvic: tanner stage 5 ,  External genitalia: vulva /labia no lesions Urethra: no prolapse Vagina: normal physiologic d/c, adequate room for TVH  Cervix: no lesions, no cervical motion tenderness   Uterus: normal size shape and contour, non-tender Adnexa: no mass,  non-tender   Rectovaginal:  Saline infusion sonohysterography: betadine prep to the cervix followed by placement of the HSG catheter into the endometrial canal . Sterile H2O is injected while performing a transvaginal u/s . Findings:no endometrial pathology noted  Saline  [place]     Ut wnl          Endometrium=13.64 mm   bil ovs wnl     Impression:    The primary encounter diagnosis was Menorrhagia with irregular cycle. Diagnoses of Iron deficiency anemia due to chronic blood loss and Secondary dysmenorrhea were also pertinent to this visit.       Plan:   I have spoken with the patient regarding treatment options including expectant management, hormonal options, or surgical intervention. After a full discussion the pt elects to proceed with TVH , bilateral salpingectomy I have thoroughly explained the procedure  Benefits and risks to surgery: The proposed benefit of the surgery has been discussed with the patient. The possible risks include, but  are not limited to: organ injury to the bowel , bladder, ureters, and major blood vessels and nerves. There is a possibility of additional surgeries resulting from these injuries. There is also the risk of blood transfusion and the need to receive blood products during or after the procedure which may rarely lead to HIV or Hepatitis C infection. There is a risk of developing a deep venous thrombosis or a pulmonary embolism . There is the possibility of wound infection and also anesthetic complications, even the rare possibility of death. The patient understands these risks and wishes to proceed. All questions have been answered and the consent has been signed.        Vilma Prader, MD            Electronically signed by Jennell Corner

## 2020-10-12 ENCOUNTER — Encounter
Admission: RE | Admit: 2020-10-12 | Discharge: 2020-10-12 | Disposition: A | Discharge status: Home / Self Care | Payer: Medicaid Other | Source: Ambulatory Visit | Attending: Obstetrics and Gynecology | Admitting: Obstetrics and Gynecology

## 2020-10-12 ENCOUNTER — Other Ambulatory Visit: Payer: Self-pay

## 2020-10-12 HISTORY — DX: Other psychoactive substance abuse, uncomplicated: F19.10

## 2020-10-12 HISTORY — DX: Other complications of anesthesia, initial encounter: T88.59XA

## 2020-10-12 HISTORY — DX: Personal history of other diseases of the digestive system: Z87.19

## 2020-10-12 NOTE — Patient Instructions (Addendum)
Your procedure is scheduled on: 10/22/2020 Monday Report to the Registration Desk on the 1st floor of the [facility]-Then proceed to the 2nd floor Surgery Desk in the [facility] To find out your arrival time, please call [phone] between 1PM - 3PM on: 10/19/2020 Friday  REMEMBER: Instructions that are not followed completely may result in serious medical risk, up to and including death; or upon the discretion of your surgeon and anesthesiologist your surgery may need to be rescheduled.  Do not eat food after midnight the night before surgery.  No gum chewing, lozengers or hard candies.  You may however, drink CLEAR liquids up to 2 hours before you are scheduled to arrive for your surgery. Do not drink anything within 2 hours of your scheduled arrival time.  Clear liquids include: - water  - apple juice without pulp - gatorade (not RED, PURPLE, OR BLUE) - black coffee or tea (Do NOT add milk or creamers to the coffee or tea) Do NOT drink anything that is not on this list.  In addition, your doctor has ordered for you to drink the provided  Ensure Pre-Surgery Clear Carbohydrate Drink  Drinking this carbohydrate drink up to two hours before surgery helps to reduce insulin resistance and improve patient outcomes. Please complete drinking 2 hours prior to scheduled arrival time.  Do NOT take Suboxone on day of surgery  Stop your Phentermine 7 days prior to surgery  One week prior to surgery: Stop Anti-inflammatories (NSAIDS) such as Advil, Aleve, Ibuprofen, Motrin, Naproxen, Naprosyn and Aspirin based products such as Excedrin, Goodys Powder, BC Powder.You may however, continue to take Tylenol if needed for pain up until the day of surgery.  Stop ANY OVER THE COUNTER supplements/vitamins 7 days prior to Surgery  No Alcohol for 24 hours before or after surgery.  No Smoking including e-cigarettes for 24 hours prior to surgery.  No chewable tobacco products for at least 6 hours  prior to surgery.  No nicotine patches on the day of surgery.  Do not use any "recreational" drugs for at least a week prior to your surgery.  Please be advised that the combination of cocaine and anesthesia may have negative outcomes, up to and including death. If you test positive for cocaine, your surgery will be cancelled.  On the morning of surgery brush your teeth with toothpaste and water, you may rinse your mouth with mouthwash if you wish. Do not swallow any toothpaste or mouthwash.  Do not wear jewelry, make-up, hairpins, clips or nail polish.  Do not wear lotions, powders, or perfumes.   Do not shave body from the neck down 48 hours prior to surgery just in case you cut yourself which could leave a site for infection.  Also, freshly shaved skin may become irritated if using the CHG soap.  Contact lenses, hearing aids and dentures may not be worn into surgery.  Do not bring valuables to the hospital. [facility] is not responsible for any missing/lost belongings or valuables.   Use CHG Soap as directed on instruction sheet.  Notify your doctor if there is any change in your medical condition (cold, fever, infection).  Wear comfortable clothing (specific to your surgery type) to the hospital.  After surgery, you can help prevent lung complications by doing breathing exercises.  Take deep breaths and cough every 1-2 hours. Your doctor may order a device called an Incentive Spirometer to help you take deep breaths.  If you are being admitted to the hospital  overnight, leave your suitcase in the car. After surgery it may be brought to your room.  If you are being discharged the day of surgery, you will not be allowed to drive home. You will need a responsible adult (18 years or older) to drive you home and stay with you that night.   If you are taking public transportation, you will need to have a responsible adult (18 years or older) with you. Please confirm with your  physician that it is acceptable to use public transportation.   Please call the Pre-admissions Testing Dept. at [phone] if you have any questions about these instructions.  Surgery Visitation Policy:  Patients undergoing a surgery or procedure may have one family member or support person with them as long as that person is not COVID-19 positive or experiencing its symptoms.  That person may remain in the waiting area during the procedure.  Inpatient Visitation:    Visiting hours are 7 a.m. to 8 p.m. Inpatients will be allowed two visitors daily. The visitors may change each day during the patient's stay. No visitors under the age of 61. Any visitor under the age of 60 must be accompanied by an adult. The visitor must pass COVID-19 screenings, use hand sanitizer when entering and exiting the patient's room and wear a mask at all times, including in the patient's room. Patients must also wear a mask when staff or their visitor are in the room. Masking is required regardless of vaccination status.

## 2020-10-16 ENCOUNTER — Other Ambulatory Visit: Payer: Self-pay

## 2020-10-16 ENCOUNTER — Encounter
Admission: RE | Admit: 2020-10-16 | Discharge: 2020-10-16 | Disposition: A | Discharge status: Home / Self Care | Payer: Medicaid Other | Source: Ambulatory Visit | Attending: Obstetrics and Gynecology | Admitting: Obstetrics and Gynecology

## 2020-10-16 DIAGNOSIS — Z01812 Encounter for preprocedural laboratory examination: Secondary | ICD-10-CM | POA: Diagnosis not present

## 2020-10-16 LAB — CBC
HCT: 33.8 % — ABNORMAL LOW (ref 36.0–46.0)
Hemoglobin: 10.7 g/dL — ABNORMAL LOW (ref 12.0–15.0)
MCH: 24 pg — ABNORMAL LOW (ref 26.0–34.0)
MCHC: 31.7 g/dL (ref 30.0–36.0)
MCV: 76 fL — ABNORMAL LOW (ref 80.0–100.0)
Platelets: 324 10*3/uL (ref 150–400)
RBC: 4.45 MIL/uL (ref 3.87–5.11)
RDW: 15.8 % — ABNORMAL HIGH (ref 11.5–15.5)
WBC: 7.2 10*3/uL (ref 4.0–10.5)
nRBC: 0 % (ref 0.0–0.2)

## 2020-10-16 LAB — BASIC METABOLIC PANEL
Anion gap: 5 (ref 5–15)
BUN: 12 mg/dL (ref 6–20)
CO2: 27 mmol/L (ref 22–32)
Calcium: 9.1 mg/dL (ref 8.9–10.3)
Chloride: 106 mmol/L (ref 98–111)
Creatinine, Ser: 0.66 mg/dL (ref 0.44–1.00)
GFR, Estimated: >60 mL/min (ref >60)
Glucose, Bld: 82 mg/dL (ref 70–99)
Potassium: 3.6 mmol/L (ref 3.5–5.1)
Sodium: 138 mmol/L (ref 135–145)

## 2020-10-17 LAB — TYPE AND SCREEN
ABO/RH(D): A POS
Antibody Screen: NEGATIVE

## 2020-10-22 ENCOUNTER — Encounter: Payer: Self-pay | Admitting: Obstetrics and Gynecology

## 2020-10-22 ENCOUNTER — Encounter
Admission: RE | Disposition: A | Discharge status: Home / Self Care | Payer: Self-pay | Source: Home / Self Care | Attending: Obstetrics and Gynecology

## 2020-10-22 ENCOUNTER — Ambulatory Visit: Payer: Medicaid Other | Admitting: Certified Registered Nurse Anesthetist

## 2020-10-22 ENCOUNTER — Ambulatory Visit
Admission: RE | Admit: 2020-10-22 | Discharge: 2020-10-22 | Disposition: A | Discharge status: Home / Self Care | Payer: Medicaid Other | Attending: Obstetrics and Gynecology | Admitting: Obstetrics and Gynecology

## 2020-10-22 ENCOUNTER — Other Ambulatory Visit: Payer: Self-pay

## 2020-10-22 DIAGNOSIS — D500 Iron deficiency anemia secondary to blood loss (chronic): Secondary | ICD-10-CM | POA: Insufficient documentation

## 2020-10-22 DIAGNOSIS — N920 Excessive and frequent menstruation with regular cycle: Secondary | ICD-10-CM | POA: Diagnosis present

## 2020-10-22 DIAGNOSIS — N859 Noninflammatory disorder of uterus, unspecified: Secondary | ICD-10-CM | POA: Diagnosis not present

## 2020-10-22 DIAGNOSIS — N946 Dysmenorrhea, unspecified: Secondary | ICD-10-CM | POA: Diagnosis not present

## 2020-10-22 HISTORY — PX: VAGINAL HYSTERECTOMY: SHX2639

## 2020-10-22 HISTORY — PX: BILATERAL SALPINGECTOMY: SHX5743

## 2020-10-22 LAB — POCT PREGNANCY, URINE: Preg Test, Ur: NEGATIVE

## 2020-10-22 LAB — ABO/RH: ABO/RH(D): A POS

## 2020-10-22 SURGERY — HYSTERECTOMY, VAGINAL
Anesthesia: General

## 2020-10-22 MED ORDER — ROCURONIUM BROMIDE 100 MG/10ML IV SOLN
INTRAVENOUS | Status: DC | PRN
  Administered 2020-10-22: 50 mg via INTRAVENOUS
  Administered 2020-10-22: 20 mg via INTRAVENOUS

## 2020-10-22 MED ORDER — CEFAZOLIN SODIUM-DEXTROSE 2-4 GM/100ML-% IV SOLN
INTRAVENOUS | Status: AC
  Filled 2020-10-22: qty 100

## 2020-10-22 MED ORDER — ORAL CARE MOUTH RINSE: 15.0000 mL | Freq: Once | OROMUCOSAL | Status: AC

## 2020-10-22 MED ORDER — GLYCOPYRROLATE 0.2 MG/ML IJ SOLN
INTRAMUSCULAR | Status: AC
  Filled 2020-10-22: qty 1

## 2020-10-22 MED ORDER — GABAPENTIN 300 MG PO CAPS: 300.0000 mg | ORAL_CAPSULE | ORAL | Status: AC

## 2020-10-22 MED ORDER — HYDROMORPHONE HCL 1 MG/ML IJ SOLN
INTRAMUSCULAR | Status: AC
  Administered 2020-10-22: 0.5 mg via INTRAVENOUS
  Filled 2020-10-22: qty 1

## 2020-10-22 MED ORDER — PROMETHAZINE HCL 25 MG/ML IJ SOLN: 6.2500 mg | INTRAMUSCULAR | Status: DC | PRN

## 2020-10-22 MED ORDER — 0.9 % SODIUM CHLORIDE (POUR BTL) OPTIME
TOPICAL | Status: DC | PRN
  Administered 2020-10-22: 10 mL

## 2020-10-22 MED ORDER — FENTANYL CITRATE (PF) 100 MCG/2ML IJ SOLN
INTRAMUSCULAR | Status: AC
  Filled 2020-10-22: qty 2

## 2020-10-22 MED ORDER — CHLORHEXIDINE GLUCONATE 0.12 % MT SOLN
OROMUCOSAL | Status: AC
  Administered 2020-10-22: 15 mL via OROMUCOSAL
  Filled 2020-10-22: qty 15

## 2020-10-22 MED ORDER — HYDROMORPHONE HCL 1 MG/ML IJ SOLN
0.5000 mg | INTRAMUSCULAR | Status: AC | PRN
  Administered 2020-10-22 (×2): 0.5 mg via INTRAVENOUS

## 2020-10-22 MED ORDER — LIDOCAINE-EPINEPHRINE 1 %-1:100000 IJ SOLN
INTRAMUSCULAR | Status: DC | PRN
  Administered 2020-10-22: 10 mL

## 2020-10-22 MED ORDER — MIDAZOLAM HCL 2 MG/2ML IJ SOLN
INTRAMUSCULAR | Status: AC
  Filled 2020-10-22: qty 2

## 2020-10-22 MED ORDER — CHLORHEXIDINE GLUCONATE 0.12 % MT SOLN: 15.0000 mL | Freq: Once | OROMUCOSAL | Status: AC

## 2020-10-22 MED ORDER — LIDOCAINE HCL (PF) 2 % IJ SOLN
INTRAMUSCULAR | Status: AC
  Filled 2020-10-22: qty 5

## 2020-10-22 MED ORDER — OXYCODONE HCL 5 MG/5ML PO SOLN: 5.0000 mg | Freq: Once | ORAL | Status: AC | PRN

## 2020-10-22 MED ORDER — OXYCODONE HCL 5 MG PO TABS
ORAL_TABLET | ORAL | Status: AC
  Filled 2020-10-22: qty 1

## 2020-10-22 MED ORDER — KETOROLAC TROMETHAMINE 30 MG/ML IJ SOLN
INTRAMUSCULAR | Status: DC | PRN
  Administered 2020-10-22: 30 mg via INTRAVENOUS

## 2020-10-22 MED ORDER — FAMOTIDINE 20 MG PO TABS: 20.0000 mg | ORAL_TABLET | Freq: Once | ORAL | Status: AC

## 2020-10-22 MED ORDER — KETOROLAC TROMETHAMINE 30 MG/ML IJ SOLN
INTRAMUSCULAR | Status: AC
  Filled 2020-10-22: qty 1

## 2020-10-22 MED ORDER — ONDANSETRON HCL 4 MG/2ML IJ SOLN
INTRAMUSCULAR | Status: DC | PRN
  Administered 2020-10-22: 4 mg via INTRAVENOUS

## 2020-10-22 MED ORDER — CEFAZOLIN SODIUM-DEXTROSE 2-4 GM/100ML-% IV SOLN
2.0000 g | Freq: Once | INTRAVENOUS | Status: AC
  Administered 2020-10-22: 2 g via INTRAVENOUS

## 2020-10-22 MED ORDER — PROPOFOL 500 MG/50ML IV EMUL
INTRAVENOUS | Status: AC
  Filled 2020-10-22: qty 50

## 2020-10-22 MED ORDER — DEXAMETHASONE SODIUM PHOSPHATE 10 MG/ML IJ SOLN
INTRAMUSCULAR | Status: AC
  Filled 2020-10-22: qty 1

## 2020-10-22 MED ORDER — FENTANYL CITRATE (PF) 100 MCG/2ML IJ SOLN
25.0000 ug | INTRAMUSCULAR | Status: DC | PRN
  Administered 2020-10-22: 50 ug via INTRAVENOUS

## 2020-10-22 MED ORDER — PROPOFOL 10 MG/ML IV BOLUS
INTRAVENOUS | Status: DC | PRN
  Administered 2020-10-22: 200 mg via INTRAVENOUS

## 2020-10-22 MED ORDER — GLYCOPYRROLATE 0.2 MG/ML IJ SOLN
INTRAMUSCULAR | Status: DC | PRN
  Administered 2020-10-22: .2 mg via INTRAVENOUS

## 2020-10-22 MED ORDER — MEPERIDINE HCL 25 MG/ML IJ SOLN: 6.2500 mg | INTRAMUSCULAR | Status: DC | PRN

## 2020-10-22 MED ORDER — ACETAMINOPHEN 500 MG PO TABS: 1000.0000 mg | ORAL_TABLET | ORAL | Status: AC

## 2020-10-22 MED ORDER — ACETAMINOPHEN 500 MG PO TABS
ORAL_TABLET | ORAL | Status: AC
  Administered 2020-10-22: 1000 mg via ORAL
  Filled 2020-10-22: qty 2

## 2020-10-22 MED ORDER — FAMOTIDINE 20 MG PO TABS
ORAL_TABLET | ORAL | Status: AC
  Administered 2020-10-22: 20 mg via ORAL
  Filled 2020-10-22: qty 1

## 2020-10-22 MED ORDER — DEXAMETHASONE SODIUM PHOSPHATE 10 MG/ML IJ SOLN
INTRAMUSCULAR | Status: DC | PRN
  Administered 2020-10-22: 10 mg via INTRAVENOUS

## 2020-10-22 MED ORDER — LACTATED RINGERS IV SOLN
INTRAVENOUS | Status: DC
Start: 2020-10-22 — End: 2020-10-22

## 2020-10-22 MED ORDER — ROCURONIUM BROMIDE 10 MG/ML (PF) SYRINGE
PREFILLED_SYRINGE | INTRAVENOUS | Status: AC
  Filled 2020-10-22: qty 10

## 2020-10-22 MED ORDER — EPHEDRINE 5 MG/ML INJ
INTRAVENOUS | Status: AC
  Filled 2020-10-22: qty 5

## 2020-10-22 MED ORDER — SUGAMMADEX SODIUM 200 MG/2ML IV SOLN
INTRAVENOUS | Status: DC | PRN
  Administered 2020-10-22: 200 mg via INTRAVENOUS

## 2020-10-22 MED ORDER — HYDROMORPHONE HCL 1 MG/ML IJ SOLN
INTRAMUSCULAR | Status: DC | PRN
  Administered 2020-10-22: 1 mg via INTRAVENOUS

## 2020-10-22 MED ORDER — HYDROMORPHONE HCL 1 MG/ML IJ SOLN
INTRAMUSCULAR | Status: AC
  Filled 2020-10-22: qty 1

## 2020-10-22 MED ORDER — GABAPENTIN 300 MG PO CAPS
ORAL_CAPSULE | ORAL | Status: AC
  Administered 2020-10-22: 300 mg via ORAL
  Filled 2020-10-22: qty 1

## 2020-10-22 MED ORDER — EPHEDRINE SULFATE 50 MG/ML IJ SOLN
INTRAMUSCULAR | Status: DC | PRN
  Administered 2020-10-22: 10 mg via INTRAVENOUS
  Administered 2020-10-22: 5 mg via INTRAVENOUS

## 2020-10-22 MED ORDER — MIDAZOLAM HCL 2 MG/2ML IJ SOLN
INTRAMUSCULAR | Status: DC | PRN
  Administered 2020-10-22: 2 mg via INTRAVENOUS

## 2020-10-22 MED ORDER — PROPOFOL 10 MG/ML IV BOLUS
INTRAVENOUS | Status: AC
  Filled 2020-10-22: qty 20

## 2020-10-22 MED ORDER — FENTANYL CITRATE (PF) 100 MCG/2ML IJ SOLN
INTRAMUSCULAR | Status: DC | PRN
  Administered 2020-10-22 (×2): 50 ug via INTRAVENOUS

## 2020-10-22 MED ORDER — ONDANSETRON HCL 4 MG/2ML IJ SOLN
INTRAMUSCULAR | Status: AC
  Filled 2020-10-22: qty 2

## 2020-10-22 MED ORDER — LACTATED RINGERS IV SOLN: INTRAVENOUS | Status: DC

## 2020-10-22 MED ORDER — POVIDONE-IODINE 10 % EX SWAB
2.0000 "application " | Freq: Once | CUTANEOUS | Status: AC
  Administered 2020-10-22: 2 via TOPICAL

## 2020-10-22 MED ORDER — LIDOCAINE-EPINEPHRINE 1 %-1:100000 IJ SOLN
INTRAMUSCULAR | Status: AC
  Filled 2020-10-22: qty 1

## 2020-10-22 MED ORDER — OXYCODONE HCL 5 MG PO TABS
5.0000 mg | ORAL_TABLET | Freq: Once | ORAL | Status: AC | PRN
  Administered 2020-10-22: 5 mg via ORAL

## 2020-10-22 MED ORDER — LIDOCAINE HCL (CARDIAC) PF 100 MG/5ML IV SOSY
PREFILLED_SYRINGE | INTRAVENOUS | Status: DC | PRN
  Administered 2020-10-22: 100 mg via INTRAVENOUS

## 2020-10-22 SURGICAL SUPPLY — 42 items
BACTOSHIELD CHG 4% 4OZ (MISCELLANEOUS) ×1
BAG DRN RND TRDRP ANRFLXCHMBR (UROLOGICAL SUPPLIES) ×2
BAG URINE DRAIN 2000ML AR STRL (UROLOGICAL SUPPLIES) ×3 IMPLANT
CANISTER SUCT 1200ML W/VALVE (MISCELLANEOUS) ×3 IMPLANT
CATH FOLEY 2WAY  5CC 16FR (CATHETERS) ×1
CATH FOLEY 2WAY 5CC 16FR (CATHETERS) ×2
CATH ROBINSON RED A/P 16FR (CATHETERS) ×3 IMPLANT
CATH URTH 16FR FL 2W BLN LF (CATHETERS) ×2 IMPLANT
DRAPE PERI LITHO V/GYN (MISCELLANEOUS) ×3 IMPLANT
DRAPE SURG 17X11 SM STRL (DRAPES) ×3 IMPLANT
DRAPE UNDER BUTTOCK W/FLU (DRAPES) ×3 IMPLANT
ELECT REM PT RETURN 9FT ADLT (ELECTROSURGICAL) ×3
ELECTRODE REM PT RTRN 9FT ADLT (ELECTROSURGICAL) ×2 IMPLANT
GAUZE 4X4 16PLY ~~LOC~~+RFID DBL (SPONGE) ×6 IMPLANT
GLOVE SURG SYN 8.0 (GLOVE) ×3 IMPLANT
GOWN STRL REUS W/ TWL LRG LVL3 (GOWN DISPOSABLE) ×6 IMPLANT
GOWN STRL REUS W/ TWL XL LVL3 (GOWN DISPOSABLE) ×2 IMPLANT
GOWN STRL REUS W/TWL LRG LVL3 (GOWN DISPOSABLE) ×9
GOWN STRL REUS W/TWL XL LVL3 (GOWN DISPOSABLE) ×3
KIT PINK PAD W/HEAD ARE REST (MISCELLANEOUS) ×3
KIT PINK PAD W/HEAD ARM REST (MISCELLANEOUS) ×2 IMPLANT
KIT TURNOVER CYSTO (KITS) ×3 IMPLANT
LABEL OR SOLS (LABEL) ×3 IMPLANT
MANIFOLD NEPTUNE II (INSTRUMENTS) ×3 IMPLANT
NEEDLE HYPO 22GX1.5 SAFETY (NEEDLE) ×3 IMPLANT
PACK BASIN MINOR ARMC (MISCELLANEOUS) ×3 IMPLANT
PAD OB MATERNITY 4.3X12.25 (PERSONAL CARE ITEMS) ×3 IMPLANT
PAD PREP 24X41 OB/GYN DISP (PERSONAL CARE ITEMS) ×3 IMPLANT
SCRUB CHG 4% DYNA-HEX 4OZ (MISCELLANEOUS) ×2 IMPLANT
SOL PREP PROV IODINE SCRUB 4OZ (MISCELLANEOUS) ×3 IMPLANT
SOL PREP PVP 2OZ (MISCELLANEOUS) ×3
SOLUTION PREP PVP 2OZ (MISCELLANEOUS) ×2 IMPLANT
SURGILUBE 2OZ TUBE FLIPTOP (MISCELLANEOUS) ×3 IMPLANT
SUT PDS 2-0 27IN (SUTURE) ×3 IMPLANT
SUT VIC AB 0 CT1 27 (SUTURE) ×9
SUT VIC AB 0 CT1 27XCR 8 STRN (SUTURE) ×6 IMPLANT
SUT VIC AB 0 CT1 36 (SUTURE) ×3 IMPLANT
SUT VIC AB 2-0 SH 27 (SUTURE) ×6
SUT VIC AB 2-0 SH 27XBRD (SUTURE) ×4 IMPLANT
SYR 10ML LL (SYRINGE) ×3 IMPLANT
SYR CONTROL 10ML LL (SYRINGE) ×3 IMPLANT
WATER STERILE IRR 1000ML POUR (IV SOLUTION) ×3 IMPLANT

## 2020-10-22 NOTE — Brief Op Note (Signed)
10/22/2020  9:10 AM  PATIENT:  Emma Fowler  35 y.o. female  PRE-OPERATIVE DIAGNOSIS:  menorrhagia, dysmenorrhea  POST-OPERATIVE DIAGNOSIS:  menorrhagia, dysmenorrhea  PROCEDURE:  Procedure(s): HYSTERECTOMY VAGINAL (N/A) BILATERAL SALPINGECTOMY (Bilateral)  SURGEON:  Surgeon(s) and Role:    * Bonnie Roig, Ihor Austin, MD - Primary    * Christeen Douglas, MD - Assisting  PHYSICIAN ASSISTANT:   ASSISTANTS: cst  ANESTHESIA:   general  EBL:  25 cc IOF 600 cc UO 350 cc  BLOOD ADMINISTERED:none  DRAINS: none LOCAL MEDICATIONS USED:  LIDOCAINE   SPECIMEN:  Source of Specimen:  bilateral fallopian tubes a, cervix and uterus  DISPOSITION OF SPECIMEN:  PATHOLOGY  COUNTS:  YES  TOURNIQUET:  * No tourniquets in log *  DICTATION: .Other Dictation: Dictation Number verbal  PLAN OF CARE: Discharge to home after PACU  PATIENT DISPOSITION:  PACU - hemodynamically stable.   Delay start of Pharmacological VTE agent (>24hrs) due to surgical blood loss or risk of bleeding: not applicable

## 2020-10-22 NOTE — Anesthesia Preprocedure Evaluation (Signed)
Anesthesia Evaluation  Patient identified by MRN, date of birth, ID band Patient awake    Reviewed: Allergy & Precautions, NPO status , Patient's Chart, lab work & pertinent test results  History of Anesthesia Complications Negative for: history of anesthetic complications  Airway Mallampati: I  TM Distance: >3 FB Neck ROM: Full    Dental  (+) Teeth Intact   Pulmonary neg sleep apnea, neg COPD, Current Smoker and Patient abstained from smoking.,    Pulmonary exam normal breath sounds clear to auscultation       Cardiovascular Exercise Tolerance: Good METS(-) hypertension(-) CAD and (-) Past MI negative cardio ROS  (-) dysrhythmias  Rhythm:Regular Rate:Normal - Systolic murmurs    Neuro/Psych PSYCHIATRIC DISORDERS Depression negative neurological ROS     GI/Hepatic hiatal hernia, neg GERD  ,(+)     (-) substance abuse  ,   Endo/Other  neg diabetes  Renal/GU negative Renal ROS     Musculoskeletal   Abdominal   Peds  Hematology  (+) anemia ,   Anesthesia Other Findings Past Medical History: No date: Anemia No date: Bronchitis No date: Complication of anesthesia     Comment:  takes longer to wake up No date: Depression No date: History of hiatal hernia No date: Recurrent UTI No date: Substance abuse (HCC)     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  Reproductive/Obstetrics                             Anesthesia Physical Anesthesia Plan  ASA: 2  Anesthesia Plan: General   Post-op Pain Management:    Induction: Intravenous  PONV Risk Score and Plan: 3 and Ondansetron, Dexamethasone and Midazolam  Airway Management Planned: Oral ETT  Additional Equipment: None  Intra-op Plan:   Post-operative Plan: Extubation in OR  Informed Consent: I have reviewed the patients History and Physical, chart, labs and discussed the procedure including the risks, benefits and alternatives for the  proposed anesthesia with the patient or authorized representative who has indicated his/her understanding and acceptance.     Dental advisory given  Plan Discussed with: CRNA and Surgeon  Anesthesia Plan Comments: (Discussed risks of anesthesia with patient, including PONV, sore throat, lip/dental damage. Rare risks discussed as well, such as cardiorespiratory and neurological sequelae, and allergic reactions. Patient understands.)        Anesthesia Quick Evaluation

## 2020-10-22 NOTE — Anesthesia Procedure Notes (Signed)
Procedure Name: Intubation Date/Time: 10/22/2020 8:11 AM Performed by: Joanette Gula, Aliha Diedrich, CRNA Pre-anesthesia Checklist: Patient identified, Emergency Drugs available, Suction available and Patient being monitored Patient Re-evaluated:Patient Re-evaluated prior to induction Oxygen Delivery Method: Circle system utilized Preoxygenation: Pre-oxygenation with 100% oxygen Induction Type: IV induction Ventilation: Mask ventilation without difficulty Laryngoscope Size: McGraph and 3 Grade View: Grade I Tube type: Oral Tube size: 6.5 mm Number of attempts: 1 Airway Equipment and Method: Stylet Placement Confirmation: ETT inserted through vocal cords under direct vision, positive ETCO2 and breath sounds checked- equal and bilateral Secured at: 22 cm Tube secured with: Tape Dental Injury: Teeth and Oropharynx as per pre-operative assessment

## 2020-10-22 NOTE — Transfer of Care (Signed)
Immediate Anesthesia Transfer of Care Note  Patient: Emma Fowler  Procedure(s) Performed: HYSTERECTOMY VAGINAL BILATERAL SALPINGECTOMY (Bilateral)  Patient Location: PACU  Anesthesia Type:General  Level of Consciousness: drowsy  Airway & Oxygen Therapy: Patient Spontanous Breathing and Patient connected to face mask oxygen  Post-op Assessment: Report given to RN and Post -op Vital signs reviewed and stable  Post vital signs: Reviewed and stable  Last Vitals:  Vitals Value Taken Time  BP 109/59 10/22/20 0921  Temp 36.2 C 10/22/20 0921  Pulse 65 10/22/20 0923  Resp 15 10/22/20 0923  SpO2 100 % 10/22/20 0923  Vitals shown include unvalidated device data.  Last Pain:  Vitals:   10/22/20 0725  TempSrc: Temporal  PainSc: 0-No pain         Complications: No notable events documented.

## 2020-10-22 NOTE — Progress Notes (Signed)
Patient was only able to urinate 100 cc so I bladder scanned her and she only has 17 cc in her bladder so I sent her home and told her if she has any problems please call the office.

## 2020-10-22 NOTE — Anesthesia Postprocedure Evaluation (Signed)
Anesthesia Post Note  Patient: Emma Fowler  Procedure(s) Performed: HYSTERECTOMY VAGINAL BILATERAL SALPINGECTOMY (Bilateral)  Patient location during evaluation: PACU Anesthesia Type: General Level of consciousness: awake and alert and oriented Pain management: pain level controlled Vital Signs Assessment: post-procedure vital signs reviewed and stable Respiratory status: spontaneous breathing, nonlabored ventilation and respiratory function stable Cardiovascular status: blood pressure returned to baseline and stable Postop Assessment: no signs of nausea or vomiting Anesthetic complications: no   No notable events documented.   Last Vitals:  Vitals:   10/22/20 0945 10/22/20 1000  BP: 112/61   Pulse: 75 72  Resp: (!) 22 14  Temp:    SpO2: 100% 100%    Last Pain:  Vitals:   10/22/20 1000  TempSrc:   PainSc: 7                  Jakarius Flamenco

## 2020-10-22 NOTE — Discharge Instructions (Signed)

## 2020-10-22 NOTE — Progress Notes (Signed)
Pt here for TVH and BS . NPO . Labs reviewed . Allquestions answered . Proceed

## 2020-10-22 NOTE — Op Note (Signed)
NAMEGRISELLE, Emma Fowler MEDICAL RECORD NO: [id number] ACCOUNT NO: [id number] DATE OF BIRTH: 1985-03-14 FACILITY: [facility] LOCATION: [facility] PHYSICIAN: Suzy Bouchard, MD  Operative Report   DATE OF PROCEDURE: 10/22/2020  PREOPERATIVE DIAGNOSIS:  Menorrhagia.  POSTOPERATIVE DIAGNOSIS:  Menorrhagia.  PROCEDURES: 1.  Total vaginal hysterectomy. 2.  Bilateral salpingectomy.  ANESTHESIA:  General endotracheal anesthesia.  SURGEON:  Suzy Bouchard, MD  ASSISTANT:  Christeen Douglas, MD  INDICATIONS:  A 35 year old gravida 5, para 4, patient with a long history of menorrhagia, desires definitive surgical intervention.  DESCRIPTION OF PROCEDURE:  After adequate general endotracheal anesthesia, the patient was placed in dorsal supine position with the legs in the candy cane stirrups.  The patient's abdomen, perineum and vagina were prepped and draped in normal sterile  fashion.  A timeout was performed.  The patient did receive 2 grams of IV Ancef for surgical prophylaxis before commencement.  A weighted speculum was placed in the posterior vaginal vault and the cervix was grasped with 2 thyroid tenacula.  Cervix was  circumferentially injected with 0.5% lidocaine with 1:100,000 epinephrine.  Bladder was drained before commencement yielding 300 mL clear urine.  Posterior cul-de-sac was entered sharply and a long billed weighted speculum was placed.  The uterosacral  ligaments were bilaterally clamped, transected and suture ligated with 0 Vicryl suture and tagged for later identification.  The anterior cervix was circumferentially incised.  Cardinal ligaments were bilaterally clamped, transected and suture ligated  with 0 Vicryl suture.  Anterior cul-de-sac was entered sharply and Deaver retractor was placed to elevate the bladder anteriorly.  Uterine arteries were then bilaterally clamped, transected and suture ligated with 0 Vicryl suture.  Sequential clamping of  the lateral  aspect of the uterus ensued.  Ultimately, the cornua were bilaterally clamped and the cervix and uterus were dissected free and the uterus was passed off the operative field.  The pedicles were ligated with 0 Vicryl suture.  Ovaries appeared  normal.  Good hemostasis was noted.  The peritoneal edge was then closed with a 2-0 PDS suture in a pursestring fashion.  The vaginal cuff was then closed. Each distal portion of the fallopian tube was grasped with a Babcock clamp and transected and  passed off the operative field.  Pedicles were ligated with 0 Vicryl suture.  The peritoneum was closed with a 2-0 PDS suture in a pursestring fashion.  Vaginal cuff was then closed with a running 0 Vicryl suture.  Good approximation of edges.  Good  hemostasis noted.  The uterosacral ligaments were plicated centrally and the rest of the vaginal vault was closed with the 0 Vicryl suture.  There were no complications.  Good hemostasis was noted.  Straight catheterization of the bladder at the end of  the case yielded additional 50 mL urine.  ESTIMATED BLOOD LOSS:  25 mL.  INTRAOPERATIVE FLUIDS:  600 mL.  URINE OUTPUT:  350 mL.  The patient did receive 30 mg intravenous Toradol at the end of the case and there were no complications.  She was taken to recovery room in good condition.   PUS D: 10/22/2020 9:46:33 am T: 10/22/2020 1:53:00 pm  JOB: [id number]/ [id number]

## 2020-10-23 ENCOUNTER — Encounter: Payer: Self-pay | Admitting: Obstetrics and Gynecology

## 2020-10-23 LAB — SURGICAL PATHOLOGY

## 2020-11-30 ENCOUNTER — Encounter: Payer: Self-pay | Admitting: General Surgery

## 2021-11-06 ENCOUNTER — Encounter: Payer: Self-pay | Admitting: Oncology

## 2021-11-06 ENCOUNTER — Encounter: Payer: Self-pay | Admitting: Family Medicine

## 2021-11-19 ENCOUNTER — Ambulatory Visit: Payer: Medicaid Other | Admitting: Gastroenterology

## 2021-11-19 ENCOUNTER — Other Ambulatory Visit: Payer: Self-pay

## 2021-12-16 IMAGING — NM NM HEPATO W/GB/PHARM/[PERSON_NAME]
2 series · 12 of 12 positions shown · non-contrast
Comparison: None.

CLINICAL DATA: Postprandial right upper quadrant pain.

EXAM:
NUCLEAR MEDICINE HEPATOBILIARY IMAGING WITH GALLBLADDER EF
TECHNIQUE: Sequential images of the abdomen were obtained [DATE] minutes
following intravenous administration of radiopharmaceutical. After
oral ingestion of Ensure, gallbladder ejection fraction was
determined. At 60 min, normal ejection fraction is greater than 33%.
RADIOPHARMACEUTICALS:  5.0 mCi Ac-JJm  Choletec IV

[Series 1000: hepatobiliary scan · 9.59mm/px · 6 of 60 frames shown]
[frame 6/60]
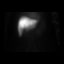
[frame 16/60]
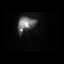
[frame 26/60]
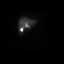
[frame 36/60]
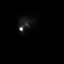
[frame 46/60]
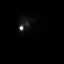
[frame 56/60]
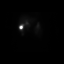

[Series 1000: gallbladder ef · 4.80mm/px · 6 of 120 frames shown]
[frame 11/120]
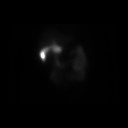
[frame 31/120]
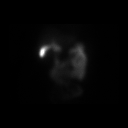
[frame 51/120]
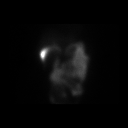
[frame 71/120]
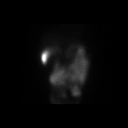
[frame 91/120]
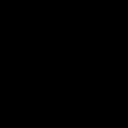
[frame 111/120]
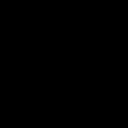

[12 of 12 positions shown; findings below may reference images not displayed]

FINDINGS: Prompt uptake and biliary excretion of activity by the liver is
seen. Gallbladder activity is visualized, consistent with patency of
cystic duct. Biliary activity passes into small bowel, consistent
with patent common bile duct.

Calculated gallbladder ejection fraction is 50%. (Normal gallbladder
ejection fraction with Ensure is greater than 33%.)
IMPRESSION: Normal hepatobiliary scan, demonstrating patency of cystic and
common bile ducts.

Normal gallbladder ejection fraction.

## 2021-12-17 ENCOUNTER — Ambulatory Visit: Payer: Medicaid Other | Admitting: Gastroenterology

## 2021-12-25 ENCOUNTER — Encounter: Payer: Self-pay | Admitting: Oncology

## 2021-12-25 NOTE — Telephone Encounter (Signed)
Signing encounter, See previous note 07/21/21

## 2022-02-20 ENCOUNTER — Encounter: Payer: Self-pay | Admitting: Oncology

## 2022-03-15 IMAGING — DX DG CHEST 1V
1 series · 1 of 1 positions shown · non-contrast
Comparison: November 04, 2015 chest radiograph; CT abdomen and pelvis
September 22, 2019

CLINICAL DATA: Cough and congestion

EXAM:
CHEST  1 VIEW

[chest ap]
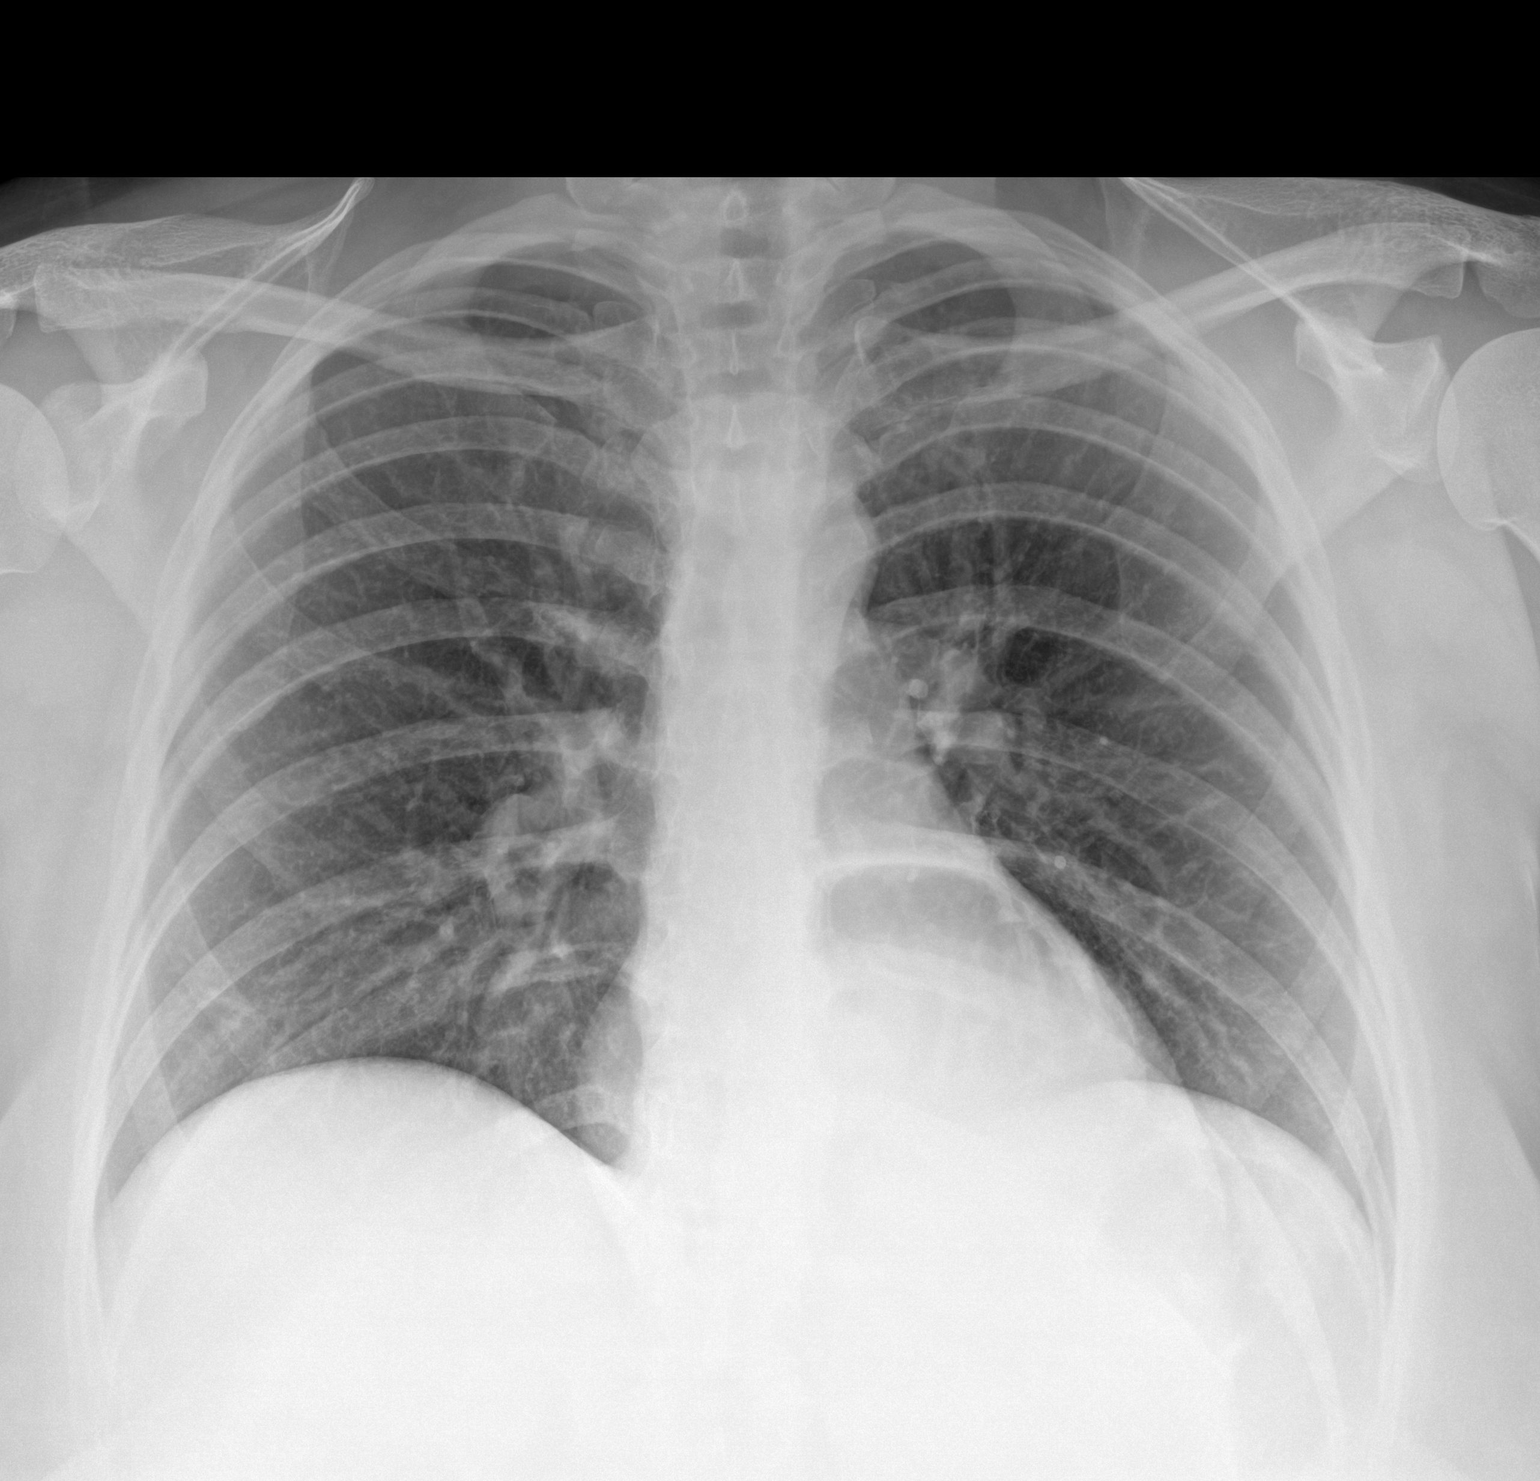

[1 of 1 positions shown; findings below may reference images not displayed]

FINDINGS: Lungs are clear. Heart size and pulmonary vascularity are normal. No
adenopathy. There is a paraesophageal hernia, documented on previous
CT. No bone lesions. No pneumothorax.
IMPRESSION: Paraesophageal hernia.  Lungs clear.  Heart size normal.

## 2022-04-14 ENCOUNTER — Telehealth: Payer: Self-pay | Admitting: Oncology

## 2022-04-14 NOTE — Telephone Encounter (Signed)
pt last seen 04/2019 would like to see if she can get Iron INf, please advise.

## 2022-04-15 ENCOUNTER — Other Ambulatory Visit: Payer: Self-pay | Admitting: *Deleted

## 2022-04-15 DIAGNOSIS — D509 Iron deficiency anemia, unspecified: Secondary | ICD-10-CM

## 2022-04-22 ENCOUNTER — Inpatient Hospital Stay: Payer: Medicaid Other | Admitting: Oncology

## 2022-04-22 ENCOUNTER — Inpatient Hospital Stay: Payer: Medicaid Other | Attending: Oncology

## 2022-04-22 IMAGING — CR DG CHEST 2V
2 series · 2 of 2 positions shown · non-contrast
Comparison: February 04, 2020

CLINICAL DATA: Shortness of breath and chest pain. Reported
[id number] positive

EXAM:
CHEST - 2 VIEW

[chest pa]
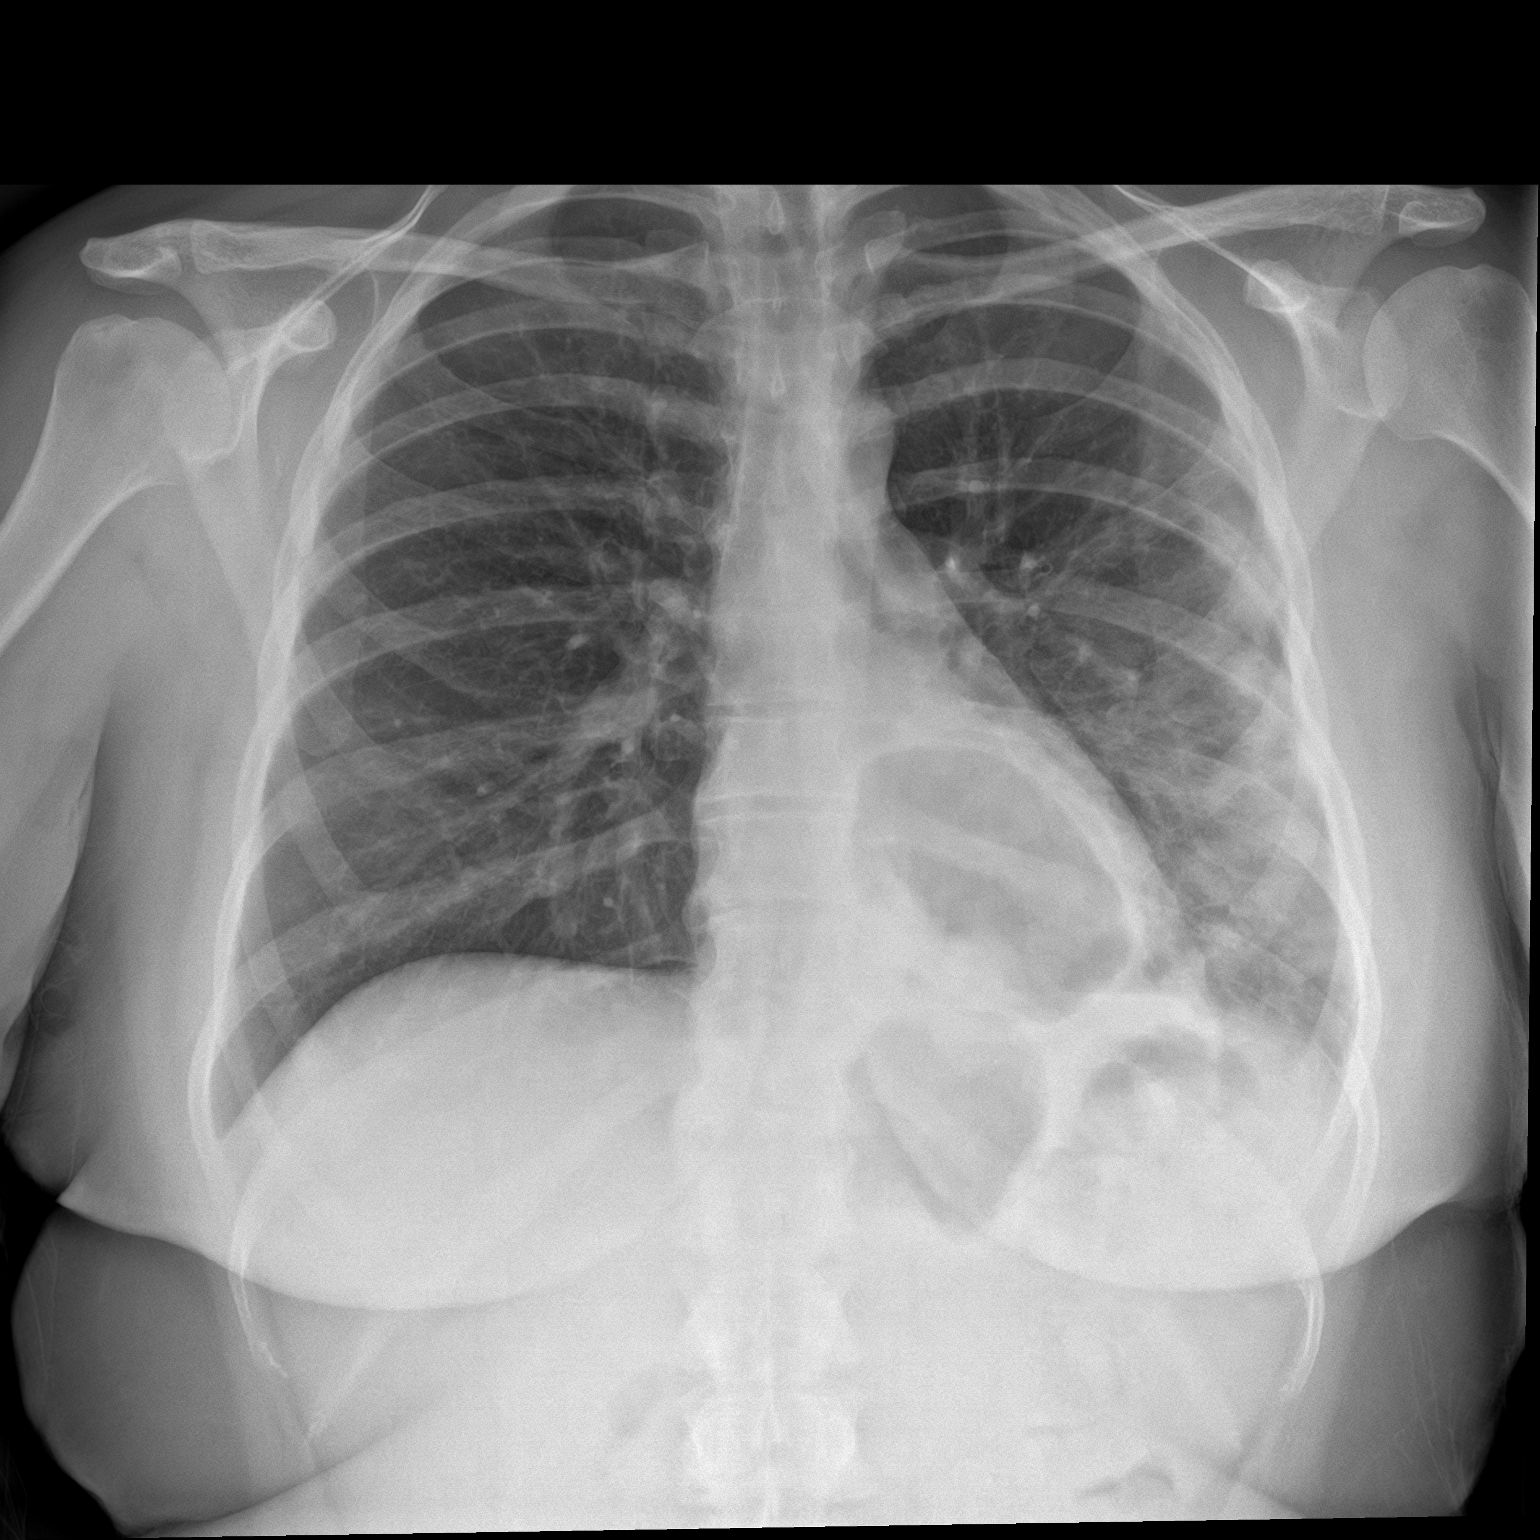

[chest lat]
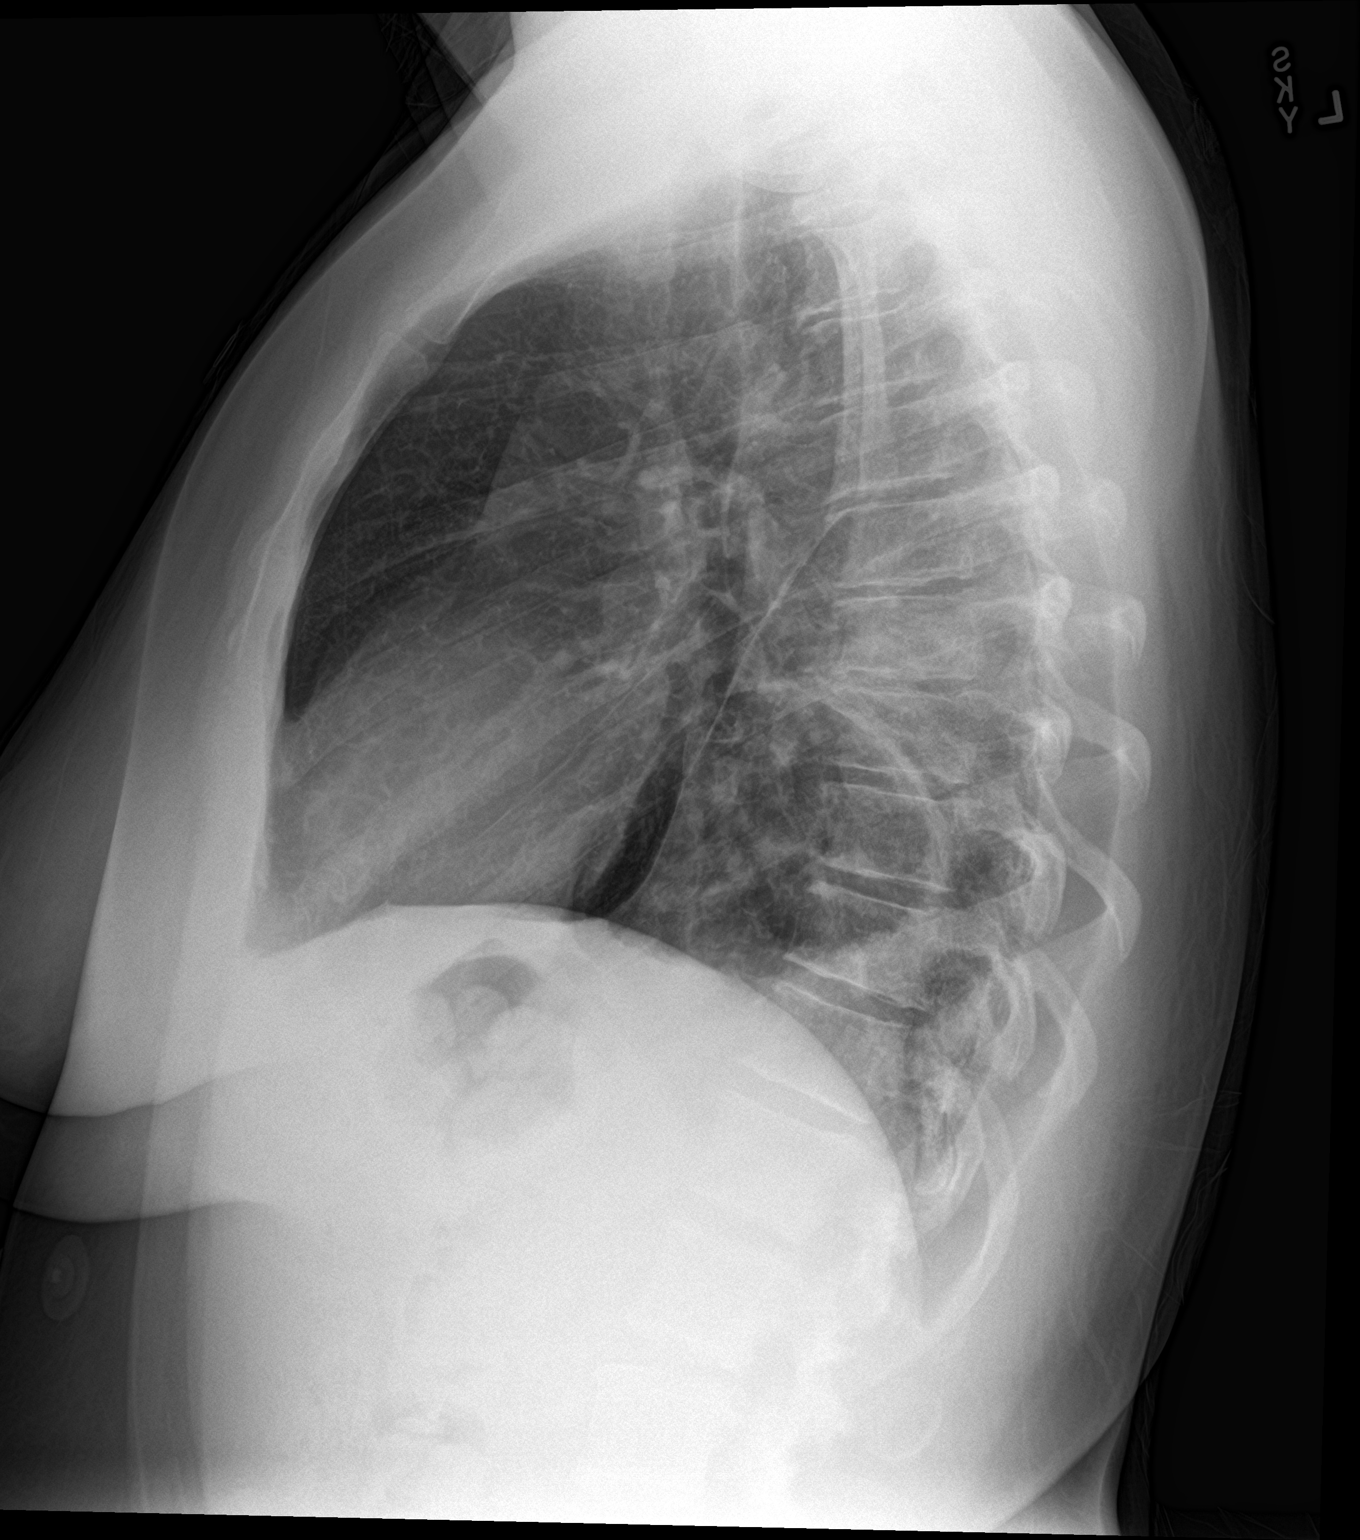

[2 of 2 positions shown; findings below may reference images not displayed]

FINDINGS: There is airspace opacity in portions of the left mid and lower lung
regions without consolidation. Right lung clear. Heart size and
pulmonary vascularity are normal. No adenopathy. There is a fairly
sizable hiatal type hernia. No bone lesions.
IMPRESSION: Airspace opacity in portions of left mid and lower lung regions.
Suspect atypical organism pneumonia. Right lung clear. Heart size
normal. Hiatal hernia present.

## 2022-08-05 ENCOUNTER — Encounter: Payer: Self-pay | Admitting: Oncology

## 2022-08-08 ENCOUNTER — Other Ambulatory Visit: Payer: Self-pay

## 2022-08-18 ENCOUNTER — Inpatient Hospital Stay: Payer: Medicaid Other | Attending: Oncology

## 2022-08-18 ENCOUNTER — Inpatient Hospital Stay: Payer: Medicaid Other | Admitting: Oncology

## 2022-08-18 ENCOUNTER — Encounter: Payer: Self-pay | Admitting: Oncology

## 2022-11-17 ENCOUNTER — Encounter: Payer: Self-pay | Admitting: Gastroenterology

## 2022-11-17 ENCOUNTER — Other Ambulatory Visit: Payer: Self-pay

## 2022-11-17 ENCOUNTER — Ambulatory Visit (INDEPENDENT_AMBULATORY_CARE_PROVIDER_SITE_OTHER): Payer: Medicaid Other | Admitting: Gastroenterology

## 2022-11-17 VITALS — BP 115/78 | HR 81 | Temp 98.1°F | Ht 64.0 in | Wt 209.0 lb

## 2022-11-17 DIAGNOSIS — D509 Iron deficiency anemia, unspecified: Secondary | ICD-10-CM

## 2022-11-17 DIAGNOSIS — K5909 Other constipation: Secondary | ICD-10-CM

## 2022-11-17 DIAGNOSIS — K5904 Chronic idiopathic constipation: Secondary | ICD-10-CM

## 2022-11-17 MED ORDER — NA SULFATE-K SULFATE-MG SULF 17.5-3.13-1.6 GM/177ML PO SOLN: 354.0000 mL | Freq: Once | ORAL | 0 refills | Status: AC

## 2022-11-17 NOTE — Progress Notes (Unsigned)
Arlyss Repress, MD [address]  [address]  [place], [place] [address]  Main: [phone]  Fax: [phone]    Gastroenterology Consultation  Referring Provider:     Resa Miner, MD Primary Care Physician:  Resa Miner, MD Primary Gastroenterologist:  Dr. Arlyss Repress Reason for Consultation: Chronic iron deficiency anemia, severe constipation, left lower quadrant pain        HPI:   Emma Fowler is a 37 y.o. female referred by Dr. Danie Chandler, Unk Pinto, MD  for consultation & management of chronic iron deficiency anemia.  Patient was originally seen by me in 05/2019 for evaluation of iron deficiency anemia, chronic left lower quadrant pain and constipation.  She also had a history of menorrhagia, underwent total abdominal hysterectomy with bilateral salpingo-oophorectomy in 10/2020.  Her labs from 10/2021 revealed microcytic anemia, hemoglobin of 10, normal platelets, low MCV.  She did not have celiac disease based on the blood test.  When I saw her in 2021, I have recommended upper endoscopy as well as colonoscopy which she did not undergo at that time.  Today, patient is here to discuss about severe constipation, she did not have a bowel movement for 2 weeks, has been taking laxatives, finally had a small bowel movement today.  Reports left lower quadrant pain, abdominal bloating as well.  She does have persistent microcytic anemia.  Patient is not taking any iron supplements because it worsened her constipation.  She denies any rectal bleeding  NSAIDs: None  Antiplts/Anticoagulants/Anti thrombotics: None  GI Procedures: None  Past Medical History:  Diagnosis Date   Anemia    Bronchitis    Complication of anesthesia    takes longer to wake up   Depression    History of hiatal hernia    Recurrent UTI    Substance abuse (HCC)    narcotics-on Suboxone    Past Surgical History:  Procedure Laterality Date   BILATERAL SALPINGECTOMY Bilateral 10/22/2020    Procedure: BILATERAL SALPINGECTOMY;  Surgeon: Schermerhorn, Ihor Austin, MD;  Location: [facility];  Service: Gynecology;  Laterality: Bilateral;   DILATION AND CURETTAGE OF UTERUS     TONSILLECTOMY     TUBAL LIGATION     VAGINAL HYSTERECTOMY N/A 10/22/2020   Procedure: HYSTERECTOMY VAGINAL;  Surgeon: Schermerhorn, Ihor Austin, MD;  Location: [facility];  Service: Gynecology;  Laterality: N/A;     Current Outpatient Medications:    buprenorphine-naloxone (SUBOXONE) 8-2 mg SUBL SL tablet, Place 1.5 tablets under the tongue daily., Disp: , Rfl:    Semaglutide-Weight Management (WEGOVY) 0.25 MG/0.5ML SOAJ, Inject 0.25 mg into the skin once a week., Disp: , Rfl:    Family History  Problem Relation Age of Onset   ADD / ADHD Son      Social History   Tobacco Use   Smoking status: Former    Current packs/day: 0.00    Average packs/day: 0.5 packs/day for 9.0 years (4.5 ttl pk-yrs)    Types: Cigarettes    Quit date: 07/09/2022    Years since quitting: 0.3   Smokeless tobacco: Never  Vaping Use   Vaping status: Every Day   Substances: Nicotine, Flavoring  Substance Use Topics   Alcohol use: Yes    Comment: occ   Drug use: Not Currently    Types: Other-see comments    Comment: narcotic abuse-taking Suboxone    Allergies as of 11/17/2022   (No Known Allergies)    Review of Systems:  All systems reviewed and negative except where noted in HPI.   Physical Exam:  BP 115/78 (BP Location: Left Arm, Patient Position: Sitting, Cuff Size: Normal)   Pulse 81   Temp 98.1 F (36.7 C) (Oral)   Ht 5\' 4"  (1.626 m)   Wt 209 lb (94.8 kg)   BMI 35.87 kg/m  No LMP recorded.  General:   Alert,  Well-developed, well-nourished, pleasant and cooperative in NAD Head:  Normocephalic and atraumatic. Eyes:  Sclera clear, no icterus.   Conjunctiva pink. Ears:  Normal auditory acuity. Nose:  No deformity, discharge, or lesions. Mouth:  No deformity or lesions,oropharynx pink & moist. Neck:  Supple;  no masses or thyromegaly. Lungs:  Respirations even and unlabored.  Clear throughout to auscultation.   No wheezes, crackles, or rhonchi. No acute distress. Heart:  Regular rate and rhythm; no murmurs, clicks, rubs, or gallops. Abdomen:  Normal bowel sounds. Soft, non-tender and non-distended without masses, hepatosplenomegaly or hernias noted.  No guarding or rebound tenderness.   Rectal: Not performed Msk:  Symmetrical without gross deformities. Good, equal movement & strength bilaterally. Pulses:  Normal pulses noted. Extremities:  No clubbing or edema.  No cyanosis. Neurologic:  Alert and oriented x3;  grossly normal neurologically. Skin:  Intact without significant lesions or rashes. No jaundice. Psych:  Alert and cooperative. Normal mood and affect.  Imaging Studies: Reviewed  Assessment and Plan:   Emma Fowler is a 37 y.o. female with obesity, iron deficiency anemia, history of menorrhagia status post total abdominal hysterectomy and bilateral salpingo-oophorectomy, chronic severe constipation is seen in consultation for persistent anemia and worsening of constipation  Chronic severe constipation Discussed about high-fiber diet, adequate intake of water up to 3 L daily Incorporate physical activity and exercise regularly She is interested in bowel cleanout, MiraLAX prep instructions were provided After cleanout, recommend to start Linzess 290 mcg daily, samples provided  Iron deficiency anemia Recheck CBC, iron panel, B12 and folate levels today Fusion plus samples were provided Discussed about upper endoscopy and colonoscopy with 2-day prep, patient is agreeable to undergo   Follow up with PA-C, Celso Amy in 3 months   Arlyss Repress, MD

## 2022-11-17 NOTE — Patient Instructions (Addendum)
Dr. Allegra Lai wants you to do a Miralax clean out before your colonoscopy. You will do clear liquids all day on day and then starting at 5:00pm you will Mix 64 ounces of Gatorade with 238 grams of miralax. Drink 8oz every 20 to 30 minutes till solution is gone.  After you have done the clean out then start the Linzess 290 samples. Let us know how they work for you and we can call you in a prescription for you.  Gave you samples of fusion plus.  Let us know how they work for you and we can call you in a prescription for you.

## 2022-11-18 ENCOUNTER — Telehealth: Payer: Self-pay

## 2022-11-18 LAB — CBC
Hematocrit: 37.8 % (ref 34.0–46.6)
Hemoglobin: 11.7 g/dL (ref 11.1–15.9)
MCH: 26.5 pg — ABNORMAL LOW (ref 26.6–33.0)
MCHC: 31 g/dL — ABNORMAL LOW (ref 31.5–35.7)
MCV: 86 fL (ref 79–97)
Platelets: 309 10*3/uL (ref 150–450)
RBC: 4.42 x10E6/uL (ref 3.77–5.28)
RDW: 11.9 % (ref 11.7–15.4)
WBC: 7.6 10*3/uL (ref 3.4–10.8)

## 2022-11-18 LAB — B12 AND FOLATE PANEL
Folate: 6.9 ng/mL (ref >3.0)
Vitamin B-12: 809 pg/mL (ref 232–1245)

## 2022-11-18 LAB — IRON,TIBC AND FERRITIN PANEL
Ferritin: 7 ng/mL — ABNORMAL LOW (ref 15–150)
Iron Saturation: 4 % — CL (ref 15–55)
Iron: 19 ug/dL — ABNORMAL LOW (ref 27–159)
Total Iron Binding Capacity: 475 ug/dL — ABNORMAL HIGH (ref 250–450)
UIBC: 456 ug/dL — ABNORMAL HIGH (ref 131–425)

## 2022-11-18 MED ORDER — FUSION PLUS PO CAPS: 1.0000 | ORAL_CAPSULE | Freq: Every day | ORAL | 2 refills | Status: DC

## 2022-11-18 NOTE — Telephone Encounter (Signed)
-----   Message from [facility] sent at 11/18/2022 12:11 PM EDT ----- Hemoglobin levels are improving, still has severe iron deficiency.  Continue fusion plus samples every other day and please send in prescription for fusion plus every other day for 1 month with 2 refills  RV

## 2022-11-18 NOTE — Telephone Encounter (Signed)
Patient verbalized understanding of results and will take the medication

## 2022-11-24 NOTE — Anesthesia Preprocedure Evaluation (Addendum)
Anesthesia Evaluation  Patient identified by MRN, date of birth, ID band Patient awake    Reviewed: Allergy & Precautions, H&P , NPO status , Patient's Chart, lab work & pertinent test results  History of Anesthesia Complications (+) history of anesthetic complications  Airway Mallampati: II  TM Distance: >3 FB Neck ROM: Full   Comment: Piercings have been removed.  Dental no notable dental hx.    Pulmonary Current Smoker   Pulmonary exam normal breath sounds clear to auscultation       Cardiovascular negative cardio ROS Normal cardiovascular exam Rhythm:Regular Rate:Normal     Neuro/Psych  PSYCHIATRIC DISORDERS  Depression     negative psych ROS   GI/Hepatic Neg liver ROS, hiatal hernia,,,  Endo/Other  negative endocrine ROS    Renal/GU negative Renal ROS  negative genitourinary   Musculoskeletal negative musculoskeletal ROS (+)    Abdominal   Peds negative pediatric ROS (+)  Hematology  (+) Blood dyscrasia, anemia   Anesthesia Other Findings Depression  Recurrent UTI Bronchitis  Anemia History of hiatal hernia  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 Complication of anesthesia--slow to awaken     Reproductive/Obstetrics negative OB ROS                             Anesthesia Physical Anesthesia Plan  ASA: 2  Anesthesia Plan: General   Post-op Pain Management:    Induction: Intravenous  PONV Risk Score and Plan:   Airway Management Planned: Natural Airway and Nasal Cannula  Additional Equipment:   Intra-op Plan:   Post-operative Plan:   Informed Consent: I have reviewed the patients History and Physical, chart, labs and discussed the procedure including the risks, benefits and alternatives for the proposed anesthesia with the patient or authorized representative who has indicated his/her understanding and acceptance.     Dental Advisory Given  Plan Discussed with:  Anesthesiologist, CRNA and Surgeon  Anesthesia Plan Comments: (Patient consented for risks of anesthesia including but not limited to:  - adverse reactions to medications - risk of airway placement if required - damage to eyes, teeth, lips or other oral mucosa - nerve damage due to positioning  - sore throat or hoarseness - Damage to heart, brain, nerves, lungs, other parts of body or loss of life  Patient voiced understanding.)       Anesthesia Quick Evaluation

## 2022-12-02 ENCOUNTER — Ambulatory Visit: Payer: Medicaid Other | Admitting: Anesthesiology

## 2022-12-02 ENCOUNTER — Other Ambulatory Visit: Payer: Self-pay

## 2022-12-02 ENCOUNTER — Encounter
Admission: RE | Disposition: A | Discharge status: Home / Self Care | Payer: Self-pay | Source: Home / Self Care | Attending: Gastroenterology

## 2022-12-02 ENCOUNTER — Encounter: Payer: Self-pay | Admitting: Gastroenterology

## 2022-12-02 ENCOUNTER — Ambulatory Visit
Admission: RE | Admit: 2022-12-02 | Discharge: 2022-12-02 | Disposition: A | Discharge status: Home / Self Care | Payer: Medicaid Other | Attending: Gastroenterology | Admitting: Gastroenterology

## 2022-12-02 DIAGNOSIS — F17210 Nicotine dependence, cigarettes, uncomplicated: Secondary | ICD-10-CM | POA: Diagnosis not present

## 2022-12-02 DIAGNOSIS — D509 Iron deficiency anemia, unspecified: Secondary | ICD-10-CM | POA: Diagnosis present

## 2022-12-02 DIAGNOSIS — K449 Diaphragmatic hernia without obstruction or gangrene: Secondary | ICD-10-CM | POA: Diagnosis not present

## 2022-12-02 HISTORY — DX: Diaphragmatic hernia without obstruction or gangrene: K44.9

## 2022-12-02 HISTORY — PX: ESOPHAGOGASTRODUODENOSCOPY (EGD) WITH PROPOFOL: SHX5813

## 2022-12-02 HISTORY — PX: COLONOSCOPY WITH PROPOFOL: SHX5780

## 2022-12-02 SURGERY — COLONOSCOPY WITH PROPOFOL
Anesthesia: General | Site: Throat

## 2022-12-02 MED ORDER — LIDOCAINE HCL (PF) 2 % IJ SOLN
INTRAMUSCULAR | Status: AC
  Filled 2022-12-02: qty 5

## 2022-12-02 MED ORDER — LACTATED RINGERS IV SOLN: INTRAVENOUS | Status: DC

## 2022-12-02 MED ORDER — LIDOCAINE HCL (CARDIAC) PF 100 MG/5ML IV SOSY
PREFILLED_SYRINGE | INTRAVENOUS | Status: DC | PRN
  Administered 2022-12-02: 100 mg via INTRAVENOUS

## 2022-12-02 MED ORDER — PROPOFOL 10 MG/ML IV BOLUS
INTRAVENOUS | Status: DC | PRN
  Administered 2022-12-02 (×2): 100 mg via INTRAVENOUS
  Administered 2022-12-02 (×2): 50 mg via INTRAVENOUS

## 2022-12-02 MED ORDER — SODIUM CHLORIDE 0.9 % IV SOLN: INTRAVENOUS | Status: DC

## 2022-12-02 MED ORDER — STERILE WATER FOR IRRIGATION IR SOLN
Status: DC | PRN
  Administered 2022-12-02: 100 mL

## 2022-12-02 SURGICAL SUPPLY — 8 items
BLOCK BITE 60FR ADLT L/F GRN (MISCELLANEOUS) ×2 IMPLANT
FORCEPS BIOP RAD 4 LRG CAP 4 (CUTTING FORCEPS) IMPLANT
GOWN CVR UNV OPN BCK APRN NK (MISCELLANEOUS) ×4 IMPLANT
GOWN ISOL THUMB LOOP REG UNIV (MISCELLANEOUS) ×4
KIT PRC NS LF DISP ENDO (KITS) ×2 IMPLANT
KIT PROCEDURE OLYMPUS (KITS) ×4
MANIFOLD NEPTUNE II (INSTRUMENTS) ×2 IMPLANT
WATER STERILE IRR 250ML POUR (IV SOLUTION) ×2 IMPLANT

## 2022-12-02 NOTE — H&P (Signed)
Arlyss Repress, MD [address]  [address]  [place], [place] [address]  Main: [phone]  Fax: [phone] Pager: [phone]  Primary Care Physician:  Resa Miner, MD Primary Gastroenterologist:  Dr. Arlyss Repress  Pre-Procedure History & Physical: HPI:  Emma Fowler is a 37 y.o. female is here for an endoscopy and colonoscopy.   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 Authorizing Provider  buprenorphine-naloxone (SUBOXONE) 8-2 mg SUBL SL tablet Place 1.5 tablets under the tongue daily. 11/01/21   [provider]  Iron-FA-B Cmp-C-Biot-Probiotic (FUSION PLUS) CAPS Take 1 capsule by mouth daily. 11/18/22   Toney Reil, MD  Semaglutide-Weight Management (WEGOVY) 0.25 MG/0.5ML SOAJ Inject 0.25 mg into the skin once a week.    [provider]  albuterol (PROVENTIL HFA;VENTOLIN HFA) 108 (90 Base) MCG/ACT inhaler Inhale 4-6 puffs by mouth every 4 hours as needed for wheezing, cough, and/or shortness of breath 11/04/15 04/09/19  Loleta Rose, MD    Allergies as of 11/17/2022   (No Known Allergies)    Family History  Problem Relation Age of Onset   ADD / ADHD Son      Social History   Socioeconomic History   Marital status: Married    Spouse name: Not on file   Number of children: Not on file   Years of education: Not on file   Highest education level: Not on file  Occupational History   Not on file  Tobacco Use   Smoking status: Every Day    Current packs/day: 0.25    Average packs/day: 0.3 packs/day for 0.7 years (0.2 ttl pk-yrs)    Types: Cigarettes    Start date: 2024    Last attempt to quit: 2013   Smokeless tobacco: Never  Vaping Use   Vaping status: Every Day   Substances: Nicotine, Flavoring  Substance and Sexual Activity   Alcohol use: Yes    Comment: occ   Drug use: Not Currently    Types: Other-see comments    Comment: narcotic abuse-taking Suboxone   Sexual activity: Not on file  Other Topics Concern   Not on file  Social History Narrative   Not on file   Social Determinants of Health   Financial Resource Strain: Not on file  Food Insecurity: Not on file  Transportation Needs: Not on file  Physical Activity: Not on file  Stress: Not on file  Social Connections: Not on file  Intimate Partner Violence: Not on file    Review of Systems: See HPI, otherwise negative ROS  Physical Exam: BP 110/69   Temp 99.7 F (37.6  C) (Temporal)   Resp 12   Ht 5' 4.02" (1.626 m)   Wt 94.7 kg   LMP 09/13/2020 (Exact Date)   SpO2 97%   BMI 35.82 kg/m  General:   Alert,  pleasant and cooperative in NAD Head:  Normocephalic and atraumatic. Neck:  Supple; no masses or thyromegaly. Lungs:  Clear throughout to auscultation.    Heart:  Regular rate and rhythm. Abdomen:  Soft, nontender and nondistended. Normal bowel sounds, without guarding, and without rebound.   Neurologic:  Alert and  oriented x4;  grossly normal neurologically.  Impression/Plan: Emma Fowler is here for an endoscopy and colonoscopy to be performed for IDA  Risks, benefits, limitations, and alternatives regarding  endoscopy and colonoscopy have  been reviewed with the patient.  Questions have been answered.  All parties agreeable.   Lannette Donath, MD  12/02/2022, 10:37 AM

## 2022-12-02 NOTE — Op Note (Signed)
[facility] Gastroenterology Patient Name: Emma Fowler Procedure Date: 12/02/2022 10:37 AM MRN: [id number] Account #: [id number] Date of Birth: 1985/07/05 Admit Type: Outpatient Age: 37 Room: [facility] OR ROOM 01 Gender: Female Note Status: Finalized Instrument Name: [id number] Procedure:             Colonoscopy Indications:           This is the patient's first colonoscopy, Unexplained                         iron deficiency anemia Providers:             Toney Reil MD, MD Medicines:             General Anesthesia Complications:         No immediate complications. Estimated blood loss: None. Procedure:             Pre-Anesthesia Assessment:                        - Prior to the procedure, a History and Physical was                         performed, and patient medications and allergies were                         reviewed. The patient is competent. The risks and                         benefits of the procedure and the sedation options and                         risks were discussed with the patient. All questions                         were answered and informed consent was obtained.                         Patient identification and proposed procedure were                         verified by the physician, the nurse, the                         anesthesiologist, the anesthetist and the technician                         in the pre-procedure area in the procedure room in the                         endoscopy suite. Mental Status Examination: alert and                         oriented. Airway Examination: normal oropharyngeal                         airway and neck mobility. Respiratory Examination:                         clear to auscultation. CV Examination:  normal.                         Prophylactic Antibiotics: The patient does not require                         prophylactic antibiotics. Prior Anticoagulants: The                         patient has  taken no anticoagulant or antiplatelet                         agents. ASA Grade Assessment: II - A patient with mild                         systemic disease. After reviewing the risks and                         benefits, the patient was deemed in satisfactory                         condition to undergo the procedure. The anesthesia                         plan was to use general anesthesia. Immediately prior                         to administration of medications, the patient was                         re-assessed for adequacy to receive sedatives. The                         heart rate, respiratory rate, oxygen saturations,                         blood pressure, adequacy of pulmonary ventilation, and                         response to care were monitored throughout the                         procedure. The physical status of the patient was                         re-assessed after the procedure.                        After obtaining informed consent, the colonoscope was                         passed under direct vision. Throughout the procedure,                         the patient's blood pressure, pulse, and oxygen                         saturations were monitored continuously. The was  introduced through the anus and advanced to the the                         terminal ileum, with identification of the appendiceal                         orifice and IC valve. The colonoscopy was performed                         without difficulty. The patient tolerated the                         procedure well. The quality of the bowel preparation                         was fair. Findings:      The perianal and digital rectal examinations were normal. Pertinent       negatives include normal sphincter tone and no palpable rectal lesions.      The terminal ileum appeared normal.      The entire examined colon appeared normal.      The retroflexed view of the distal  rectum and anal verge was normal and       showed no anal or rectal abnormalities. Impression:            - Preparation of the colon was fair.                        - The examined portion of the ileum was normal.                        - The entire examined colon is normal.                        - The distal rectum and anal verge are normal on                         retroflexion view.                        - No specimens collected. Recommendation:        - Discharge patient to home (with escort).                        - Resume previous diet today.                        - Continue present medications. Procedure Code(s):     --- Professional ---                        [phone], Colonoscopy, flexible; diagnostic, including                         collection of specimen(s) by brushing or washing, when                         performed (separate procedure) Diagnosis Code(s):     --- Professional ---  D50.9, Iron deficiency anemia, unspecified CPT copyright 2022 American Medical Association. All rights reserved. The codes documented in this report are preliminary and upon coder review may  be revised to meet current compliance requirements. Dr. Libby Maw Toney Reil MD, MD 12/02/2022 11:21:59 AM This report has been signed electronically. Number of Addenda: 0 Note Initiated On: 12/02/2022 10:37 AM Scope Withdrawal Time: 0 hours 4 minutes 55 seconds  Total Procedure Duration: 0 hours 7 minutes 46 seconds  Estimated Blood Loss:  Estimated blood loss: none.      [facility]

## 2022-12-02 NOTE — Anesthesia Postprocedure Evaluation (Signed)
Anesthesia Post Note  Patient: Emma Fowler  Procedure(s) Performed: COLONOSCOPY WITH PROPOFOL (Rectum) ESOPHAGOGASTRODUODENOSCOPY (EGD) WITH PROPOFOL (Throat)  Patient location during evaluation: PACU Anesthesia Type: General Level of consciousness: awake and alert Pain management: pain level controlled Vital Signs Assessment: post-procedure vital signs reviewed and stable Respiratory status: spontaneous breathing, nonlabored ventilation, respiratory function stable and patient connected to nasal cannula oxygen Cardiovascular status: blood pressure returned to baseline and stable Postop Assessment: no apparent nausea or vomiting Anesthetic complications: no   No notable events documented.   Last Vitals:  Vitals:   12/02/22 1127 12/02/22 1136  BP: 113/68 101/84  Pulse: 72   Resp: 16   Temp: (!) 36.4 C 36.6 C  SpO2: 95% 95%    Last Pain:  Vitals:   12/02/22 1127  TempSrc:   PainSc: 0-No pain                 Corrado Hymon C Jovanny Stephanie

## 2022-12-02 NOTE — Transfer of Care (Signed)
Immediate Anesthesia Transfer of Care Note  Patient: Emma Fowler  Procedure(s) Performed: COLONOSCOPY WITH PROPOFOL (Rectum) ESOPHAGOGASTRODUODENOSCOPY (EGD) WITH PROPOFOL (Throat)  Patient Location: PACU  Anesthesia Type: General  Level of Consciousness: awake, alert  and patient cooperative  Airway and Oxygen Therapy: Patient Spontanous Breathing and Patient connected to supplemental oxygen  Post-op Assessment: Post-op Vital signs reviewed, Patient's Cardiovascular Status Stable, Respiratory Function Stable, Patent Airway and No signs of Nausea or vomiting  Post-op Vital Signs: Reviewed and stable  Complications: No notable events documented.

## 2022-12-02 NOTE — Op Note (Signed)
[facility] Gastroenterology Patient Name: Emma Fowler Procedure Date: 12/02/2022 10:39 AM MRN: [id number] Account #: [id number] Date of Birth: 04-Aug-1985 Admit Type: Outpatient Age: 36 Room: [facility] OR ROOM 01 Gender: Female Note Status: Finalized Instrument Name: [id number] Procedure:             Upper GI endoscopy Indications:           Unexplained iron deficiency anemia Providers:             Toney Reil MD, MD Medicines:             General Anesthesia Complications:         No immediate complications. Estimated blood loss: None. Procedure:             Pre-Anesthesia Assessment:                        - Prior to the procedure, a History and Physical was                         performed, and patient medications and allergies were                         reviewed. The patient is competent. The risks and                         benefits of the procedure and the sedation options and                         risks were discussed with the patient. All questions                         were answered and informed consent was obtained.                         Patient identification and proposed procedure were                         verified by the physician, the nurse, the                         anesthesiologist, the anesthetist and the technician                         in the pre-procedure area in the procedure room in the                         endoscopy suite. Mental Status Examination: alert and                         oriented. Airway Examination: normal oropharyngeal                         airway and neck mobility. Respiratory Examination:                         clear to auscultation. CV Examination: normal.                         Prophylactic Antibiotics: The  patient does not require                         prophylactic antibiotics. Prior Anticoagulants: The                         patient has taken no anticoagulant or antiplatelet                          agents. ASA Grade Assessment: II - A patient with mild                         systemic disease. After reviewing the risks and                         benefits, the patient was deemed in satisfactory                         condition to undergo the procedure. The anesthesia                         plan was to use general anesthesia. Immediately prior                         to administration of medications, the patient was                         re-assessed for adequacy to receive sedatives. The                         heart rate, respiratory rate, oxygen saturations,                         blood pressure, adequacy of pulmonary ventilation, and                         response to care were monitored throughout the                         procedure. The physical status of the patient was                         re-assessed after the procedure.                        After obtaining informed consent, the endoscope was                         passed under direct vision. Throughout the procedure,                         the patient's blood pressure, pulse, and oxygen                         saturations were monitored continuously. The Endoscope                         was introduced through the mouth, and advanced to the  second part of duodenum. The upper GI endoscopy was                         accomplished without difficulty. The patient tolerated                         the procedure well. Findings:      The duodenal bulb and second portion of the duodenum were normal.       Biopsies were taken with a cold forceps for histology.      The entire examined stomach was normal. Biopsies were taken with a cold       forceps for histology.      A large paraesophageal hernia was found.      The gastroesophageal junction and examined esophagus were normal. Impression:            - Normal duodenal bulb and second portion of the                         duodenum.  Biopsied.                        - Normal stomach. Biopsied.                        - Large paraesophageal hernia.                        - Normal gastroesophageal junction and esophagus. Recommendation:        - Await pathology results.                        - Follow an antireflux regimen indefinitely.                        - Refer to a surgeon at appointment to be scheduled to                         discuss about hernia repair. Procedure Code(s):     --- Professional ---                        [phone], Esophagogastroduodenoscopy, flexible,                         transoral; with biopsy, single or multiple Diagnosis Code(s):     --- Professional ---                        K44.9, Diaphragmatic hernia without obstruction or                         gangrene                        D50.9, Iron deficiency anemia, unspecified CPT copyright 2022 American Medical Association. All rights reserved. The codes documented in this report are preliminary and upon coder review may  be revised to meet current compliance requirements. Dr. Libby Maw Toney Reil MD, MD 12/02/2022 11:10:15 AM This report has been signed electronically. Number of Addenda: 0 Note Initiated On: 12/02/2022 10:39 AM Total Procedure Duration: 0 hours  6 minutes 1 second  Estimated Blood Loss:  Estimated blood loss: none.      [facility]

## 2022-12-03 ENCOUNTER — Encounter: Payer: Self-pay | Admitting: Gastroenterology

## 2022-12-05 LAB — SURGICAL PATHOLOGY

## 2022-12-10 ENCOUNTER — Telehealth: Payer: Self-pay | Admitting: Gastroenterology

## 2022-12-10 DIAGNOSIS — K449 Diaphragmatic hernia without obstruction or gangrene: Secondary | ICD-10-CM

## 2022-12-10 NOTE — Telephone Encounter (Signed)
I have placed referral per the note on the EGD report to [facility].

## 2022-12-10 NOTE — Telephone Encounter (Signed)
The patient called in because the surgeon never called her for her procedure foe the large hernia. Her PCP told her the about the large hernia. PCP told her to call her PCP if she didn't hear from the surgeon in a week call them back. When she called back her PCP the advised her to call us instead.

## 2022-12-10 NOTE — Telephone Encounter (Signed)
Patient verbalized understanding about referral

## 2022-12-10 NOTE — Addendum Note (Signed)
Addended by: Radene Knee L on: 12/10/2022 09:06 AM   Modules accepted: Orders

## 2022-12-11 ENCOUNTER — Encounter: Payer: Self-pay | Admitting: Oncology

## 2022-12-15 NOTE — Progress Notes (Deleted)
Patient ID: Emma Fowler, female   DOB: May 06, 1985, 37 y.o.   MRN: [id number]  Chief Complaint:  ***  History of Present Illness Emma Fowler is a 37 y.o. female with ***.  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      Past Surgical History:  Procedure Laterality Date   BILATERAL SALPINGECTOMY Bilateral 10/22/2020   Procedure: BILATERAL SALPINGECTOMY;  Surgeon: Schermerhorn, Ihor Austin, MD;  Location: [facility];  Service: Gynecology;  Laterality: Bilateral;   COLONOSCOPY WITH PROPOFOL N/A 12/02/2022   Procedure: COLONOSCOPY WITH PROPOFOL;  Surgeon: Toney Reil, MD;  Location: [facility];  Service: Endoscopy;  Laterality: N/A;   DILATION AND CURETTAGE OF UTERUS     ESOPHAGOGASTRODUODENOSCOPY (EGD) WITH PROPOFOL N/A 12/02/2022   Procedure: ESOPHAGOGASTRODUODENOSCOPY (EGD) WITH PROPOFOL;  Surgeon: Toney Reil, MD;  Location: [facility];  Service: Endoscopy;  Laterality: N/A;   TONSILLECTOMY     TUBAL LIGATION     VAGINAL HYSTERECTOMY N/A 10/22/2020   Procedure: HYSTERECTOMY VAGINAL;  Surgeon: Schermerhorn, Ihor Austin, MD;  Location: [facility];  Service: Gynecology;  Laterality: N/A;    No Known Allergies  Current Outpatient Medications  Medication Sig Dispense Refill   buprenorphine-naloxone (SUBOXONE) 8-2 mg SUBL SL tablet Place 1.5 tablets under the tongue daily.     Iron-FA-B Cmp-C-Biot-Probiotic (FUSION PLUS) CAPS Take 1 capsule by mouth daily. 15 capsule 2   Semaglutide-Weight Management (WEGOVY) 0.25 MG/0.5ML SOAJ Inject 0.25 mg into the skin once a week.     No current facility-administered medications for this visit.    Family History Family History  Problem Relation Age of Onset   ADD / ADHD Son        Social History Social History   Tobacco Use   Smoking status: Every Day    Current packs/day: 0.25    Average packs/day: 0.3 packs/day for 0.8 years (0.2 ttl pk-yrs)    Types: Cigarettes    Start date: 2024    Last attempt to quit: 2013   Smokeless tobacco: Never  Vaping Use   Vaping status: Every Day   Substances: Nicotine, Flavoring  Substance Use Topics   Alcohol use: Yes    Comment: occ   Drug use: Not Currently    Types: Other-see comments    Comment: narcotic abuse-taking Suboxone        ROS   Physical Exam Last menstrual period 09/13/2020.   CONSTITUTIONAL: Well developed, and nourished, appropriately responsive and aware without distress. ***  EYES: Sclera non-icteric.   EARS, NOSE, MOUTH AND THROAT: Mask worn.  *** The oropharynx is clear. Oral mucosa is pink and moist.  Dentition: ***   Hearing is intact to voice.  NECK: Trachea is midline, and there is no jugular venous distension.  LYMPH NODES:  Lymph nodes in the neck are not appreciated. RESPIRATORY:  Lungs are clear, and breath sounds are equal bilaterally. *** Normal respiratory effort without pathologic use of accessory muscles. CARDIOVASCULAR: Heart is regular in rate and rhythm.  *** Well perfused.  GI: The abdomen is *** soft, nontender, and nondistended. There were no palpable masses. *** I did not appreciate hepatosplenomegaly. There were normal bowel  sounds.  *** GU: *** MUSCULOSKELETAL:  Symmetrical muscle tone appreciated in all four extremities.    SKIN: Skin turgor is normal. No pathologic skin lesions appreciated.  NEUROLOGIC:  Motor and sensation appear grossly normal.  Cranial nerves are grossly without defect. PSYCH:  Alert and oriented to person, place and time. Affect is appropriate for situation.  Data Reviewed I have personally reviewed what is currently available of the patient's imaging, recent labs and medical records.   Labs:     Latest Ref Rng & Units 11/17/2022    3:10 PM  10/16/2020    1:58 PM 03/13/2020    9:37 AM  CBC  WBC 3.4 - 10.8 x10E3/uL 7.6  7.2  2.9   Hemoglobin 11.1 - 15.9 g/dL 74.2  59.5  63.8   Hematocrit 34.0 - 46.6 % 37.8  33.8  38.6   Platelets 150 - 450 x10E3/uL 309  324  152       Latest Ref Rng & Units 10/16/2020    1:58 PM 03/13/2020    9:37 AM 09/21/2019    9:34 PM  CMP  Glucose 70 - 99 mg/dL 82  756  95   BUN 6 - 20 mg/dL 12  8  14    Creatinine 0.44 - 1.00 mg/dL 4.33  2.95  1.88   Sodium 135 - 145 mmol/L 138  138  139   Potassium 3.5 - 5.1 mmol/L 3.6  4.0  4.3   Chloride 98 - 111 mmol/L 106  99  102   CO2 22 - 32 mmol/L 27  29  26    Calcium 8.9 - 10.3 mg/dL 9.1  9.1  9.2    *** {Labs :18171}  Imaging: Radiological images reviewed:  *** Within last 24 hrs: No results found.  Assessment    *** Patient Active Problem List   Diagnosis Date Noted   Paraesophageal hernia 12/02/2022   Abdominal pain, left lower quadrant 05/11/2019   Chronic constipation 05/11/2019   Iron deficiency anemia 05/04/2019    Plan    ***  * Cannot find OR case * *** Face-to-face time spent with the patient and accompanying care providers(if present) was *** minutes, with more than 50% of the time spent counseling, educating, and coordinating care of the patient.    These notes generated with voice recognition software. I apologize for typographical errors.  Campbell Lerner M.D., FACS 12/15/2022, 9:43 AM

## 2022-12-16 ENCOUNTER — Ambulatory Visit: Payer: Medicaid Other | Admitting: Surgery

## 2022-12-24 ENCOUNTER — Ambulatory Visit: Payer: Medicaid Other | Admitting: Surgery

## 2023-01-05 ENCOUNTER — Ambulatory Visit (INDEPENDENT_AMBULATORY_CARE_PROVIDER_SITE_OTHER): Payer: Medicaid Other | Admitting: Surgery

## 2023-01-05 ENCOUNTER — Encounter: Payer: Self-pay | Admitting: Surgery

## 2023-01-05 VITALS — BP 110/72 | HR 65 | Temp 98.0°F | Ht 64.0 in | Wt 201.0 lb

## 2023-01-05 DIAGNOSIS — K449 Diaphragmatic hernia without obstruction or gangrene: Secondary | ICD-10-CM | POA: Diagnosis not present

## 2023-01-05 NOTE — Progress Notes (Signed)
Patient ID: Emma Fowler, female   DOB: 09-26-1985, 37 y.o.   MRN: [id number]  HPI Emma Fowler is a 37 y.o. female seen in consultation at the request of Dr. Allegra Lai. She does have a history of hiatal hernia.  She Complains of Left upper quadrant abdominal pain after some meals, pain is moderate , intermittent. She denies dysphagia . No cough or reflux She also was seen and evaluated by GI for iron deficiency anemia. 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  She also had a chest x-ray a couple of years ago also showing evidence of a paraesophageal hernia. She has a history of chronic pain and she is taking Suboxone She Did have a prior history of total abdominal hysterectomy with BSO. She is able to perform more than 4 METS of activity without any shortness of breath or chest pain. 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    Past Surgical History:  Procedure Laterality Date   BILATERAL SALPINGECTOMY Bilateral 10/22/2020   Procedure: BILATERAL SALPINGECTOMY;  Surgeon: Schermerhorn, Ihor Austin, MD;  Location: [facility];  Service: Gynecology;  Laterality: Bilateral;   COLONOSCOPY WITH PROPOFOL N/A 12/02/2022   Procedure: COLONOSCOPY WITH PROPOFOL;  Surgeon: Toney Reil, MD;  Location: [facility];  Service: Endoscopy;  Laterality: N/A;   DILATION AND CURETTAGE OF UTERUS     ESOPHAGOGASTRODUODENOSCOPY (EGD) WITH PROPOFOL N/A 12/02/2022   Procedure: ESOPHAGOGASTRODUODENOSCOPY (EGD) WITH PROPOFOL;  Surgeon: Toney Reil, MD;  Location: [facility];  Service: Endoscopy;  Laterality: N/A;   TONSILLECTOMY     TUBAL LIGATION     VAGINAL HYSTERECTOMY N/A  10/22/2020   Procedure: HYSTERECTOMY VAGINAL;  Surgeon: Schermerhorn, Ihor Austin, MD;  Location: [facility];  Service: Gynecology;  Laterality: N/A;    Family History  Problem Relation Age of Onset   ADD / ADHD Son     Social History Social History   Tobacco Use   Smoking status: Every Day    Current packs/day: 0.25    Average packs/day: 0.3 packs/day for 0.8 years (0.2 ttl pk-yrs)    Types: Cigarettes    Start date: 2024    Last attempt to quit: 2013   Smokeless tobacco: Never  Vaping Use   Vaping status: Every Day   Substances: Nicotine, Flavoring  Substance Use Topics   Alcohol use: Yes    Comment: occ   Drug use: Not Currently    Types: Other-see comments    Comment: narcotic abuse-taking Suboxone    No Known Allergies  Current Outpatient Medications  Medication Sig Dispense Refill   buprenorphine-naloxone (SUBOXONE) 8-2 mg SUBL SL tablet Place 1.5 tablets under the tongue daily.     Iron-FA-B Cmp-C-Biot-Probiotic (FUSION PLUS) CAPS Take 1 capsule by mouth daily. 15 capsule 2   Semaglutide-Weight Management (WEGOVY) 0.25 MG/0.5ML SOAJ Inject 0.25 mg into the skin once a week.     No current facility-administered medications for this visit.     Review of Systems Full ROS  was asked and was negative except for the information on the HPI  Physical Exam Last menstrual period 09/13/2020.  CONSTITUTIONAL: NAD. EYES: Pupils are equal, round, and reactive to light, Sclera are non-icteric. EARS, NOSE, MOUTH AND THROAT: The oropharynx is clear. The oral mucosa is pink and moist. Hearing is intact to voice. LYMPH NODES:  Lymph nodes in the neck are normal. RESPIRATORY:  Lungs are clear. There is normal respiratory effort, with equal breath sounds bilaterally, and without pathologic use of accessory muscles. CARDIOVASCULAR: Heart is regular without murmurs, gallops, or rubs. GI: The abdomen is  soft, nontender, and nondistended. There are no palpable masses. There is no  hepatosplenomegaly. There are normal bowel sounds in all quadrants. GU: Rectal deferred.   MUSCULOSKELETAL: Normal muscle strength and tone. No cyanosis or edema.   SKIN: Turgor is good and there are no pathologic skin lesions or ulcers. NEUROLOGIC: Motor and sensation is grossly normal. Cranial nerves are grossly intact. PSYCH:  Oriented to person, place and time. Affect is normal.  Data Reviewed  I have personally reviewed the patient's imaging, laboratory findings and medical records.    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  Discussed with the patient in detail about her disease process.  She will require further workup.  We will obtain a CT scan of the abdomen and pelvis to evaluate intra-abdominal mediastinal anatomy.  She will also require a barium swallow.  Once the workup is completed I will be happy to see her to plan her surgical intervention.  We did talk about the role for robotic paraesophageal hernia repair.  The risks, the benefits and the possible complications including but not limited to: Bleeding, infection bowel and esophageal injuries.  Also we discussed about diet modifications.  She seems interested. I spent 60 minutes in this encounter including extensive review of medical records, personally reviewing imaging studies, coordinating her care, placing orders, counseling the patient and performing appropriate documentation  Sterling Big, MD FACS General Surgeon 01/05/2023, 9:00 AM

## 2023-01-05 NOTE — Patient Instructions (Signed)
We will get you scheduled for a CT of the abdomen and pelvis and a Barium swallow, we will call you about these appointments.  We will have you follow up here after we get the results of these studies.

## 2023-01-06 ENCOUNTER — Telehealth: Payer: Self-pay

## 2023-01-06 NOTE — Telephone Encounter (Signed)
Spoke with the patient and let her know her CT scan and barium swallow exam have been scheduled.  The patient is scheduled for this at [facility] on 01/16/23. She will need to [facility] at [facility] entrance at 8:30 am and she will need to have nothing to eat or drink for 3 hours before the exam. The patient is aware of date, time, and instructions.

## 2023-01-16 ENCOUNTER — Ambulatory Visit
Admission: RE | Admit: 2023-01-16 | Discharge: 2023-01-16 | Disposition: A | Discharge status: Home / Self Care | Payer: Medicaid Other | Source: Ambulatory Visit | Attending: Surgery

## 2023-01-16 DIAGNOSIS — K449 Diaphragmatic hernia without obstruction or gangrene: Secondary | ICD-10-CM

## 2023-01-16 MED ORDER — IOHEXOL 300 MG/ML  SOLN
100.0000 mL | Freq: Once | INTRAMUSCULAR | Status: AC | PRN
  Administered 2023-01-16: 100 mL via INTRAVENOUS

## 2023-01-19 ENCOUNTER — Telehealth: Payer: Self-pay

## 2023-01-19 NOTE — Telephone Encounter (Signed)
-----   Message from [facility] sent at 01/19/2023  9:33 AM EST ----- Please let her know study shows a known large hiatal hernia ----- Message ----- From: Interface, Rad Results In Sent: 01/16/2023  12:16 PM EST To: Leafy Ro, MD

## 2023-01-19 NOTE — Telephone Encounter (Signed)
Notified patient as instructed, patient pleased. Discussed follow-up appointments, patient agrees  

## 2023-02-04 ENCOUNTER — Encounter: Payer: Self-pay | Admitting: Surgery

## 2023-02-04 ENCOUNTER — Ambulatory Visit: Payer: Medicaid Other | Admitting: Surgery

## 2023-02-04 VITALS — BP 122/83 | HR 76 | Temp 98.2°F | Ht 64.0 in | Wt 192.6 lb

## 2023-02-04 DIAGNOSIS — K449 Diaphragmatic hernia without obstruction or gangrene: Secondary | ICD-10-CM | POA: Diagnosis not present

## 2023-02-04 NOTE — Progress Notes (Signed)
Outpatient Surgical Follow Up  02/04/2023  Emma Fowler is an 37 y.o. female.   Chief Complaint  Patient presents with   Routine Post Op    paraesophageal hernia    HPI:  Emma Fowler is a 37 y.o. female seen in consultation at the request of Dr. Allegra Lai. She does have a history of hiatal hernia.  She Complains of Left upper quadrant abdominal pain after some meals, pain is moderate , intermittent. She denies dysphagia . No cough or reflux She also was seen and evaluated by GI for iron deficiency anemia. 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  She also had a chest x-ray a couple of years ago also showing evidence of a paraesophageal hernia. She has a history of chronic pain and she is taking Suboxone She Did have a prior history of total abdominal hysterectomy with BSO. She is able to perform more than 4 METS of activity without any shortness of breath or chest pain. Completed her CT as well as a barium swallow and have personally reviewed them.  Showing evidence of type III to large paraesophageal hernia w at least 2/3 of stomach within the mediastinum.  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    Past Surgical History:  Procedure Laterality Date   BILATERAL SALPINGECTOMY Bilateral 10/22/2020   Procedure: BILATERAL SALPINGECTOMY;  Surgeon: Schermerhorn, Ihor Austin, MD;  Location: [facility];  Service: Gynecology;  Laterality: Bilateral;   COLONOSCOPY WITH PROPOFOL N/A 12/02/2022   Procedure: COLONOSCOPY WITH PROPOFOL;  Surgeon: Toney Reil, MD;  Location: [facility];  Service: Endoscopy;  Laterality: N/A;   DILATION AND CURETTAGE OF UTERUS     ESOPHAGOGASTRODUODENOSCOPY (EGD) WITH PROPOFOL N/A  12/02/2022   Procedure: ESOPHAGOGASTRODUODENOSCOPY (EGD) WITH PROPOFOL;  Surgeon: Toney Reil, MD;  Location: [facility];  Service: Endoscopy;  Laterality: N/A;   TONSILLECTOMY     TUBAL LIGATION     VAGINAL HYSTERECTOMY N/A 10/22/2020   Procedure: HYSTERECTOMY VAGINAL;  Surgeon: Schermerhorn, Ihor Austin, MD;  Location: [facility];  Service: Gynecology;  Laterality: N/A;    Family History  Problem Relation Age of Onset   ADD / ADHD Son     Social History:  reports that she has quit smoking. Her smoking use included cigarettes. She has never used smokeless tobacco. She reports current alcohol use. She reports that she does not currently use drugs after having used the following drugs: Other-see comments.  Allergies: No Known Allergies  Medications reviewed.    ROS Full ROS performed and is otherwise negative other than what is stated in HPI   Ht 5\' 4"  (1.626 m)   Wt 192 lb 9.6 oz (87.4 kg)   LMP 09/13/2020 (Exact Date)   BMI 33.06 kg/m   Physical Exam  CONSTITUTIONAL: NAD. EYES: Pupils are equal, round, and reactive to light, Sclera are non-icteric. EARS, NOSE, MOUTH AND THROAT: The oropharynx is clear. The oral mucosa is pink and moist. Hearing is intact to voice. LYMPH NODES:  Lymph nodes in the neck are normal. RESPIRATORY:  Lungs are clear. There is normal respiratory effort, with equal breath sounds bilaterally, and without pathologic use of  accessory muscles. CARDIOVASCULAR: Heart is regular without murmurs, gallops, or rubs. GI: The abdomen is  soft, nontender, and nondistended. There are no palpable masses. There is no hepatosplenomegaly. There are normal bowel sounds in all quadrants. GU: Rectal deferred.   MUSCULOSKELETAL: Normal muscle strength and tone. No cyanosis or edema.   SKIN: Turgor is good and there are no pathologic skin lesions or ulcers. NEUROLOGIC: Motor and sensation is grossly normal. Cranial nerves are grossly intact. PSYCH:  Oriented  to person, place and time. Affect is normal.    Assessment/Plan: 37 yo female with large paraesophageal hernia  type III.  Discussed with the patient in detail about her disease process.   I Do think that the hernia is amenable for robotic repair.  We did talk about the role for robotic paraesophageal hernia repair.  The risks, the benefits and the possible complications including but not limited to: Bleeding, infection bowel and esophageal injuries.  Also we discussed about diet modifications.  She seems interested. Will plan for robotic paraesophageal hernia repair with fundoplication in January, we w ill see her back in 1 month to discuss dates She wishes to wait until after the holidays  I spent 30 minutes in this encounter including extensive review of medical records, personally reviewing imaging studies, coordinating her care, placing orders, counseling the patient and performing appropriate documentation    Sterling Big, MD [facility] General Surgeon

## 2023-02-04 NOTE — Patient Instructions (Signed)
We have spoken today about repairing your Hiatal Hernia. Your surgery will be scheduled at [facility] with Dr. Everlene Farrier.  Plan to be in the hospital for 1-2 days if the minimally invasive surgery is completed without having to make a bigger incision. If the bigger incision is made, you will most likely need to be in the hospital 4-6 days. You will be on a soft diet and need to recover for 2 weeks following your surgery prior to doing any of your normal activities. At the 2 week mark, we will see you in the office and if you are doing ok we will advance your diet and activity level as you tolerate.  Please see your Blue (Pre-care) Sheet for more information regarding your surgery. Our surgery scheduler will call you to verify surgery date and to go over information  You will need to arrange to be out of work for approximately 1-2 weeks and then you may return with a lifting restriction for 4 more weeks. If you have FMLA or Disability paperwork that needs to be filled out, please have your company fax your paperwork to [phone] or you may drop this by either office. This paperwork will be filled out within 3 days after your surgery has been completed.  Please call our office with any questions or concerns that you have regarding your surgery and recovery.    Laparoscopic Nissen Fundoplication Laparoscopic Nissen fundoplication is surgery to relieve heartburn and other problems caused by gastric fluids flowing up into your esophagus. The esophagus is the tube that carries food and liquid from your throat to your stomach. Normally, the muscle that sits between your stomach and your esophagus (lower esophageal sphincter or LES) keeps stomach fluids in your stomach. In some people, the LES does not work properly, and stomach fluids flow up into the esophagus. This can happen when part of the stomach bulges through the LES (hiatal hernia). The backward flow of stomach fluids can cause a type of severe and  long-standing heartburn that is called gastroesophageal reflux disease (GERD). You may need this surgery if other treatments for GERD have not helped. In a laparoscopic Nissen fundoplication, the upper part of your stomach is wrapped around the lower part of your esophagus to strengthen the LES and prevent reflux. If you have a hiatal hernia, it will also be repaired with this surgery. The procedure is done through several small incisions in your abdomen. It is performed using a thin, telescopic instrument (laparoscope) and other instruments that can pass through the scope or through other small incisions. Tell a health care provider about: Any allergies you have. All medicines you are taking, including vitamins, herbs, eye drops, creams, and over-the-counter medicines. Any problems you or family members have had with anesthetic medicines. Any blood disorders you have. Any surgeries you have had. Any medical conditions you have. What are the risks? Generally, this is a safe procedure. However, problems may occur, including: Difficulty swallowing (dysphagia). Bloating. Nausea or vomiting. Damage to the lung, causing a collapsed lung. Infection or bleeding. What happens before the procedure? Ask your health care provider about: Changing or stopping your regular medicines. This is especially important if you are taking diabetes medicines or blood thinners. Taking medicines such as aspirin and ibuprofen. These medicines can thin your blood. Do not take these medicines before your procedure if your health care provider asks you not to. Follow your health care provider's instructions about eating or drinking restrictions. Plan to have someone take  you home after the procedure. What happens during the procedure? An IV tube will be inserted into one of your veins. It will be used to give you fluids and medicines during the procedure. You will be given a medicine that makes you fall asleep (general  anesthetic). Your abdomen will be cleaned with a germ-killing solution (antiseptic). The surgeon will make a small incision in your abdomen and insert a tube through the incision. Your abdomen will be filled with a gas. This helps the surgeon to see your organs more easily and it makes more space to work. The surgeon will insert the laparoscope through the incision. The scope has a camera that will send pictures to a monitor in the operating room. The surgeon will make several other small incisions in your abdomen to insert the other instruments that are needed during the procedure. Another instrument (dilator) will be passed through your mouth and down your esophagus into the upper part of your stomach. The dilator will prevent your LES from being closed too tightly during surgery. The surgeon will pass the top portion of your stomach behind the lower part of your esophagus and wrap it all the way around. This will be stitched into place. If you have a hiatal hernia, it will be repaired during this procedure. All instruments will be removed, and your incisions will be closed under your skin with stitches (sutures). Skin adhesive strips may also be used. A bandage (dressing) will be placed on your skin over the incisions. The procedure may vary among health care providers and hospitals. What happens after the procedure? You will be moved to a recovery area. Your blood pressure, heart rate, breathing rate, and blood oxygen level will be monitored often until the medicines you were given have worn off. You will be given pain medicine as needed. Your IV tube will be kept in until you are able to drink fluids. This information is not intended to replace advice given to you by your health care provider. Make sure you discuss any questions you have with your health care provider. Document Released: 03/17/2014 Document Revised: 08/02/2015 Document Reviewed: 10/26/2013 Elsevier Interactive Patient  Education  2017 [employer]   Laparoscopic Nissen Fundoplication, Care After Refer to this sheet in the next few weeks. These instructions provide you with information about caring for yourself after your procedure. Your health care provider may also give you more specific instructions. Your treatment has been planned according to current medical practices, but problems sometimes occur. Call your health care provider if you have any problems or questions after your procedure. What can I expect after the procedure? After the procedure, it is common to have: Difficulty swallowing (dysphagia). Excess gas (bloating). Follow these instructions at home: Medicines  Take medicines only as directed by your health care provider. Do not drive or operate heavy machinery while taking pain medicine. Incision care  There are many different ways to close and cover an incision, including stitches (sutures), skin glue, and adhesive strips. Follow your health care provider's instructions about: Incision care. Bandage (dressing) changes and removal. Incision closure removal. Check your incision areas every day for signs of infection. Watch for: Redness, swelling, or pain. Fluid, blood, or pus. Do not take baths, swim, or use a hot tub until your health care provider approves. Take showers as directed by your health care provider. Eating and drinking  Follow your health care provider's instructions about eating. You may need to follow a very soft diet for 2  weeks, followed by a diet of more regular foods for 2 weeks, no breads. You should return to your usual diet gradually. Drink enough fluid to keep your urine clear or pale yellow. Activity  Return to your normal activities as directed by your health care provider. Ask your health care provider what activities are safe for you. Avoid strenuous exercise. Do not lift anything that is heavier than 10 lb (4.5 kg). Ask your health care provider when you  can: Return to sexual activity. Drive. Go back to work. Contact a health care provider if: You have a fever. Your pain gets worse or is not helped by medicine. You have frequent nausea or vomiting. You have continued abdominal bloating. You have an ongoing (persistent) cough. You have redness, swelling, or pain in any incision areas. You have fluid, blood, or pus coming from any incisions. Get help right away if: You have trouble breathing. You are unable to swallow. You have persistent vomiting. You have blood in your vomit. You have severe abdominal pain. This information is not intended to replace advice given to you by your health care provider. Make sure you discuss any questions you have with your health care provider. Document Released: 10/18/2003 Document Revised: 08/02/2015 Document Reviewed: 10/26/2013 Elsevier Interactive Patient Education  2017 Elsevier Inc.   Diet After Nissen Fundoplication Surgery This diet information is for patients who have recently had Nissen fundoplication surgery to correct reflux disease or to repair various types of hernias, such as hiatal hernia and intrathoracic stomach. This diet may also be used for other gastrointestinal surgeries, such as Heller myotomy and repair of achalasia. The diet will help control diarrhea, excess gas and swallowing problems, which may occur after this type of surgery. Keeping Your Stomach from Stretching Eat small, frequent meals (six to eight per day). This will help you consume the majority of the nutrients you need without causing your stomach to feel full or distended.  Drinking large amounts of fluids with meals can stretch your stomach. You may drink fluids between meals as often as you like, but limit fluids to 1/2 cup (4 fluid ounces) with meals and one cup (8 fluid ounces) with snacks.  Sit upright while eating and stay upright for 30 minutes after each meal. Gravity can help food move through your digestive  tract. Do not lie down after eating. Sit upright for 2 hours after your last meal or snack of the day.  Eat very slowly. Take your time when eating.  Take small bites and chew your food well to help aid in swallowing and digestion.  Avoid crusty breads and sticky, gummy foods, such as bananas, fresh doughy breads, rolls and doughnuts. These types of foods become sticky and difficult to swallow.  Toasted breads tend to be better tolerated.  Lastly, if you eat sweets, consume them at the end of your meal to avoid a group of symptoms referred to as "dumping syndrome". This describes the rapid emptying of foods from the stomach to the small intestine. Sweetened beverages, candy and desserts move more rapidly and dump quickly into the intestines. This can cause symptoms of nausea, weakness, cold sweats, cramps, diarrhea and dizzy spells.  Avoiding Gas Avoid drinking through a straw. Do not chew gum or tobacco. These actions cause you to swallow air, which produces excess gas in your stomach. Chew with your mouth closed.  Avoid any foods that cause stomach gas and distention. These foods include corn, dried beans, peas, lentils, onions, broccoli, cauliflower  and any food from the cabbage family.  Avoid carbonated drinks, alcohol, citrus and tomato products.  When will I be able to eat a soft diet? After Nissen fundoplication surgery, your diet will be advanced slowly by your surgeon. Generally, you will be on a clear liquid diet for the first few meals. Then you will advance to the full liquid diet for a meal or two and eventually to a Nissen soft diet. Please be aware that each patient's tolerance to food is different. Your doctor will advance your diet depending on how well you progress after surgery. Clear Liquid Diet  The first diet after surgery is the clear liquid diet. It includes the following liquids: Apple juice  Cranberry juice  Grape juice  Chicken broth  Beef broth  Flavored gelatin  (Jell-O)  Decaf tea and coffee  Caffeinated beverages are permitted based on tolerance  Popsicles  [place] ice Carbonated drinks (sodas) are not allowed for the first six to eight weeks after surgery. After this time you can try them again in small amounts.  Full Liquid Diet The full liquid diet contains anything on the clear liquid diet, plus: Milk, soy, rice and almond (no chocolate)  Cream of wheat, cream of rice, grits  Strained creamed soups (no tomato or broccoli)  Vanilla and strawberry-flavored ice cream  Sherbet  Blended, custard styled or whipped yogurt (plain or vanilla only)  Vanilla and butterscotch pudding (no chocolate or coconut)  Nutritional drinks including Ensure, Boost, Carnation Instant Breakfast (no chocolate-flavored) Note: Dairy products, such as milk, ice cream and pudding, may cause diarrhea in some people just after surgery. You may need to avoid milk products. If so, substitute them with lactose-free beverages, such as soy, rice, Lactaid or almond milks.  Nissen Soft Diet Food Category Foods to Choose Foods to Avoid  Beverages Milk, such as, whole, 2%, 1%, non-fat, or skim, soy, rice, almond  Caffeinated and decaf tea and coffee  Powdered drink mixes (in moderation)  Non-citrus juices (apple, grape, cranberry or blends of these)  Fruit nectars  Nutritional drinks including Boost, Ensure, Carnation Instant Breakfast Chocolate milk, cocoa or other chocolate-flavored drinks  Carbonated drinks  Alcohol  Citrus juices like orange, grapefruit, lemon and lime  Breads Crackers (saltine, butter, soda, graham, Goldfish and Cheese Nips)   Untoasted bread, bagels, [place] and hard rolls, English muffins  Crackers with nuts, seeds, fresh or dried fruit, coconut, or highly seasoned, such as garlic or onion-flavored  Sweet rolls, coffee cake or doughnuts  Cereals Well cooked cereals, such as oatmeal (plain or flavored)  Cold cereal (Cornflakes, Rice  Krispies, Cheerios, Special K plain, Rice Chex and puffed rice) Very coarse cereal, such as bran, shredded wheat  Any cereal with fresh or dried fruit, coconut, seeds or nuts  Desserts Eat in moderation and do not eat desserts or sweets by themselves. Plain cakes, cookies and cream-filled pies  Vanilla and butterscotch pudding or custard  Ice cream, ice milk, frozen yogurt and sherbet  Gelatin made from allowed foods  Fruit ices and popsicles Desserts containing chocolate, coconut, nuts, seeds, fresh or dried fruit, peppermint or spearmint  Eggs  Poached, hard boiled or scrambled Fried eggs and highly seasoned eggs (deviled eggs)  Fats Eat in moderation. Butter and margarine  Mayonnaise and vegetable oils  Mildly seasoned cream sauces and gravies  Plain cream cheese  Sour cream Highly seasoned salad dressings, cream sauces and gravies  Bacon, bacon fat, ham fat, lard and salt pork  Foy [place]  foods  Nuts  Fruits Fruit juice  Any canned or cooked fruit except those listed in the AVOID column ALL fresh fruits, such as citrus, apples, and pineapple  Canned pineapple  Dried fruits, such as raisins, berries  Fruits with seeds, such as berries, kiwi and figs  Meat, Fish, Poultry, and [employer] may be ground, minced or chopped to ease swallowing and digestion  Tender, well cooked and moist cuts of beef, chicken, [place] and pork  Veal and lamb  Flaky, cooked fish  Canned tuna  Cottage and ricotta cheeses  Mild cheese, such as American, brick, mozzarella and baby Swiss  Creamy peanut butter  Plain custard or blended fruit yogurt  Moist casseroles, such as macaroni & cheese, tuna noodle  Grilled or toasted cheese sandwich Tough meats with a lot of gristle  Fried, highly seasoned, smoked and fatty meat, fish or poultry, such as frankfurters, luncheon meats, sausage, bacon, spare ribs, beef brisket, sardines, anchovies, duck and goose  Chili and other entrees made with pepper or  chili pepper  Shellfish  Strongly flavored cheeses, such as sharp cheese, extra sharp cheddar, cheese containing peppers or other seasonings  Crunchy peanut butter  Any yogurt with nuts, seeds, coconut, strawberries or raspberries  Potatoes and Starches Peeled, mashed or boiled white or sweet potatoes  Oven-baked potatoes without skin  Well cooked white rice, enriched noodles, barley, spaghetti, macaroni and other pastas Fried potatoes, potato skins and potato chips  Hard and soft taco shells  Fried, brown or wild rice  Soups Mildly flavored meat stocks  Cream soups made from allowed foods Highly seasoned soups and tomato based soups, cream soups made with gas producing vegetables, such as broccoli, cauliflower, onion, etc.  Sweets and Snacks Use in moderation and do not eat large amounts of sweets by themselves. Syrup, honey, jelly and seedless jam  Plain hard candies and plain candies made with allowed ingredients  Molasses  Marshmallows  Other candy made from allowed ingredients  Thin pretzels Jam, marmalade and preserves  Chocolate in any form  Any candy containing nuts, coconut, seeds, peppermint, spearmint or dried or fresh fruit  Popcorn, potato chips, tortilla chips  Soft or hard thick pretzels, such as sourdough  Vegetables Well cooked soft vegetables without seeds or skins, such as asparagus tips, beets, carrots, green and wax beans, chopped spinach, tender canned baby peas, squash and pumpkin Raw vegetables, tomatoes, tomato juice, tomato sauce and V-8 juice(tomato based products can irritate the stomach)  Gas producing vegetables, such as broccoli, Brussel sprouts, cabbage, cauliflower, onions, corn, cucumber, green peppers, rutabagas, turnips, radishes and sauerkraut  Dried beans, peas and lentils  Miscellaneous Salt and spices in moderation  Mustard and vinegar in moderation Fried or highly seasoned foods  Coconut and seeds  Pickles and olives  Chili sauces, ketchup,  barbecue sauce, horseradish, black pepper, chili powder and onion and garlic seasonings  Any other strongly flavored seasoning, condiment, spice or herb not tolerated  Any food not tolerated

## 2023-02-16 ENCOUNTER — Ambulatory Visit: Payer: Medicaid Other | Admitting: Physician Assistant

## 2023-02-16 ENCOUNTER — Other Ambulatory Visit: Payer: Self-pay

## 2023-02-16 DIAGNOSIS — N921 Excessive and frequent menstruation with irregular cycle: Secondary | ICD-10-CM | POA: Insufficient documentation

## 2023-02-16 HISTORY — DX: Excessive and frequent menstruation with irregular cycle: N92.1

## 2023-02-16 NOTE — Progress Notes (Unsigned)
Celso Amy, PA-C [address]  [address]  [place], [place] [address]  Main: [phone]  Fax: [phone]   Primary Care Physician: Resa Miner, MD  Primary Gastroenterologist:  Celso Amy, PA-C / Dr. Lannette Donath    CC: Follow-up iron deficiency anemia, large paraesophageal hiatal hernia  HPI: Emma Fowler is a 37 y.o. female, established patient Dr. Allegra Lai, returns for 65-month follow-up of iron deficiency anemia.  Also has a history of chronic LLQ pain and constipation.  Celiac labs negative.  History of iron deficiency anemia for many years.  History of menorrhagia.  Had total abdominal hysterectomy with BSO 10/2020.  Constipation was treated with MiraLAX and Linzess 290.  EGD 12/02/2022: Large paraesophageal hernia, otherwise normal stomach and duodenum.  Normal esophagus.  She was referred to general surgeon to discuss hiatal hernia repair.  Biopsies negative for celiac and H. pylori.  Colonoscopy 12/02/2022: Fair prep, otherwise normal with no polyps.  Last labs 11/17/2022 showed: Hemoglobin 11.7, low iron 19, iron saturation 4%, ferritin 7.  Normal vitamin B12 and folate.  Treated with fusion plus iron every other day.  Current Outpatient Medications  Medication Sig Dispense Refill   buprenorphine-naloxone (SUBOXONE) 8-2 mg SUBL SL tablet Place 1.5 tablets under the tongue daily.     Iron-FA-B Cmp-C-Biot-Probiotic (FUSION PLUS) CAPS Take 1 capsule by mouth daily. 15 capsule 2   omeprazole (PRILOSEC) 20 MG capsule Take 20 mg by mouth daily.     Semaglutide-Weight Management (WEGOVY) 0.25 MG/0.5ML SOAJ Inject 0.25 mg into the skin once a week.     No current facility-administered medications for this visit.    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    Past Surgical History:  Procedure Laterality Date   BILATERAL SALPINGECTOMY Bilateral 10/22/2020   Procedure: BILATERAL SALPINGECTOMY;  Surgeon: Schermerhorn, Ihor Austin, MD;  Location: [facility];  Service: Gynecology;  Laterality: Bilateral;   COLONOSCOPY WITH PROPOFOL N/A 12/02/2022   Procedure: COLONOSCOPY WITH PROPOFOL;  Surgeon: Toney Reil, MD;  Location: [facility];  Service: Endoscopy;  Laterality: N/A;   DILATION AND CURETTAGE OF UTERUS     ESOPHAGOGASTRODUODENOSCOPY (EGD) WITH PROPOFOL N/A 12/02/2022   Procedure: ESOPHAGOGASTRODUODENOSCOPY (EGD) WITH PROPOFOL;  Surgeon: Toney Reil, MD;  Location: [facility];  Service: Endoscopy;  Laterality: N/A;   TONSILLECTOMY     TUBAL LIGATION     VAGINAL HYSTERECTOMY N/A 10/22/2020   Procedure: HYSTERECTOMY VAGINAL;  Surgeon: Schermerhorn, Ihor Austin, MD;  Location: [facility];  Service: Gynecology;  Laterality: N/A;    Review of Systems:    All systems reviewed and negative except where noted in HPI.   Physical Examination:   LMP 09/13/2020 (Exact Date)   General: Well-nourished, well-developed in no acute distress.  Lungs: Clear to auscultation bilaterally. Non-labored. Heart: Regular rate and rhythm, no murmurs rubs or gallops.  Abdomen: Bowel sounds are normal; Abdomen is Soft; No hepatosplenomegaly, masses or hernias;  No Abdominal Tenderness; No guarding or rebound tenderness. Neuro: Alert and oriented x 3.  Grossly intact.  Psych: Alert and cooperative,  normal mood and affect.   Imaging Studies: No results found.  Assessment and Plan:   Emma Fowler is a 37 y.o. y/o female   1.  Iron deficiency anemia, most likely due to large paraesophageal  Repeat labs  3.  Paraesophageal hiatal hernia  Refer to general surgeon to discuss repair  3.  Chronic  constipation  Continue Linzess 290 and MiraLAX   Celso Amy, PA-C  Follow up ***  BP check ***

## 2023-03-02 ENCOUNTER — Ambulatory Visit (INDEPENDENT_AMBULATORY_CARE_PROVIDER_SITE_OTHER): Payer: Medicaid Other | Admitting: Surgery

## 2023-03-02 ENCOUNTER — Encounter: Payer: Self-pay | Admitting: Surgery

## 2023-03-02 VITALS — BP 112/73 | HR 80 | Temp 98.5°F | Ht 64.0 in | Wt 190.2 lb

## 2023-03-02 DIAGNOSIS — K449 Diaphragmatic hernia without obstruction or gangrene: Secondary | ICD-10-CM

## 2023-03-02 NOTE — Patient Instructions (Signed)
 We have spoken today about repairing your Hiatal Hernia. Your surgery will be scheduled at [facility] with Dr. Everlene Farrier.  Plan to be in the hospital for 1-2 days if the minimally invasive surgery is completed without having to make a bigger incision. If the bigger incision is made, you will most likely need to be in the hospital 4-6 days. You will be on a soft diet and need to recover for 2 weeks following your surgery prior to doing any of your normal activities. At the 2 week mark, we will see you in the office and if you are doing ok we will advance your diet and activity level as you tolerate.  Please see your Blue (Pre-care) Sheet for more information regarding your surgery. Our surgery scheduler will call you to verify surgery date and to go over information  You will need to arrange to be out of work for approximately 1-2 weeks and then you may return with a lifting restriction for 4 more weeks. If you have FMLA or Disability paperwork that needs to be filled out, please have your company fax your paperwork to [phone] or you may drop this by either office. This paperwork will be filled out within 3 days after your surgery has been completed.  Please call our office with any questions or concerns that you have regarding your surgery and recovery.    Laparoscopic Nissen Fundoplication Laparoscopic Nissen fundoplication is surgery to relieve heartburn and other problems caused by gastric fluids flowing up into your esophagus. The esophagus is the tube that carries food and liquid from your throat to your stomach. Normally, the muscle that sits between your stomach and your esophagus (lower esophageal sphincter or LES) keeps stomach fluids in your stomach. In some people, the LES does not work properly, and stomach fluids flow up into the esophagus. This can happen when part of the stomach bulges through the LES (hiatal hernia). The backward flow of stomach fluids can cause a type of severe and  long-standing heartburn that is called gastroesophageal reflux disease (GERD). You may need this surgery if other treatments for GERD have not helped. In a laparoscopic Nissen fundoplication, the upper part of your stomach is wrapped around the lower part of your esophagus to strengthen the LES and prevent reflux. If you have a hiatal hernia, it will also be repaired with this surgery. The procedure is done through several small incisions in your abdomen. It is performed using a thin, telescopic instrument (laparoscope) and other instruments that can pass through the scope or through other small incisions. Tell a health care provider about: Any allergies you have. All medicines you are taking, including vitamins, herbs, eye drops, creams, and over-the-counter medicines. Any problems you or family members have had with anesthetic medicines. Any blood disorders you have. Any surgeries you have had. Any medical conditions you have. What are the risks? Generally, this is a safe procedure. However, problems may occur, including: Difficulty swallowing (dysphagia). Bloating. Nausea or vomiting. Damage to the lung, causing a collapsed lung. Infection or bleeding. What happens before the procedure? Ask your health care provider about: Changing or stopping your regular medicines. This is especially important if you are taking diabetes medicines or blood thinners. Taking medicines such as aspirin and ibuprofen. These medicines can thin your blood. Do not take these medicines before your procedure if your health care provider asks you not to. Follow your health care provider's instructions about eating or drinking restrictions. Plan to have someone take  you home after the procedure. What happens during the procedure? An IV tube will be inserted into one of your veins. It will be used to give you fluids and medicines during the procedure. You will be given a medicine that makes you fall asleep (general  anesthetic). Your abdomen will be cleaned with a germ-killing solution (antiseptic). The surgeon will make a small incision in your abdomen and insert a tube through the incision. Your abdomen will be filled with a gas. This helps the surgeon to see your organs more easily and it makes more space to work. The surgeon will insert the laparoscope through the incision. The scope has a camera that will send pictures to a monitor in the operating room. The surgeon will make several other small incisions in your abdomen to insert the other instruments that are needed during the procedure. Another instrument (dilator) will be passed through your mouth and down your esophagus into the upper part of your stomach. The dilator will prevent your LES from being closed too tightly during surgery. The surgeon will pass the top portion of your stomach behind the lower part of your esophagus and wrap it all the way around. This will be stitched into place. If you have a hiatal hernia, it will be repaired during this procedure. All instruments will be removed, and your incisions will be closed under your skin with stitches (sutures). Skin adhesive strips may also be used. A bandage (dressing) will be placed on your skin over the incisions. The procedure may vary among health care providers and hospitals. What happens after the procedure? You will be moved to a recovery area. Your blood pressure, heart rate, breathing rate, and blood oxygen level will be monitored often until the medicines you were given have worn off. You will be given pain medicine as needed. Your IV tube will be kept in until you are able to drink fluids. This information is not intended to replace advice given to you by your health care provider. Make sure you discuss any questions you have with your health care provider. Document Released: 03/17/2014 Document Revised: 08/02/2015 Document Reviewed: 10/26/2013 Elsevier Interactive Patient  Education  2017 [employer].   Laparoscopic Nissen Fundoplication, Care After Refer to this sheet in the next few weeks. These instructions provide you with information about caring for yourself after your procedure. Your health care provider may also give you more specific instructions. Your treatment has been planned according to current medical practices, but problems sometimes occur. Call your health care provider if you have any problems or questions after your procedure. What can I expect after the procedure? After the procedure, it is common to have: Difficulty swallowing (dysphagia). Excess gas (bloating). Follow these instructions at home: Medicines  Take medicines only as directed by your health care provider. Do not drive or operate heavy machinery while taking pain medicine. Incision care  There are many different ways to close and cover an incision, including stitches (sutures), skin glue, and adhesive strips. Follow your health care provider's instructions about: Incision care. Bandage (dressing) changes and removal. Incision closure removal. Check your incision areas every day for signs of infection. Watch for: Redness, swelling, or pain. Fluid, blood, or pus. Do not take baths, swim, or use a hot tub until your health care provider approves. Take showers as directed by your health care provider. Eating and drinking  Follow your health care provider's instructions about eating. You may need to follow a very soft diet for 2  weeks, followed by a diet of more regular foods for 2 weeks, no breads. You should return to your usual diet gradually. Drink enough fluid to keep your urine clear or pale yellow. Activity  Return to your normal activities as directed by your health care provider. Ask your health care provider what activities are safe for you. Avoid strenuous exercise. Do not lift anything that is heavier than 10 lb (4.5 kg). Ask your health care provider when you  can: Return to sexual activity. Drive. Go back to work. Contact a health care provider if: You have a fever. Your pain gets worse or is not helped by medicine. You have frequent nausea or vomiting. You have continued abdominal bloating. You have an ongoing (persistent) cough. You have redness, swelling, or pain in any incision areas. You have fluid, blood, or pus coming from any incisions. Get help right away if: You have trouble breathing. You are unable to swallow. You have persistent vomiting. You have blood in your vomit. You have severe abdominal pain. This information is not intended to replace advice given to you by your health care provider. Make sure you discuss any questions you have with your health care provider. Document Released: 10/18/2003 Document Revised: 08/02/2015 Document Reviewed: 10/26/2013 Elsevier Interactive Patient Education  2017 Elsevier Inc.   Diet After Nissen Fundoplication Surgery This diet information is for patients who have recently had Nissen fundoplication surgery to correct reflux disease or to repair various types of hernias, such as hiatal hernia and intrathoracic stomach. This diet may also be used for other gastrointestinal surgeries, such as Heller myotomy and repair of achalasia. The diet will help control diarrhea, excess gas and swallowing problems, which may occur after this type of surgery. Keeping Your Stomach from Stretching Eat small, frequent meals (six to eight per day). This will help you consume the majority of the nutrients you need without causing your stomach to feel full or distended.  Drinking large amounts of fluids with meals can stretch your stomach. You may drink fluids between meals as often as you like, but limit fluids to 1/2 cup (4 fluid ounces) with meals and one cup (8 fluid ounces) with snacks.  Sit upright while eating and stay upright for 30 minutes after each meal. Gravity can help food move through your digestive  tract. Do not lie down after eating. Sit upright for 2 hours after your last meal or snack of the day.  Eat very slowly. Take your time when eating.  Take small bites and chew your food well to help aid in swallowing and digestion.  Avoid crusty breads and sticky, gummy foods, such as bananas, fresh doughy breads, rolls and doughnuts. These types of foods become sticky and difficult to swallow.  Toasted breads tend to be better tolerated.  Lastly, if you eat sweets, consume them at the end of your meal to avoid a group of symptoms referred to as "dumping syndrome". This describes the rapid emptying of foods from the stomach to the small intestine. Sweetened beverages, candy and desserts move more rapidly and dump quickly into the intestines. This can cause symptoms of nausea, weakness, cold sweats, cramps, diarrhea and dizzy spells.  Avoiding Gas Avoid drinking through a straw. Do not chew gum or tobacco. These actions cause you to swallow air, which produces excess gas in your stomach. Chew with your mouth closed.  Avoid any foods that cause stomach gas and distention. These foods include corn, dried beans, peas, lentils, onions, broccoli, cauliflower  and any food from the cabbage family.  Avoid carbonated drinks, alcohol, citrus and tomato products.  When will I be able to eat a soft diet? After Nissen fundoplication surgery, your diet will be advanced slowly by your surgeon. Generally, you will be on a clear liquid diet for the first few meals. Then you will advance to the full liquid diet for a meal or two and eventually to a Nissen soft diet. Please be aware that each patient's tolerance to food is different. Your doctor will advance your diet depending on how well you progress after surgery. Clear Liquid Diet  The first diet after surgery is the clear liquid diet. It includes the following liquids: Apple juice  Cranberry juice  Grape juice  Chicken broth  Beef broth  Flavored gelatin  (Jell-O)  Decaf tea and coffee  Caffeinated beverages are permitted based on tolerance  Popsicles  [place] ice Carbonated drinks (sodas) are not allowed for the first six to eight weeks after surgery. After this time you can try them again in small amounts.  Full Liquid Diet The full liquid diet contains anything on the clear liquid diet, plus: Milk, soy, rice and almond (no chocolate)  Cream of wheat, cream of rice, grits  Strained creamed soups (no tomato or broccoli)  Vanilla and strawberry-flavored ice cream  Sherbet  Blended, custard styled or whipped yogurt (plain or vanilla only)  Vanilla and butterscotch pudding (no chocolate or coconut)  Nutritional drinks including Ensure, Boost, Carnation Instant Breakfast (no chocolate-flavored) Note: Dairy products, such as milk, ice cream and pudding, may cause diarrhea in some people just after surgery. You may need to avoid milk products. If so, substitute them with lactose-free beverages, such as soy, rice, Lactaid or almond milks.  Nissen Soft Diet Food Category Foods to Choose Foods to Avoid  Beverages Milk, such as, whole, 2%, 1%, non-fat, or skim, soy, rice, almond  Caffeinated and decaf tea and coffee  Powdered drink mixes (in moderation)  Non-citrus juices (apple, grape, cranberry or blends of these)  Fruit nectars  Nutritional drinks including Boost, Ensure, Carnation Instant Breakfast Chocolate milk, cocoa or other chocolate-flavored drinks  Carbonated drinks  Alcohol  Citrus juices like orange, grapefruit, lemon and lime  Breads Crackers (saltine, butter, soda, graham, Goldfish and Cheese Nips)   Untoasted bread, bagels, [place] and hard rolls, English muffins  Crackers with nuts, seeds, fresh or dried fruit, coconut, or highly seasoned, such as garlic or onion-flavored  Sweet rolls, coffee cake or doughnuts  Cereals Well cooked cereals, such as oatmeal (plain or flavored)  Cold cereal (Cornflakes, Rice  Krispies, Cheerios, Special K plain, Rice Chex and puffed rice) Very coarse cereal, such as bran, shredded wheat  Any cereal with fresh or dried fruit, coconut, seeds or nuts  Desserts Eat in moderation and do not eat desserts or sweets by themselves. Plain cakes, cookies and cream-filled pies  Vanilla and butterscotch pudding or custard  Ice cream, ice milk, frozen yogurt and sherbet  Gelatin made from allowed foods  Fruit ices and popsicles Desserts containing chocolate, coconut, nuts, seeds, fresh or dried fruit, peppermint or spearmint  Eggs  Poached, hard boiled or scrambled Fried eggs and highly seasoned eggs (deviled eggs)  Fats Eat in moderation. Butter and margarine  Mayonnaise and vegetable oils  Mildly seasoned cream sauces and gravies  Plain cream cheese  Sour cream Highly seasoned salad dressings, cream sauces and gravies  Bacon, bacon fat, ham fat, lard and salt pork  Foy [place]  foods  Nuts  Fruits Fruit juice  Any canned or cooked fruit except those listed in the AVOID column ALL fresh fruits, such as citrus, apples, and pineapple  Canned pineapple  Dried fruits, such as raisins, berries  Fruits with seeds, such as berries, kiwi and figs  Meat, Fish, Poultry, and [employer] may be ground, minced or chopped to ease swallowing and digestion  Tender, well cooked and moist cuts of beef, chicken, [place] and pork  Veal and lamb  Flaky, cooked fish  Canned tuna  Cottage and ricotta cheeses  Mild cheese, such as American, brick, mozzarella and baby Swiss  Creamy peanut butter  Plain custard or blended fruit yogurt  Moist casseroles, such as macaroni & cheese, tuna noodle  Grilled or toasted cheese sandwich Tough meats with a lot of gristle  Fried, highly seasoned, smoked and fatty meat, fish or poultry, such as frankfurters, luncheon meats, sausage, bacon, spare ribs, beef brisket, sardines, anchovies, duck and goose  Chili and other entrees made with pepper or  chili pepper  Shellfish  Strongly flavored cheeses, such as sharp cheese, extra sharp cheddar, cheese containing peppers or other seasonings  Crunchy peanut butter  Any yogurt with nuts, seeds, coconut, strawberries or raspberries  Potatoes and Starches Peeled, mashed or boiled white or sweet potatoes  Oven-baked potatoes without skin  Well cooked white rice, enriched noodles, barley, spaghetti, macaroni and other pastas Fried potatoes, potato skins and potato chips  Hard and soft taco shells  Fried, brown or wild rice  Soups Mildly flavored meat stocks  Cream soups made from allowed foods Highly seasoned soups and tomato based soups, cream soups made with gas producing vegetables, such as broccoli, cauliflower, onion, etc.  Sweets and Snacks Use in moderation and do not eat large amounts of sweets by themselves. Syrup, honey, jelly and seedless jam  Plain hard candies and plain candies made with allowed ingredients  Molasses  Marshmallows  Other candy made from allowed ingredients  Thin pretzels Jam, marmalade and preserves  Chocolate in any form  Any candy containing nuts, coconut, seeds, peppermint, spearmint or dried or fresh fruit  Popcorn, potato chips, tortilla chips  Soft or hard thick pretzels, such as sourdough  Vegetables Well cooked soft vegetables without seeds or skins, such as asparagus tips, beets, carrots, green and wax beans, chopped spinach, tender canned baby peas, squash and pumpkin Raw vegetables, tomatoes, tomato juice, tomato sauce and V-8 juice(tomato based products can irritate the stomach)  Gas producing vegetables, such as broccoli, Brussel sprouts, cabbage, cauliflower, onions, corn, cucumber, green peppers, rutabagas, turnips, radishes and sauerkraut  Dried beans, peas and lentils  Miscellaneous Salt and spices in moderation  Mustard and vinegar in moderation Fried or highly seasoned foods  Coconut and seeds  Pickles and olives  Chili sauces, ketchup,  barbecue sauce, horseradish, black pepper, chili powder and onion and garlic seasonings  Any other strongly flavored seasoning, condiment, spice or herb not tolerated  Any food not tolerated

## 2023-03-04 ENCOUNTER — Encounter: Payer: Self-pay | Admitting: Surgery

## 2023-03-04 NOTE — H&P (View-Only) (Signed)
 Patient ID: Emma Fowler, female   DOB: Dec 12, 1985, 37 y.o.   MRN: [id number]  HPI Emma Fowler is a 37 y.o. female seen in f/u for hiatal hernias. She Complains of Left upper quadrant abdominal pain after some meals, pain is moderate , intermittent. She denies dysphagia . 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  She has a history of chronic pain and she is taking Suboxone She Did have a prior history of total abdominal hysterectomy with BSO. She is able to perform more than 4 METS of activity without any shortness of breath or chest pain. Completed her CT as well as a barium swallow and have personally reviewed them.  Showing evidence of type III to large paraesophageal hernia w at least 2/3 of stomach within the mediastinum.   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    Past Surgical History:  Procedure Laterality Date   BILATERAL SALPINGECTOMY Bilateral 10/22/2020   Procedure: BILATERAL SALPINGECTOMY;  Surgeon: Schermerhorn, Ihor Austin, MD;  Location: [facility];  Service: Gynecology;  Laterality: Bilateral;   COLONOSCOPY WITH PROPOFOL N/A 12/02/2022   Procedure: COLONOSCOPY WITH PROPOFOL;  Surgeon: Toney Reil, MD;  Location: [facility];  Service: Endoscopy;  Laterality: N/A;   DILATION AND CURETTAGE OF UTERUS     ESOPHAGOGASTRODUODENOSCOPY (EGD) WITH PROPOFOL N/A 12/02/2022   Procedure: ESOPHAGOGASTRODUODENOSCOPY (EGD) WITH PROPOFOL;  Surgeon: Toney Reil, MD;  Location: [facility];  Service: Endoscopy;  Laterality: N/A;   TONSILLECTOMY     TUBAL LIGATION     VAGINAL HYSTERECTOMY N/A 10/22/2020   Procedure:  HYSTERECTOMY VAGINAL;  Surgeon: Schermerhorn, Ihor Austin, MD;  Location: [facility];  Service: Gynecology;  Laterality: N/A;    Family History  Problem Relation Age of Onset   ADD / ADHD Son     Social History Social History   Tobacco Use   Smoking status: Former    Types: Cigarettes   Smokeless tobacco: Never  Vaping Use   Vaping status: Every Day   Substances: Nicotine, Flavoring  Substance Use Topics   Alcohol use: Yes    Comment: occ   Drug use: Not Currently    Types: Other-see comments    Comment: narcotic abuse-taking Suboxone    No Known Allergies  Current Outpatient Medications  Medication Sig Dispense Refill   buprenorphine-naloxone (SUBOXONE) 8-2 mg SUBL SL tablet Place 1.5 tablets under the tongue daily.     Iron-FA-B Cmp-C-Biot-Probiotic (FUSION PLUS) CAPS Take 1 capsule by mouth daily. 15 capsule 2   omeprazole (PRILOSEC) 20 MG capsule Take 20 mg by mouth daily.     Semaglutide-Weight Management (WEGOVY) 0.25 MG/0.5ML SOAJ Inject 0.25 mg into the skin once a week.     No current facility-administered medications for this visit.     Review of Systems Full ROS  was asked and was negative except for the information on the HPI  Physical Exam Blood pressure 112/73, pulse 80, temperature 98.5 F (36.9 C), temperature source Oral, height 5\' 4"  (1.626 m), weight 190 lb 3.2 oz (86.3 kg), last menstrual period 09/13/2020. CONSTITUTIONAL:  NAD. EYES: Pupils are equal, round, Sclera are non-icteric. EARS, NOSE, MOUTH AND THROAT: The oropharynx is clear. The oral mucosa is pink and moist. Hearing is intact to voice. LYMPH NODES:  Lymph nodes in the neck are normal. RESPIRATORY:  Lungs are clear. There is normal respiratory effort, with equal breath sounds bilaterally, and without pathologic use of accessory muscles. CARDIOVASCULAR: Heart is regular without murmurs, gallops, or rubs. GI: The abdomen is  soft, nontender, and nondistended. There are no palpable masses.  There is no hepatosplenomegaly. There are normal bowel sounds in all quadrants. GU: Rectal deferred.   MUSCULOSKELETAL: Normal muscle strength and tone. No cyanosis or edema.   SKIN: Turgor is good and there are no pathologic skin lesions or ulcers. NEUROLOGIC: Motor and sensation is grossly normal. Cranial nerves are grossly intact. PSYCH:  Oriented to person, place and time. Affect is normal.  Data Reviewed  I have personally reviewed the patient's imaging, laboratory findings and medical records.    Assessment/Plan 37 yo female with large paraesophageal hernia  type III.  Discussed with the patient in detail about her disease process.   I Do think that the hernia is amenable for robotic repair.  We did talk about the role for robotic paraesophageal hernia repair with Nissen Fundoplication..  The risks, the benefits and the possible complications including but not limited to: Bleeding, infection bowel and esophageal injuries.  Also we discussed about diet modifications.  She seems interested. Will plan for robotic paraesophageal hernia repair with fundoplication in January.  I spent 40 minutes in this encounter including extensive review of medical records, personally reviewing imaging studies, coordinating her care, placing orders, counseling the patient and performing appropriate documentation  Sterling Big, MD FACS General Surgeon 03/04/2023, 2:08 PM

## 2023-03-04 NOTE — Progress Notes (Signed)
Patient ID: Emma Fowler, female   DOB: Dec 12, 1985, 37 y.o.   MRN: [id number]  HPI Emma Fowler is a 37 y.o. female seen in f/u for hiatal hernias. She Complains of Left upper quadrant abdominal pain after some meals, pain is moderate , intermittent. She denies dysphagia . 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  She has a history of chronic pain and she is taking Suboxone She Did have a prior history of total abdominal hysterectomy with BSO. She is able to perform more than 4 METS of activity without any shortness of breath or chest pain. Completed her CT as well as a barium swallow and have personally reviewed them.  Showing evidence of type III to large paraesophageal hernia w at least 2/3 of stomach within the mediastinum.   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    Past Surgical History:  Procedure Laterality Date   BILATERAL SALPINGECTOMY Bilateral 10/22/2020   Procedure: BILATERAL SALPINGECTOMY;  Surgeon: Schermerhorn, Ihor Austin, MD;  Location: [facility];  Service: Gynecology;  Laterality: Bilateral;   COLONOSCOPY WITH PROPOFOL N/A 12/02/2022   Procedure: COLONOSCOPY WITH PROPOFOL;  Surgeon: Toney Reil, MD;  Location: [facility];  Service: Endoscopy;  Laterality: N/A;   DILATION AND CURETTAGE OF UTERUS     ESOPHAGOGASTRODUODENOSCOPY (EGD) WITH PROPOFOL N/A 12/02/2022   Procedure: ESOPHAGOGASTRODUODENOSCOPY (EGD) WITH PROPOFOL;  Surgeon: Toney Reil, MD;  Location: [facility];  Service: Endoscopy;  Laterality: N/A;   TONSILLECTOMY     TUBAL LIGATION     VAGINAL HYSTERECTOMY N/A 10/22/2020   Procedure:  HYSTERECTOMY VAGINAL;  Surgeon: Schermerhorn, Ihor Austin, MD;  Location: [facility];  Service: Gynecology;  Laterality: N/A;    Family History  Problem Relation Age of Onset   ADD / ADHD Son     Social History Social History   Tobacco Use   Smoking status: Former    Types: Cigarettes   Smokeless tobacco: Never  Vaping Use   Vaping status: Every Day   Substances: Nicotine, Flavoring  Substance Use Topics   Alcohol use: Yes    Comment: occ   Drug use: Not Currently    Types: Other-see comments    Comment: narcotic abuse-taking Suboxone    No Known Allergies  Current Outpatient Medications  Medication Sig Dispense Refill   buprenorphine-naloxone (SUBOXONE) 8-2 mg SUBL SL tablet Place 1.5 tablets under the tongue daily.     Iron-FA-B Cmp-C-Biot-Probiotic (FUSION PLUS) CAPS Take 1 capsule by mouth daily. 15 capsule 2   omeprazole (PRILOSEC) 20 MG capsule Take 20 mg by mouth daily.     Semaglutide-Weight Management (WEGOVY) 0.25 MG/0.5ML SOAJ Inject 0.25 mg into the skin once a week.     No current facility-administered medications for this visit.     Review of Systems Full ROS  was asked and was negative except for the information on the HPI  Physical Exam Blood pressure 112/73, pulse 80, temperature 98.5 F (36.9 C), temperature source Oral, height 5\' 4"  (1.626 m), weight 190 lb 3.2 oz (86.3 kg), last menstrual period 09/13/2020. CONSTITUTIONAL:  NAD. EYES: Pupils are equal, round, Sclera are non-icteric. EARS, NOSE, MOUTH AND THROAT: The oropharynx is clear. The oral mucosa is pink and moist. Hearing is intact to voice. LYMPH NODES:  Lymph nodes in the neck are normal. RESPIRATORY:  Lungs are clear. There is normal respiratory effort, with equal breath sounds bilaterally, and without pathologic use of accessory muscles. CARDIOVASCULAR: Heart is regular without murmurs, gallops, or rubs. GI: The abdomen is  soft, nontender, and nondistended. There are no palpable masses.  There is no hepatosplenomegaly. There are normal bowel sounds in all quadrants. GU: Rectal deferred.   MUSCULOSKELETAL: Normal muscle strength and tone. No cyanosis or edema.   SKIN: Turgor is good and there are no pathologic skin lesions or ulcers. NEUROLOGIC: Motor and sensation is grossly normal. Cranial nerves are grossly intact. PSYCH:  Oriented to person, place and time. Affect is normal.  Data Reviewed  I have personally reviewed the patient's imaging, laboratory findings and medical records.    Assessment/Plan 37 yo female with large paraesophageal hernia  type III.  Discussed with the patient in detail about her disease process.   I Do think that the hernia is amenable for robotic repair.  We did talk about the role for robotic paraesophageal hernia repair with Nissen Fundoplication..  The risks, the benefits and the possible complications including but not limited to: Bleeding, infection bowel and esophageal injuries.  Also we discussed about diet modifications.  She seems interested. Will plan for robotic paraesophageal hernia repair with fundoplication in January.  I spent 40 minutes in this encounter including extensive review of medical records, personally reviewing imaging studies, coordinating her care, placing orders, counseling the patient and performing appropriate documentation  Sterling Big, MD FACS General Surgeon 03/04/2023, 2:08 PM

## 2023-03-05 ENCOUNTER — Telehealth: Payer: Self-pay

## 2023-03-05 NOTE — Telephone Encounter (Signed)
Message left for the patient of Pre-Admission date/time, and Surgery date at [facility].  Surgery Date: 03/26/2023 Preadmission Testing Date: 03/18/2023 (phone 1pm-5pm)  Patient has been made aware to call [phone], between 1-3:00pm the day before surgery, to find out what time to arrive for surgery.

## 2023-03-18 ENCOUNTER — Encounter
Admission: RE | Admit: 2023-03-18 | Discharge: 2023-03-18 | Disposition: A | Discharge status: Home / Self Care | Payer: Medicaid Other | Source: Ambulatory Visit | Attending: Surgery | Admitting: Surgery

## 2023-03-18 ENCOUNTER — Other Ambulatory Visit: Payer: Self-pay

## 2023-03-18 NOTE — Patient Instructions (Addendum)
 Your procedure is scheduled nw:Uylmdijb January 16  Report to the Registration Desk on the 1st floor of the [facility] To find out your arrival time, please call [phone] between 1PM - 3PM on:  Wednesday January 15  If your arrival time is 6:00 am, do not arrive before that time as the [facility] entrance doors do not open until 6:00 am.  REMEMBER: Instructions that are not followed completely may result in serious medical risk, up to and including death; or upon the discretion of your surgeon and anesthesiologist your surgery may need to be rescheduled.  Do not eat food after midnight the night before surgery.  No gum chewing or hard candies.  You may however, drink CLEAR liquids up to 2 hours before you are scheduled to arrive for your surgery. Do not drink anything within 2 hours of your scheduled arrival time.  Clear liquids include: - water   - apple juice without pulp - gatorade (not RED colors) - black coffee or tea (Do NOT add milk or creamers to the coffee or tea) Do NOT drink anything that is not on this list.   One week prior to surgery: Starting Thursday January 9 Stop Anti-inflammatories (NSAIDS) such as Advil , Aleve , Ibuprofen , Motrin , Naproxen , Naprosyn  and Aspirin based products such as Excedrin, Goody's Powder, BC Powder. Stop ANY OVER THE COUNTER supplements until after surgery.  WEGOVY hold for 7 last dose Wednesday January 8   You may however, continue to take Tylenol  if needed for pain up until the day of surgery.  Continue taking all of your other prescription medications up until the day of surgery.  ON THE DAY OF SURGERY DO NOT TAKE ANY MEDICATIONS.  No Alcohol for 24 hours before or after surgery.  No Smoking including e-cigarettes for 24 hours before surgery.  No chewable tobacco products for at least 6 hours before surgery.  No nicotine patches on the day of surgery.  Do not use any recreational drugs for at least a week (preferably 2  weeks) before your surgery.  Please be advised that the combination of cocaine and anesthesia may have negative outcomes, up to and including death. If you test positive for cocaine, your surgery will be cancelled.  On the morning of surgery brush your teeth with toothpaste and water , you may rinse your mouth with mouthwash if you wish. Do not swallow any toothpaste or mouthwash.  Use CHG Soap as directed on instruction sheet.  Do not wear jewelry, make-up, hairpins, clips or nail polish.  For welded (permanent) jewelry: bracelets, anklets, waist bands, etc.  Please have this removed prior to surgery.  If it is not removed, there is a chance that hospital personnel will need to cut it off on the day of surgery.  Do not wear lotions, powders, or perfumes.   Do not shave body hair from the neck down 48 hours before surgery.  Contact lenses, hearing aids and dentures may not be worn into surgery.  Do not bring valuables to the hospital. [facility] is not responsible for any missing/lost belongings or valuables.   Notify your doctor if there is any change in your medical condition (cold, fever, infection).  Wear comfortable clothing (specific to your surgery type) to the hospital.  After surgery, you can help prevent lung complications by doing breathing exercises.  Take deep breaths and cough every 1-2 hours.  When coughing or sneezing, hold a pillow firmly against your incision with both hands. This is called "splinting." Doing  this helps protect your incision. It also decreases belly discomfort.   If you are being admitted to the hospital overnight, leave your suitcase in the car. After surgery it may be brought to your room.  In case of increased patient census, it may be necessary for you, the patient, to continue your postoperative care in the Same Day Surgery department. If you are being discharged the day of surgery, you will not be allowed to drive home. You will need a  responsible individual to drive you home and stay with you for 24 hours after surgery.   If you are taking public transportation, you will need to have a responsible individual with you.  Please call the Pre-admissions Testing Dept. at [phone] if you have any questions about these instructions.  Surgery Visitation Policy:  Patients having surgery or a procedure may have two visitors.  Children under the age of 45 must have an adult with them who is not the patient.   Inpatient Visitation:    Visiting hours are 7 a.m. to 8 p.m. Up to four visitors are allowed at one time in a patient room. The visitors may rotate out with other people during the day.  One visitor age 69 or older may stay with the patient overnight and must be in the room by 8 p.m.           Preparing for Surgery with CHLORHEXIDINE  GLUCONATE (CHG) Soap  Chlorhexidine  Gluconate (CHG) Soap  o An antiseptic cleaner that kills germs and bonds with the skin to continue killing germs even after washing  o Used for showering the night before surgery and morning of surgery  Before surgery, you can play an important role by reducing the number of germs on your skin.  CHG (Chlorhexidine  gluconate) soap is an antiseptic cleanser which kills germs and bonds with the skin to continue killing germs even after washing.  Please do not use if you have an allergy to CHG or antibacterial soaps. If your skin becomes reddened/irritated stop using the CHG.  1. Shower the NIGHT BEFORE SURGERY and the MORNING OF SURGERY with CHG soap.  2. If you choose to wash your hair, wash your hair first as usual with your normal shampoo.  3. After shampooing, rinse your hair and body thoroughly to remove the shampoo.  4. Use CHG as you would any other liquid soap. You can apply CHG directly to the skin and wash gently with a scrungie or a clean washcloth.  5. Apply the CHG soap to your body only from the neck down. Do not use on  open wounds or open sores. Avoid contact with your eyes, ears, mouth, and genitals (private parts). Wash face and genitals (private parts) with your normal soap.  6. Wash thoroughly, paying special attention to the area where your surgery will be performed.  7. Thoroughly rinse your body with warm water .  8. Do not shower/wash with your normal soap after using and rinsing off the CHG soap.  9. Pat yourself dry with a clean towel.  10. Wear clean pajamas to bed the night before surgery.  11. Place clean sheets on your bed the night of your first shower and do not sleep with pets.  12. Shower again with the CHG soap on the day of surgery prior to arriving at the hospital.  13. Do not apply any deodorants/lotions/powders.  14. Please wear clean clothes to the hospital.

## 2023-03-25 MED ORDER — CEFAZOLIN SODIUM-DEXTROSE 2-4 GM/100ML-% IV SOLN
2.0000 g | INTRAVENOUS | Status: AC
  Administered 2023-03-26: 2 g via INTRAVENOUS

## 2023-03-25 MED ORDER — ORAL CARE MOUTH RINSE: 15.0000 mL | Freq: Once | OROMUCOSAL | Status: AC

## 2023-03-25 MED ORDER — CHLORHEXIDINE GLUCONATE CLOTH 2 % EX PADS
6.0000 | MEDICATED_PAD | Freq: Once | CUTANEOUS | Status: AC
  Administered 2023-03-26: 6 via TOPICAL

## 2023-03-25 MED ORDER — ACETAMINOPHEN 500 MG PO TABS
1000.0000 mg | ORAL_TABLET | ORAL | Status: AC
  Administered 2023-03-26: 1000 mg via ORAL

## 2023-03-25 MED ORDER — CHLORHEXIDINE GLUCONATE CLOTH 2 % EX PADS: 6.0000 | MEDICATED_PAD | Freq: Once | CUTANEOUS | Status: DC

## 2023-03-25 MED ORDER — GABAPENTIN 300 MG PO CAPS
300.0000 mg | ORAL_CAPSULE | ORAL | Status: AC
  Administered 2023-03-26: 300 mg via ORAL

## 2023-03-25 MED ORDER — CHLORHEXIDINE GLUCONATE 0.12 % MT SOLN
15.0000 mL | Freq: Once | OROMUCOSAL | Status: AC
  Administered 2023-03-26: 15 mL via OROMUCOSAL

## 2023-03-26 ENCOUNTER — Observation Stay
Admission: RE | Admit: 2023-03-26 | Discharge: 2023-03-27 | Disposition: A | Discharge status: Home / Self Care | Payer: Medicaid Other | Attending: Surgery | Admitting: Surgery

## 2023-03-26 ENCOUNTER — Ambulatory Visit: Payer: Medicaid Other | Admitting: Urgent Care

## 2023-03-26 ENCOUNTER — Other Ambulatory Visit: Payer: Self-pay

## 2023-03-26 ENCOUNTER — Ambulatory Visit: Payer: Medicaid Other | Admitting: Anesthesiology

## 2023-03-26 ENCOUNTER — Encounter
Admission: RE | Disposition: A | Discharge status: Home / Self Care | Payer: Self-pay | Source: Home / Self Care | Attending: Surgery

## 2023-03-26 ENCOUNTER — Encounter: Payer: Self-pay | Admitting: Surgery

## 2023-03-26 DIAGNOSIS — Z87891 Personal history of nicotine dependence: Secondary | ICD-10-CM | POA: Insufficient documentation

## 2023-03-26 DIAGNOSIS — K449 Diaphragmatic hernia without obstruction or gangrene: Principal | ICD-10-CM | POA: Insufficient documentation

## 2023-03-26 DIAGNOSIS — Z8719 Personal history of other diseases of the digestive system: Principal | ICD-10-CM

## 2023-03-26 DIAGNOSIS — K219 Gastro-esophageal reflux disease without esophagitis: Secondary | ICD-10-CM | POA: Insufficient documentation

## 2023-03-26 HISTORY — PX: XI ROBOTIC ASSISTED PARAESOPHAGEAL HERNIA REPAIR: SHX6871

## 2023-03-26 HISTORY — PX: INSERTION OF MESH: SHX5868

## 2023-03-26 LAB — CBC
HCT: 36.8 % (ref 36.0–46.0)
Hemoglobin: 12.4 g/dL (ref 12.0–15.0)
MCH: 28.8 pg (ref 26.0–34.0)
MCHC: 33.7 g/dL (ref 30.0–36.0)
MCV: 85.4 fL (ref 80.0–100.0)
Platelets: 331 10*3/uL (ref 150–400)
RBC: 4.31 MIL/uL (ref 3.87–5.11)
RDW: 13.2 % (ref 11.5–15.5)
WBC: 20.3 10*3/uL — ABNORMAL HIGH (ref 4.0–10.5)
nRBC: 0 % (ref 0.0–0.2)

## 2023-03-26 LAB — CREATININE, SERUM
Creatinine, Ser: 0.6 mg/dL (ref 0.44–1.00)
GFR, Estimated: >60 mL/min (ref >60)

## 2023-03-26 SURGERY — REPAIR, HERNIA, PARAESOPHAGEAL, ROBOT-ASSISTED
Anesthesia: General

## 2023-03-26 MED ORDER — OXYCODONE HCL 5 MG PO TABS
5.0000 mg | ORAL_TABLET | ORAL | Status: DC | PRN
Start: 2023-03-26 — End: 2023-03-27

## 2023-03-26 MED ORDER — PROPOFOL 10 MG/ML IV BOLUS
INTRAVENOUS | Status: DC | PRN
  Administered 2023-03-26: 160 mg via INTRAVENOUS

## 2023-03-26 MED ORDER — GABAPENTIN 300 MG PO CAPS
ORAL_CAPSULE | ORAL | Status: AC
  Filled 2023-03-26: qty 1

## 2023-03-26 MED ORDER — DEXAMETHASONE SODIUM PHOSPHATE 10 MG/ML IJ SOLN
INTRAMUSCULAR | Status: DC | PRN
  Administered 2023-03-26: 10 mg via INTRAVENOUS

## 2023-03-26 MED ORDER — PROPOFOL 10 MG/ML IV BOLUS
INTRAVENOUS | Status: AC
  Filled 2023-03-26: qty 20

## 2023-03-26 MED ORDER — KETOROLAC TROMETHAMINE 30 MG/ML IJ SOLN
INTRAMUSCULAR | Status: AC
  Filled 2023-03-26: qty 1

## 2023-03-26 MED ORDER — BUPIVACAINE-EPINEPHRINE (PF) 0.25% -1:200000 IJ SOLN
INTRAMUSCULAR | Status: AC
  Filled 2023-03-26: qty 30

## 2023-03-26 MED ORDER — ONDANSETRON HCL 4 MG/2ML IJ SOLN
4.0000 mg | Freq: Once | INTRAMUSCULAR | Status: AC | PRN
  Administered 2023-03-26: 4 mg via INTRAVENOUS

## 2023-03-26 MED ORDER — SUGAMMADEX SODIUM 200 MG/2ML IV SOLN
INTRAVENOUS | Status: DC | PRN
  Administered 2023-03-26: 337.6 mg via INTRAVENOUS

## 2023-03-26 MED ORDER — DIPHENHYDRAMINE HCL 50 MG/ML IJ SOLN: 12.5000 mg | Freq: Four times a day (QID) | INTRAMUSCULAR | Status: DC | PRN

## 2023-03-26 MED ORDER — KETAMINE HCL 50 MG/5ML IJ SOSY
PREFILLED_SYRINGE | INTRAMUSCULAR | Status: DC | PRN
  Administered 2023-03-26: 30 mg via INTRAVENOUS
  Administered 2023-03-26 (×2): 10 mg via INTRAVENOUS

## 2023-03-26 MED ORDER — PROCHLORPERAZINE MALEATE 10 MG PO TABS
10.0000 mg | ORAL_TABLET | Freq: Four times a day (QID) | ORAL | Status: DC | PRN
Start: 2023-03-26 — End: 2023-03-27

## 2023-03-26 MED ORDER — LACTATED RINGERS IV SOLN
INTRAVENOUS | Status: DC
Start: 2023-03-26 — End: 2023-03-26

## 2023-03-26 MED ORDER — PHENYLEPHRINE 80 MCG/ML (10ML) SYRINGE FOR IV PUSH (FOR BLOOD PRESSURE SUPPORT)
PREFILLED_SYRINGE | INTRAVENOUS | Status: AC
  Filled 2023-03-26: qty 10

## 2023-03-26 MED ORDER — PROCHLORPERAZINE EDISYLATE 10 MG/2ML IJ SOLN
5.0000 mg | Freq: Four times a day (QID) | INTRAMUSCULAR | Status: DC | PRN
  Administered 2023-03-26: 10 mg via INTRAVENOUS
  Filled 2023-03-26 (×2): qty 2

## 2023-03-26 MED ORDER — ACETAMINOPHEN 10 MG/ML IV SOLN
INTRAVENOUS | Status: AC
  Filled 2023-03-26: qty 100

## 2023-03-26 MED ORDER — PROPOFOL 500 MG/50ML IV EMUL
INTRAVENOUS | Status: DC | PRN
  Administered 2023-03-26: 20 ug/kg/min via INTRAVENOUS

## 2023-03-26 MED ORDER — MIDAZOLAM HCL 2 MG/2ML IJ SOLN
INTRAMUSCULAR | Status: DC | PRN
  Administered 2023-03-26: 2 mg via INTRAVENOUS

## 2023-03-26 MED ORDER — ACETAMINOPHEN 10 MG/ML IV SOLN
1000.0000 mg | Freq: Once | INTRAVENOUS | Status: DC | PRN
  Administered 2023-03-26: 1000 mg via INTRAVENOUS

## 2023-03-26 MED ORDER — DEXMEDETOMIDINE HCL IN NACL 80 MCG/20ML IV SOLN
INTRAVENOUS | Status: DC | PRN
  Administered 2023-03-26 (×2): 4 ug via INTRAVENOUS

## 2023-03-26 MED ORDER — DEXAMETHASONE SODIUM PHOSPHATE 10 MG/ML IJ SOLN
INTRAMUSCULAR | Status: AC
  Filled 2023-03-26: qty 1

## 2023-03-26 MED ORDER — EPHEDRINE SULFATE-NACL 50-0.9 MG/10ML-% IV SOSY
PREFILLED_SYRINGE | INTRAVENOUS | Status: DC | PRN
  Administered 2023-03-26: 10 mg via INTRAVENOUS

## 2023-03-26 MED ORDER — CYCLOBENZAPRINE HCL 10 MG PO TABS: 5.0000 mg | ORAL_TABLET | Freq: Three times a day (TID) | ORAL | Status: DC | PRN

## 2023-03-26 MED ORDER — METHYLENE BLUE (ANTIDOTE) 1 % IV SOLN
INTRAVENOUS | Status: DC | PRN
  Administered 2023-03-26: 60 mg

## 2023-03-26 MED ORDER — BUPIVACAINE-EPINEPHRINE 0.25% -1:200000 IJ SOLN
INTRAMUSCULAR | Status: DC | PRN
  Administered 2023-03-26: 50 mL

## 2023-03-26 MED ORDER — OXYCODONE HCL 5 MG/5ML PO SOLN
5.0000 mg | Freq: Once | ORAL | Status: AC | PRN
Start: 2023-03-26 — End: 2023-03-26

## 2023-03-26 MED ORDER — KETOROLAC TROMETHAMINE 30 MG/ML IJ SOLN
30.0000 mg | Freq: Four times a day (QID) | INTRAMUSCULAR | Status: DC
  Administered 2023-03-26 – 2023-03-27 (×3): 30 mg via INTRAVENOUS
  Filled 2023-03-26 (×2): qty 1

## 2023-03-26 MED ORDER — METHOCARBAMOL 500 MG PO TABS: 500.0000 mg | ORAL_TABLET | Freq: Three times a day (TID) | ORAL | Status: DC | PRN

## 2023-03-26 MED ORDER — METHYLENE BLUE (ANTIDOTE) 1 % IV SOLN
INTRAVENOUS | Status: AC
  Filled 2023-03-26: qty 10

## 2023-03-26 MED ORDER — MIDAZOLAM HCL 2 MG/2ML IJ SOLN
INTRAMUSCULAR | Status: AC
  Filled 2023-03-26: qty 2

## 2023-03-26 MED ORDER — CHLORHEXIDINE GLUCONATE 0.12 % MT SOLN
OROMUCOSAL | Status: AC
  Filled 2023-03-26: qty 15

## 2023-03-26 MED ORDER — VISTASEAL 10 ML SINGLE DOSE KIT
PACK | CUTANEOUS | Status: AC
  Filled 2023-03-26: qty 10

## 2023-03-26 MED ORDER — ONDANSETRON HCL 4 MG/2ML IJ SOLN: 4.0000 mg | Freq: Four times a day (QID) | INTRAMUSCULAR | Status: DC | PRN

## 2023-03-26 MED ORDER — FENTANYL CITRATE (PF) 100 MCG/2ML IJ SOLN
INTRAMUSCULAR | Status: AC
  Filled 2023-03-26: qty 2

## 2023-03-26 MED ORDER — METHOCARBAMOL 1000 MG/10ML IJ SOLN: 500.0000 mg | Freq: Three times a day (TID) | INTRAMUSCULAR | Status: DC | PRN

## 2023-03-26 MED ORDER — ONDANSETRON HCL 4 MG/2ML IJ SOLN
INTRAMUSCULAR | Status: AC
  Filled 2023-03-26: qty 2

## 2023-03-26 MED ORDER — SODIUM CHLORIDE 0.9 % IV SOLN: INTRAVENOUS | Status: DC

## 2023-03-26 MED ORDER — LIDOCAINE HCL (CARDIAC) PF 100 MG/5ML IV SOSY
PREFILLED_SYRINGE | INTRAVENOUS | Status: DC | PRN
  Administered 2023-03-26: 80 mg via INTRAVENOUS

## 2023-03-26 MED ORDER — ROCURONIUM BROMIDE 10 MG/ML (PF) SYRINGE
PREFILLED_SYRINGE | INTRAVENOUS | Status: AC
  Filled 2023-03-26: qty 10

## 2023-03-26 MED ORDER — ONDANSETRON HCL 4 MG/2ML IJ SOLN
INTRAMUSCULAR | Status: DC | PRN
  Administered 2023-03-26: 4 mg via INTRAVENOUS

## 2023-03-26 MED ORDER — CEFAZOLIN SODIUM-DEXTROSE 2-4 GM/100ML-% IV SOLN
INTRAVENOUS | Status: AC
  Filled 2023-03-26: qty 100

## 2023-03-26 MED ORDER — DEXMEDETOMIDINE HCL IN NACL 80 MCG/20ML IV SOLN
INTRAVENOUS | Status: AC
  Filled 2023-03-26: qty 20

## 2023-03-26 MED ORDER — ROCURONIUM BROMIDE 100 MG/10ML IV SOLN
INTRAVENOUS | Status: DC | PRN
  Administered 2023-03-26: 40 mg via INTRAVENOUS
  Administered 2023-03-26: 20 mg via INTRAVENOUS
  Administered 2023-03-26: 40 mg via INTRAVENOUS

## 2023-03-26 MED ORDER — BUPRENORPHINE HCL-NALOXONE HCL 8-2 MG SL SUBL
1.5000 | SUBLINGUAL_TABLET | Freq: Every day | SUBLINGUAL | Status: DC
  Administered 2023-03-27: 1.5 via SUBLINGUAL
  Filled 2023-03-26: qty 2

## 2023-03-26 MED ORDER — EPHEDRINE 5 MG/ML INJ
INTRAVENOUS | Status: AC
  Filled 2023-03-26: qty 5

## 2023-03-26 MED ORDER — ZOLPIDEM TARTRATE 5 MG PO TABS: 5.0000 mg | ORAL_TABLET | Freq: Every evening | ORAL | Status: DC | PRN

## 2023-03-26 MED ORDER — LACTATED RINGERS IV SOLN: INTRAVENOUS | Status: DC

## 2023-03-26 MED ORDER — HYDROMORPHONE HCL 1 MG/ML IJ SOLN: 1.0000 mg | INTRAMUSCULAR | Status: DC | PRN

## 2023-03-26 MED ORDER — PHENYLEPHRINE 80 MCG/ML (10ML) SYRINGE FOR IV PUSH (FOR BLOOD PRESSURE SUPPORT)
PREFILLED_SYRINGE | INTRAVENOUS | Status: DC | PRN
  Administered 2023-03-26 (×2): 80 ug via INTRAVENOUS

## 2023-03-26 MED ORDER — OXYCODONE HCL 5 MG PO TABS
5.0000 mg | ORAL_TABLET | Freq: Once | ORAL | Status: AC | PRN
  Administered 2023-03-26: 5 mg via ORAL

## 2023-03-26 MED ORDER — CEFAZOLIN SODIUM-DEXTROSE 2-4 GM/100ML-% IV SOLN
2.0000 g | Freq: Three times a day (TID) | INTRAVENOUS | Status: DC
  Administered 2023-03-27 (×2): 2 g via INTRAVENOUS
  Filled 2023-03-26 (×3): qty 100

## 2023-03-26 MED ORDER — ONDANSETRON 4 MG PO TBDP: 4.0000 mg | ORAL_TABLET | Freq: Four times a day (QID) | ORAL | Status: DC | PRN

## 2023-03-26 MED ORDER — FENTANYL CITRATE (PF) 100 MCG/2ML IJ SOLN
INTRAMUSCULAR | Status: DC | PRN
  Administered 2023-03-26 (×2): 50 ug via INTRAVENOUS

## 2023-03-26 MED ORDER — BUPIVACAINE LIPOSOME 1.3 % IJ SUSP
INTRAMUSCULAR | Status: AC
  Filled 2023-03-26: qty 20

## 2023-03-26 MED ORDER — ACETAMINOPHEN 500 MG PO TABS
ORAL_TABLET | ORAL | Status: AC
  Filled 2023-03-26: qty 2

## 2023-03-26 MED ORDER — ENOXAPARIN SODIUM 40 MG/0.4ML IJ SOSY
0.5000 mg/kg | PREFILLED_SYRINGE | INTRAMUSCULAR | Status: DC
  Filled 2023-03-26: qty 0.8

## 2023-03-26 MED ORDER — OXYCODONE HCL 5 MG PO TABS
ORAL_TABLET | ORAL | Status: AC
  Filled 2023-03-26: qty 1

## 2023-03-26 MED ORDER — KETOROLAC TROMETHAMINE 30 MG/ML IJ SOLN
INTRAMUSCULAR | Status: DC | PRN
  Administered 2023-03-26: 30 mg via INTRAVENOUS

## 2023-03-26 MED ORDER — PREGABALIN 50 MG PO CAPS
100.0000 mg | ORAL_CAPSULE | Freq: Three times a day (TID) | ORAL | Status: DC
  Administered 2023-03-27 (×2): 100 mg via ORAL
  Filled 2023-03-26 (×2): qty 2

## 2023-03-26 MED ORDER — HYDRALAZINE HCL 20 MG/ML IJ SOLN: 10.0000 mg | INTRAMUSCULAR | Status: DC | PRN

## 2023-03-26 MED ORDER — DIPHENHYDRAMINE HCL 12.5 MG/5ML PO ELIX: 12.5000 mg | ORAL_SOLUTION | Freq: Four times a day (QID) | ORAL | Status: DC | PRN

## 2023-03-26 MED ORDER — LIDOCAINE HCL (PF) 2 % IJ SOLN
INTRAMUSCULAR | Status: AC
  Filled 2023-03-26: qty 5

## 2023-03-26 MED ORDER — VISTASEAL 10 ML SINGLE DOSE KIT
PACK | CUTANEOUS | Status: DC | PRN
  Administered 2023-03-26: 10 mL via TOPICAL

## 2023-03-26 MED ORDER — FENTANYL CITRATE (PF) 100 MCG/2ML IJ SOLN
25.0000 ug | INTRAMUSCULAR | Status: DC | PRN
  Administered 2023-03-26 (×3): 50 ug via INTRAVENOUS

## 2023-03-26 MED ORDER — ACETAMINOPHEN 500 MG PO TABS
1000.0000 mg | ORAL_TABLET | Freq: Four times a day (QID) | ORAL | Status: DC
  Administered 2023-03-27 (×2): 1000 mg via ORAL
  Filled 2023-03-26 (×2): qty 2

## 2023-03-26 MED ORDER — SEVOFLURANE IN SOLN
RESPIRATORY_TRACT | Status: AC
  Filled 2023-03-26: qty 250

## 2023-03-26 SURGICAL SUPPLY — 53 items
APPLICATOR VISTASEAL 35 (MISCELLANEOUS) IMPLANT
CANNULA REDUCER 12-8 DVNC XI (CANNULA) ×2 IMPLANT
CLIP LIGATING HEM O LOK PURPLE (MISCELLANEOUS) IMPLANT
DERMABOND ADVANCED .7 DNX12 (GAUZE/BANDAGES/DRESSINGS) ×2 IMPLANT
DERMABOND ADVANCED .7 DNX6 (GAUZE/BANDAGES/DRESSINGS) IMPLANT
DRAPE ARM DVNC X/XI (DISPOSABLE) ×8 IMPLANT
DRAPE COLUMN DVNC XI (DISPOSABLE) ×2 IMPLANT
ELECT REM PT RETURN 9FT ADLT (ELECTROSURGICAL) ×2
ELECTRODE REM PT RTRN 9FT ADLT (ELECTROSURGICAL) ×2 IMPLANT
FORCEPS BPLR R/ABLATION 8 DVNC (INSTRUMENTS) ×2 IMPLANT
GLOVE BIO SURGEON STRL SZ7 (GLOVE) ×6 IMPLANT
GOWN STRL REUS W/ TWL LRG LVL3 (GOWN DISPOSABLE) ×8 IMPLANT
GRASPER LAPSCPC 5X45 DSP (INSTRUMENTS) ×2 IMPLANT
GRASPER TIP-UP FEN DVNC XI (INSTRUMENTS) ×2 IMPLANT
IRRIGATION STRYKERFLOW (MISCELLANEOUS) IMPLANT
IRRIGATOR STRYKERFLOW (MISCELLANEOUS) ×2
IV NS 1000ML BAXH (IV SOLUTION) IMPLANT
KIT IMAGING PINPOINTPAQ (MISCELLANEOUS) ×2 IMPLANT
KIT PINK PAD W/HEAD ARE REST (MISCELLANEOUS) ×2
KIT PINK PAD W/HEAD ARM REST (MISCELLANEOUS) ×2 IMPLANT
LABEL OR SOLS (LABEL) ×2 IMPLANT
MANIFOLD NEPTUNE II (INSTRUMENTS) ×2 IMPLANT
MESH BIO-A 7X10 SYN MAT (Mesh General) IMPLANT
NDL DRIVE SUT CUT DVNC (INSTRUMENTS) ×2 IMPLANT
NDL HYPO 22X1.5 SAFETY MO (MISCELLANEOUS) ×2 IMPLANT
NEEDLE DRIVE SUT CUT DVNC (INSTRUMENTS) ×2
NEEDLE HYPO 22X1.5 SAFETY MO (MISCELLANEOUS) ×2
OBTURATOR OPTICAL STND 8 DVNC (TROCAR) ×2
OBTURATOR OPTICALSTD 8 DVNC (TROCAR) ×2 IMPLANT
PACK LAP CHOLECYSTECTOMY (MISCELLANEOUS) ×2 IMPLANT
SEAL UNIV 5-12 XI (MISCELLANEOUS) ×8 IMPLANT
SEALER VESSEL EXT DVNC XI (MISCELLANEOUS) ×2 IMPLANT
SOL ELECTROSURG ANTI STICK (MISCELLANEOUS) ×2
SOLUTION ELECTROSURG ANTI STCK (MISCELLANEOUS) ×2 IMPLANT
SPIKE FLUID TRANSFER (MISCELLANEOUS) ×2 IMPLANT
SPONGE T-LAP 18X18 ~~LOC~~+RFID (SPONGE) ×2 IMPLANT
SUT MNCRL 4-0 27 PS-2 XMFL (SUTURE) ×4
SUT MNCRL 4-0 27XMFL (SUTURE) ×4
SUT SILK 2 0 SH (SUTURE) ×4 IMPLANT
SUT STRATA 2-0 23CM CT-2 (SUTURE) ×2 IMPLANT
SUT VIC AB 3-0 SH 27X BRD (SUTURE) IMPLANT
SUT VICRYL 0 UR6 27IN ABS (SUTURE) ×4 IMPLANT
SUTURE MNCRL 4-0 27XMF (SUTURE) ×2 IMPLANT
SYR 30ML LL (SYRINGE) ×2 IMPLANT
SYR TOOMEY IRRIG 70ML (MISCELLANEOUS) ×2
SYRINGE TOOMEY IRRIG 70ML (MISCELLANEOUS) ×2 IMPLANT
SYS BAG RETRIEVAL 10MM (BASKET)
SYSTEM BAG RETRIEVAL 10MM (BASKET) IMPLANT
TRAP FLUID SMOKE EVACUATOR (MISCELLANEOUS) ×2 IMPLANT
TRAY FOLEY SLVR 16FR LF STAT (SET/KITS/TRAYS/PACK) ×2 IMPLANT
TROCAR Z-THREAD FIOS 5X100MM (TROCAR) ×2 IMPLANT
TUBING EVAC SMOKE HEATED PNEUM (TUBING) ×2 IMPLANT
WATER STERILE IRR 500ML POUR (IV SOLUTION) ×2 IMPLANT

## 2023-03-26 NOTE — Anesthesia Procedure Notes (Signed)
Procedure Name: Intubation Date/Time: 03/26/2023 11:20 AM  Performed by: Elisabeth Pigeon, CRNAPre-anesthesia Checklist: Patient identified, Patient being monitored, Timeout performed, Emergency Drugs available and Suction available Patient Re-evaluated:Patient Re-evaluated prior to induction Oxygen Delivery Method: Circle system utilized Preoxygenation: Pre-oxygenation with 100% oxygen Induction Type: IV induction Ventilation: Mask ventilation without difficulty Laryngoscope Size: Mac, 3 and Glidescope Grade View: Grade I Tube type: Oral Tube size: 7.0 mm Number of attempts: 1 Airway Equipment and Method: Stylet Placement Confirmation: ETT inserted through vocal cords under direct vision, positive ETCO2 and breath sounds checked- equal and bilateral Secured at: 21 cm Tube secured with: Tape Dental Injury: Teeth and Oropharynx as per pre-operative assessment

## 2023-03-26 NOTE — Interval H&P Note (Signed)
History and Physical Interval Note:  03/26/2023 10:33 AM  Emma Fowler  has presented today for surgery, with the diagnosis of Paraesophageal Hernia, K44.9.  The various methods of treatment have been discussed with the patient and family. After consideration of risks, benefits and other options for treatment, the patient has consented to  Procedure(s): XI ROBOTIC ASSISTED PARAESOPHAGEAL HERNIA REPAIR (N/A) as a surgical intervention.  The patient's history has been reviewed, patient examined, no change in status, stable for surgery.  I have reviewed the patient's chart and labs.  Questions were answered to the patient's satisfaction.     Elara Cocke F Tameisha Covell

## 2023-03-26 NOTE — Progress Notes (Signed)
Updated family at 75 and 1345. Patient is doing well, resting in bed. Bair hugger on low setting. VSS.

## 2023-03-26 NOTE — Progress Notes (Signed)
Anticoagulation monitoring(Lovenox):  38 yo female ordered Lovenox 40 mg Q24h    Filed Weights   03/26/23 0947  Weight: 84.4 kg (186 lb)   BMI 32   Lab Results  Component Value Date   CREATININE 0.66 10/16/2020   CREATININE 0.60 03/13/2020   CREATININE 0.65 09/21/2019   CrCl cannot be calculated (Patient's most recent lab result is older than the maximum 21 days allowed.). Hemoglobin & Hematocrit     Component Value Date/Time   HGB 11.7 11/17/2022 1510   HCT 37.8 11/17/2022 1510     Per Protocol for Patient with estCrcl > 30 ml/min and BMI > 30, will transition to Lovenox 42.5 mg Q24h.

## 2023-03-26 NOTE — Transfer of Care (Signed)
Immediate Anesthesia Transfer of Care Note  Patient: Emma Fowler  Procedure(s) Performed: XI ROBOTIC ASSISTED PARAESOPHAGEAL HERNIA REPAIR INSERTION OF MESH  Patient Location: PACU  Anesthesia Type:General  Level of Consciousness: sedated  Airway & Oxygen Therapy: Patient Spontanous Breathing and Patient connected to face mask oxygen  Post-op Assessment: Report given to RN and Post -op Vital signs reviewed and stable  Post vital signs: Reviewed and stable  Last Vitals:  Vitals Value Taken Time  BP 113/63 03/26/23 1415  Temp    Pulse 86 03/26/23 1416  Resp 17 03/26/23 1416  SpO2 100 % 03/26/23 1416  Vitals shown include unfiled device data.  Last Pain:  Vitals:   03/26/23 0947  TempSrc: Temporal  PainSc: 0-No pain         Complications: No notable events documented.

## 2023-03-26 NOTE — Anesthesia Preprocedure Evaluation (Addendum)
Anesthesia Evaluation  Patient identified by MRN, date of birth, ID band Patient awake    Reviewed: Allergy & Precautions, NPO status , Patient's Chart, lab work & pertinent test results  History of Anesthesia Complications Negative for: history of anesthetic complications  Airway Mallampati: I   Neck ROM: Full    Dental  (+) Missing, Chipped   Pulmonary former smoker (quit 6 months ago; current vaping)   Pulmonary exam normal breath sounds clear to auscultation       Cardiovascular Exercise Tolerance: Good negative cardio ROS Normal cardiovascular exam Rhythm:Regular Rate:Normal     Neuro/Psych  PSYCHIATRIC DISORDERS  Depression    Opioid use disorder on Suboxone, last dose 03/25/23    GI/Hepatic hiatal hernia,GERD  ,,  Endo/Other  Obesity   Renal/GU negative Renal ROS     Musculoskeletal   Abdominal   Peds  Hematology  (+) Blood dyscrasia, anemia   Anesthesia Other Findings   Reproductive/Obstetrics                             Anesthesia Physical Anesthesia Plan  ASA: 2  Anesthesia Plan: General   Post-op Pain Management:    Induction: Intravenous  PONV Risk Score and Plan: 3 and Ondansetron, Dexamethasone and Treatment may vary due to age or medical condition  Airway Management Planned: Oral ETT  Additional Equipment:   Intra-op Plan:   Post-operative Plan: Extubation in OR  Informed Consent: I have reviewed the patients History and Physical, chart, labs and discussed the procedure including the risks, benefits and alternatives for the proposed anesthesia with the patient or authorized representative who has indicated his/her understanding and acceptance.     Dental advisory given  Plan Discussed with: CRNA  Anesthesia Plan Comments: (Patient consented for risks of anesthesia including but not limited to:  - adverse reactions to medications - damage to eyes,  teeth, lips or other oral mucosa - nerve damage due to positioning  - sore throat or hoarseness - damage to heart, brain, nerves, lungs, other parts of body or loss of life  Informed patient about role of CRNA in peri- and intra-operative care.  Patient voiced understanding.)       Anesthesia Quick Evaluation

## 2023-03-26 NOTE — Op Note (Signed)
Robotic assisted laparoscopic repair of  paraesophageal  hernia with Bio-A Mesh and Nissen fundoplication    Pre-operative Diagnosis: GERD, hiatal hernia   Post-operative Diagnosis: same   Procedure:  Robotic assisted laparoscopic repair of  paraesophageal  hernia with Bio-A Mesh and Nissen fundoplication    Surgeon: Sterling Big, MD FACS   Assistant: .Gaston Islam, RNFA Required due to the complexity of the case the need for exposure and lack of first assist.   Anesthesia: Gen. with endotracheal tube   Findings: Type III paraesophageal hernia  with 1/2 of the stomach within the mediastinum. Very weak tissues and fascia as well as debilitated crus. Loose wrap 360 degree over 50 FR Bougie   Estimated Blood Loss: 10cc       Specimens: hiatal hernia sac           Complications: none     Procedure Details  The patient was seen again in the Holding Room. The benefits, complications, treatment options, and expected outcomes were discussed with the patient. The risks of bleeding, infection, recurrence of symptoms, failure to resolve symptoms,  esophageal damage, Dysphagia, bowel injury, any of which could require further surgery were reviewed with the patient. The likelihood of improving the patient's symptoms with return to their baseline status is good.  The patient and/or family concurred with the proposed plan, giving informed consent.  The patient was taken to Operating Room, identified  and the procedure verified.  A Time Out was held and the above information confirmed.   Prior to the induction of general anesthesia, antibiotic prophylaxis was administered. VTE prophylaxis was in place. General endotracheal anesthesia was then administered and tolerated well. After the induction, the abdomen was prepped with Chloraprep and draped in the sterile fashion. The patient was positioned in the supine position. Supraumbilical incision created a Cut down technique was used to enter the  abdominal cavity and a Hasson trochar was placed via hernia defect after two vicryl stitches were anchored to the fascia. Pneumoperitoneum was then created with CO2 and tolerated well without any adverse changes in the patient's vital signs.  Three 8-mm ports were placed under direct vision. All skin incisions  were infiltrated with a local anesthetic agent before making the incision and placing the trocars. An additional 5 mm regular laparoscopic port was placed to assist with retraction and exposure.    The patient was positioned  in reverse Trendelenburg, robot was brought to the surgical field and docked in the standard fashion.  We made sure all the instrumentation was kept indirect view at all times and that there were no collision between the arms. I scrubbed out and went to the console.   I used a robotic arm to retract the liver, the vessel sealer on my right hand and a forced bipolar grasper on my left hand.  There is along the extra 5 mm port allow me ample exposure and the ability to perform meticulous dissection   We Started dividing the lesser omentum via the pars flaccida.  We Were able to dissect the lesser curvature of the stomach and  dissected the fundus free from the right and left crus.  We circumferentially dissected the GE junction.  The hernia sac was also completely reduced and we were able to bring the stomach into the intra-abdominal position.  The hernia sac was excised. Attention then was turned to the greater curvature where the short gastrics were divided with sealer device.  We were able to identify the left  crus and again were able to make sure there was a good circumferential dissection and that the hernia sac was completely excised. In order to create a good mobilization of the lesser curvature the left gastric artery was ligated with XL clipps, this maneuver allowed [place] to mobilize the fundus and to restore appropriate anatomy. Vagal nerves were identified and preserved   We did perform a good dissection within the mediastinum to allow a complete reduction of the sac, gain esophageal length and a to completely allow an intra-abdominal fundoplication. Using two strips of Bio-A as pledgets we approximated the crus with a 2-0 stratafix  suture. A bio-A 10x7 cm mesh was inserted and secured using Vistaseal.  We also asked anesthesia to inject methylene blue via OG and no gastric or esophageal injuries were observed  We Asked anesthesia to place a 50 French bougie and this went easily.  We also observe trajectory of the bougie. 360 degree Nissen fundoplication was created with multiple 2-0 silk sutures and we placed 3 stitches taking some of the esophagus within that bite.  The fundoplication measured approximately 3-1/2 cm and he was floppy. I was very happy with the way the fundoplication laid and the repair of the hernia.  Hernia sac was removed via specimen bag. Inspection of the  upper quadrant was performed. No bleeding, bile  Or esophageal injuries leaks, or bowel injuries were noted. Robotic instruments and robotic arms were undocked in the standard fashion. All the needles were removed under direct visualization.   I scrubbed back in.   Pneumoperitoneum was released.  I pared a ventral defect by placing a manage in an underlay fashion and anchoring the mesh with transfascial sutures in 4 corners under direct visualization.  I then was able to approximate the fascial defect using multiple 0 Ethibond's in the standard fashion.     4-0 subcuticular Monocryl was used to close the skin incisions. Liposomal marcaine was injected to all the incisions sites.   Dermabond was  applied.  The patient was then extubated and brought to the recovery room in stable condition. Sponge, lap, and needle counts were correct at closure and at the conclusion of the case.

## 2023-03-27 ENCOUNTER — Encounter: Payer: Self-pay | Admitting: Surgery

## 2023-03-27 DIAGNOSIS — K449 Diaphragmatic hernia without obstruction or gangrene: Secondary | ICD-10-CM | POA: Diagnosis not present

## 2023-03-27 LAB — CBC
HCT: 33.2 % — ABNORMAL LOW (ref 36.0–46.0)
Hemoglobin: 11.3 g/dL — ABNORMAL LOW (ref 12.0–15.0)
MCH: 29 pg (ref 26.0–34.0)
MCHC: 34 g/dL (ref 30.0–36.0)
MCV: 85.1 fL (ref 80.0–100.0)
Platelets: 308 10*3/uL (ref 150–400)
RBC: 3.9 MIL/uL (ref 3.87–5.11)
RDW: 13.5 % (ref 11.5–15.5)
WBC: 20.5 10*3/uL — ABNORMAL HIGH (ref 4.0–10.5)
nRBC: 0 % (ref 0.0–0.2)

## 2023-03-27 LAB — BASIC METABOLIC PANEL
Anion gap: 7 (ref 5–15)
BUN: 14 mg/dL (ref 6–20)
CO2: 25 mmol/L (ref 22–32)
Calcium: 8.7 mg/dL — ABNORMAL LOW (ref 8.9–10.3)
Chloride: 106 mmol/L (ref 98–111)
Creatinine, Ser: 0.53 mg/dL (ref 0.44–1.00)
GFR, Estimated: >60 mL/min (ref >60)
Glucose, Bld: 100 mg/dL — ABNORMAL HIGH (ref 70–99)
Potassium: 4 mmol/L (ref 3.5–5.1)
Sodium: 138 mmol/L (ref 135–145)

## 2023-03-27 LAB — HIV ANTIBODY (ROUTINE TESTING W REFLEX): HIV Screen 4th Generation wRfx: NONREACTIVE

## 2023-03-27 MED ORDER — KETOROLAC TROMETHAMINE 10 MG PO TABS: 10.0000 mg | ORAL_TABLET | Freq: Four times a day (QID) | ORAL | 0 refills | Status: DC | PRN

## 2023-03-27 MED ORDER — ACETAMINOPHEN 500 MG PO TABS: 1000.0000 mg | ORAL_TABLET | Freq: Four times a day (QID) | ORAL | 0 refills | Status: DC

## 2023-03-27 MED ORDER — PREGABALIN 100 MG PO CAPS: 100.0000 mg | ORAL_CAPSULE | Freq: Three times a day (TID) | ORAL | 0 refills | Status: DC

## 2023-03-27 NOTE — Plan of Care (Signed)

## 2023-03-27 NOTE — Anesthesia Postprocedure Evaluation (Signed)
Anesthesia Post Note  Patient: Emma Fowler  Procedure(s) Performed: XI ROBOTIC ASSISTED PARAESOPHAGEAL HERNIA REPAIR INSERTION OF MESH  Patient location during evaluation: PACU Anesthesia Type: General Level of consciousness: awake and alert, oriented and patient cooperative Pain management: pain level controlled Vital Signs Assessment: post-procedure vital signs reviewed and stable Respiratory status: spontaneous breathing, nonlabored ventilation and respiratory function stable Cardiovascular status: blood pressure returned to baseline and stable Postop Assessment: adequate PO intake Anesthetic complications: no   No notable events documented.   Last Vitals:  Vitals:   03/27/23 0320 03/27/23 0758  BP: 128/73 107/63  Pulse: 77 76  Resp: 16 16  Temp: 37.3 C 37.1 C  SpO2: 97% 97%    Last Pain:  Vitals:   03/27/23 0848  TempSrc:   PainSc: Asleep                 Reed Breech

## 2023-03-27 NOTE — Discharge Summary (Signed)
[facility] SURGICAL DISCHARGE SUMMARY  Patient ID: Emma Fowler MRN: [id number] DOB/AGE: 03-10-1986 38 y.o.  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 female with history of hiatal hernia who presents to [facility] on 01/16 for scheduled repair  Hospital Course: Informed consent was obtained and documented, and patient underwent uneventful Robotic assisted laparoscopic paraesophageal hernia repair & Nissen fundoplication (Dr Everlene Farrier, 03/25/2022).  Post-operatively, patient did well. Expected abdominal soreness. No trouble swallowing. Advancement of patient's diet and ambulation were well-tolerated. The remainder of patient's hospital course was essentially unremarkable, and discharge planning was initiated accordingly with patient safely able to be discharged home with appropriate discharge instructions, pain control, and outpatient follow-up after all of her questions were answered to her expressed satisfaction.   Discharge Condition: Good   Physical Examination:  Constitutional: Well appearing female, NAD Pulmonary: Normal effort, no respiratory distress Gastrointestinal: Soft, incisional soreness, non-distended, no rebound/guarding Skin: Laparoscopic incisions are CDI with dermabond, no erythema    Allergies as of 03/27/2023   No Known Allergies      Medication List     TAKE these medications    acetaminophen 500 MG tablet Commonly known as: TYLENOL Take 2 tablets (1,000 mg total) by mouth every 6 (six) hours.   buprenorphine-naloxone 8-2 mg Subl SL tablet Commonly known as: SUBOXONE Place 1.5 tablets under the tongue  daily.   Fusion Plus Caps Take 1 capsule by mouth daily. What changed: when to take this   ketorolac 10 MG tablet Commonly known as: TORADOL Take 1 tablet (10 mg total) by mouth every 6 (six) hours as needed.   pregabalin 100 MG capsule Commonly known as: LYRICA Take 1 capsule (100 mg total) by mouth 3 (three) times daily for 14 days.   Wegovy 2.4 MG/0.75ML Soaj Generic drug: Semaglutide-Weight Management Inject 2.4 mg into the skin every 7 (seven) days.          Follow-up Information     Leafy Ro, MD. Go on 04/15/2023.   Specialty: General Surgery Why: Go to appointment on 02/05 at 245 PM Contact information: [address] [place] [address] [phone]                  Time spent on discharge management including discussion of hospital course, clinical condition, outpatient instructions, prescriptions, and follow up with the patient and members of the medical team: >30 minutes  -- Lynden Oxford , PA-C [facility]  03/27/2023, 8:57 AM [phone] M-F: 7am - 4pm

## 2023-03-27 NOTE — TOC CM/SW Note (Signed)
Transition of Care [facility]) - Inpatient Brief Assessment   Patient Details  Name: Emma Fowler MRN: [id number] Date of Birth: 02/27/1986  Transition of Care [facility]) CM/SW Contact:    Chapman Fitch, RN Phone Number: 03/27/2023, 9:49 AM   Clinical Narrative:    Transition of Care [facility]) Screening Note   Patient Details  Name: Emma Fowler Date of Birth: August 30, 1985   Transition of Care [facility]) CM/SW Contact:    Chapman Fitch, RN Phone Number: 03/27/2023, 9:49 AM    Transition of Care Department [facility]) has reviewed patient and no TOC needs have been identified at this time. If new patient transition needs arise, please place a TOC consult.   Transition of Care Asessment: Insurance and Status: Insurance coverage has been reviewed Patient has primary care physician: Yes     Prior/Current Home Services: No current home services Social Drivers of Health Review: SDOH reviewed no interventions necessary Readmission risk has been reviewed: Yes Transition of care needs: no transition of care needs at this time

## 2023-03-27 NOTE — Discharge Instructions (Signed)
In addition to included general post-operative instructions,  Diet: Recommend following Nissen diet restrictions, hand out given, for at least 4 weeks.    Activity: No heavy lifting >20 pounds (children, pets, laundry, garbage) or strenuous activity for 4 weeks, but light activity and walking are encouraged. Do not drive or drink alcohol if taking narcotic pain medications or having pain that might distract from driving.  Wound care: 2 days after surgery (01/18), you may shower/get incision wet with soapy water and pat dry (do not rub incisions), but no baths or submerging incision underwater until follow-up.   Medications: Resume all home medications. For mild to moderate pain: acetaminophen (Tylenol) or ibuprofen/naproxen (if no kidney disease). Combining Tylenol with alcohol can substantially increase your risk of causing liver disease. Narcotic pain medications, if prescribed, can be used for severe pain, though may cause nausea, constipation, and drowsiness. Do not combine Tylenol and Percocet (or similar) within a 6 hour period as Percocet (and similar) contain(s) Tylenol. If you do not need the narcotic pain medication, you do not need to fill the prescription.  Call office [phone] / [phone]) at any time if any questions, worsening pain, fevers/chills, bleeding, drainage from incision site, or other concerns.

## 2023-03-30 LAB — SURGICAL PATHOLOGY

## 2023-03-31 ENCOUNTER — Telehealth: Payer: Self-pay | Admitting: Surgery

## 2023-03-31 NOTE — Telephone Encounter (Signed)
Patient had robotic paraesophageal hernia on 03/26/23 with Dr. Everlene Farrier.  She is wanting to know if it is ok to start her weight loss injection, Wegovy.  Please call her. Thank you.

## 2023-03-31 NOTE — Telephone Encounter (Signed)
Patient notified she may restart her Wegovy anytime.

## 2023-04-15 ENCOUNTER — Ambulatory Visit (INDEPENDENT_AMBULATORY_CARE_PROVIDER_SITE_OTHER): Payer: Medicaid Other | Admitting: Surgery

## 2023-04-15 ENCOUNTER — Encounter: Payer: Self-pay | Admitting: Surgery

## 2023-04-15 VITALS — BP 108/74 | HR 94 | Temp 98.4°F | Ht 64.0 in | Wt 187.0 lb

## 2023-04-15 DIAGNOSIS — Z09 Encounter for follow-up examination after completed treatment for conditions other than malignant neoplasm: Secondary | ICD-10-CM

## 2023-04-15 DIAGNOSIS — K449 Diaphragmatic hernia without obstruction or gangrene: Secondary | ICD-10-CM

## 2023-04-15 MED ORDER — CYCLOBENZAPRINE HCL 5 MG PO TABS: 5.0000 mg | ORAL_TABLET | Freq: Three times a day (TID) | ORAL | 0 refills | Status: DC | PRN

## 2023-04-15 NOTE — Patient Instructions (Signed)
 Surgery to Stop Reflux (Toupet Fundoplication): What to Know After After having a surgery called toupet fundoplication to stop reflux, it's common to have trouble swallowing. You may also: Feel pain when you swallow. Have gas and bloating in your belly. Gag. Throw up. Have trouble burping. Follow these instructions at home: Medicines Take your medicines only as told. Ask your health care provider if you can crush the pills that you need to take. Take only liquid medicines if told. Ask your provider if it's safe to drive or use machines while taking your medicine. Caring for your cuts from surgery  Take care of your cut or cuts from surgery as told. Make sure you: Wash your hands with soap and water  for at least 20 seconds before and after you change your bandage. If you can't use soap and water , use hand sanitizer. Change your bandage. Leave stitches or skin glue alone. Leave tape strips alone unless you're told to take them off. You may trim the edges of the tape strips if they curl up. Check the area around your cuts every day for signs of infection. Check for: Redness, swelling, or pain. Fluid or blood. Warmth. Pus or a bad smell. Eating and drinking Eat and drink as told. You may need to have only liquids for 2 weeks. After that, you should have soft foods for 2 weeks. You should: Eat slowly. Take small bites. Chew your food well. Eat or drink while sitting up. Slowly go back to your normal diet. Drink more fluids as told. Activity  If you were given a sedative, do not drive or use machines until you're told it's safe. A sedative can make you sleepy. Rest as told. Get up to take short walks at least every 2 hours during the day. This helps you breathe better and keeps your blood flowing. Ask for help if you feel weak or unsteady. Ask if it's OK for you to lift. Avoid doing things that take a lot of effort. Ask what things are safe for you to do at home. Ask when you  can: Have sex again. Drive. Go back to work or school. General instructions Do not take baths, swim, or use a hot tub until you're told it's OK. Ask if you can shower. Do not smoke, vape, or use nicotine or tobacco. Your provider may give you more instructions. Make sure you know what you can and can't do. Contact a health care provider if: You have a fever. You have any signs of infection. You have pain that doesn't get better with medicine. You have trouble swallowing. You have a cough that won't go away. Get help right away if: You can't eat. You have swelling or pain in your belly. You keep throwing up for longer than 24 hours. You have trouble breathing. These symptoms may be an emergency. Call 911 right away. Do not wait to see if the symptoms will go away. Do not drive yourself to the hospital. This information is not intended to replace advice given to you by your health care provider. Make sure you discuss any questions you have with your health care provider. Document Revised: 09/30/2022 Document Reviewed: 09/30/2022 Elsevier Patient Education  2024 [employer].

## 2023-04-18 NOTE — Progress Notes (Signed)
 Emma Fowler is 2 and half weeks following paraesophageal hernia repair.  She has done well.  No evidence of dysphagia.  No fevers no chills she is tolerating diet and having bowel movement.  She does have some muscle spasms  Physical exam: No acute distress, awake and alert. Abdomen: Soft nontender, incisions healing well without infection  A/P doing very well.  No evidence of reflux no evidence of dysphagia.  No evidence of complications.  She does have some muscle spasms and we will do short course of Flexeril .  Return to clinic in a few months

## 2023-06-22 ENCOUNTER — Ambulatory Visit (INDEPENDENT_AMBULATORY_CARE_PROVIDER_SITE_OTHER): Admitting: Surgery

## 2023-06-22 ENCOUNTER — Encounter: Payer: Self-pay | Admitting: Surgery

## 2023-06-22 VITALS — BP 108/71 | HR 79 | Temp 98.6°F | Ht 64.0 in | Wt 172.6 lb

## 2023-06-22 DIAGNOSIS — Z09 Encounter for follow-up examination after completed treatment for conditions other than malignant neoplasm: Secondary | ICD-10-CM

## 2023-06-22 DIAGNOSIS — K449 Diaphragmatic hernia without obstruction or gangrene: Secondary | ICD-10-CM

## 2023-06-22 NOTE — Patient Instructions (Signed)
Please call the office if you have any questions or concerns. 

## 2023-06-23 NOTE — Progress Notes (Signed)
 Emma Fowler is a 38 year old female 3 months out from paraesophageal hernia repair.  She is doing very well.  No fevers no chills.  No reflux no dysphagia. She is happy w results and continues to loose weight  PE NAD Abd: soft, nt, incisions healed  A/P Doing very well w/o complications She is appreciative F/U prn

## 2023-12-05 ENCOUNTER — Other Ambulatory Visit: Payer: Self-pay

## 2023-12-05 ENCOUNTER — Observation Stay (HOSPITAL_COMMUNITY)

## 2023-12-05 ENCOUNTER — Emergency Department (HOSPITAL_COMMUNITY)

## 2023-12-05 ENCOUNTER — Encounter (HOSPITAL_COMMUNITY): Payer: Self-pay

## 2023-12-05 ENCOUNTER — Inpatient Hospital Stay (HOSPITAL_COMMUNITY)
Admission: EM | Admit: 2023-12-05 | Discharge: 2023-12-07 | DRG: 815 | Disposition: A | Discharge status: Home / Self Care | Attending: Internal Medicine | Admitting: Internal Medicine

## 2023-12-05 DIAGNOSIS — D735 Infarction of spleen: Principal | ICD-10-CM | POA: Diagnosis present

## 2023-12-05 DIAGNOSIS — Z79899 Other long term (current) drug therapy: Secondary | ICD-10-CM

## 2023-12-05 DIAGNOSIS — Z87891 Personal history of nicotine dependence: Secondary | ICD-10-CM

## 2023-12-05 DIAGNOSIS — D509 Iron deficiency anemia, unspecified: Secondary | ICD-10-CM | POA: Diagnosis present

## 2023-12-05 DIAGNOSIS — K5909 Other constipation: Secondary | ICD-10-CM | POA: Diagnosis present

## 2023-12-05 DIAGNOSIS — F1120 Opioid dependence, uncomplicated: Secondary | ICD-10-CM | POA: Diagnosis present

## 2023-12-05 DIAGNOSIS — R1013 Epigastric pain: Principal | ICD-10-CM

## 2023-12-05 LAB — URINALYSIS, W/ REFLEX TO CULTURE (INFECTION SUSPECTED)
Bilirubin Urine: NEGATIVE
Glucose, UA: NEGATIVE mg/dL
Hgb urine dipstick: NEGATIVE
Ketones, ur: NEGATIVE mg/dL
Leukocytes,Ua: NEGATIVE
Nitrite: NEGATIVE
Protein, ur: NEGATIVE mg/dL
Specific Gravity, Urine: 1.019 (ref 1.005–1.030)
pH: 8 (ref 5.0–8.0)

## 2023-12-05 LAB — CBC WITH DIFFERENTIAL/PLATELET
Abs Immature Granulocytes: 0.05 K/uL (ref 0.00–0.07)
Basophils Absolute: 0.1 K/uL (ref 0.0–0.1)
Basophils Relative: 1 %
Eosinophils Absolute: 0.1 K/uL (ref 0.0–0.5)
Eosinophils Relative: 1 %
HCT: 42.3 % (ref 36.0–46.0)
Hemoglobin: 13.8 g/dL (ref 12.0–15.0)
Immature Granulocytes: 1 %
Lymphocytes Relative: 19 %
Lymphs Abs: 1.8 K/uL (ref 0.7–4.0)
MCH: 28.9 pg (ref 26.0–34.0)
MCHC: 32.6 g/dL (ref 30.0–36.0)
MCV: 88.5 fL (ref 80.0–100.0)
Monocytes Absolute: 0.6 K/uL (ref 0.1–1.0)
Monocytes Relative: 6 %
Neutro Abs: 7 K/uL (ref 1.7–7.7)
Neutrophils Relative %: 72 %
Platelets: 499 K/uL — ABNORMAL HIGH (ref 150–400)
RBC: 4.78 MIL/uL (ref 3.87–5.11)
RDW: 12.5 % (ref 11.5–15.5)
WBC: 9.6 K/uL (ref 4.0–10.5)
nRBC: 0 % (ref 0.0–0.2)

## 2023-12-05 LAB — COMPREHENSIVE METABOLIC PANEL WITH GFR
ALT: 22 U/L (ref 0–44)
AST: 27 U/L (ref 15–41)
Albumin: 4.8 g/dL (ref 3.5–5.0)
Alkaline Phosphatase: 67 U/L (ref 38–126)
Anion gap: 16 — ABNORMAL HIGH (ref 5–15)
BUN: 14 mg/dL (ref 6–20)
CO2: 20 mmol/L — ABNORMAL LOW (ref 22–32)
Calcium: 10.5 mg/dL — ABNORMAL HIGH (ref 8.9–10.3)
Chloride: 101 mmol/L (ref 98–111)
Creatinine, Ser: 0.68 mg/dL (ref 0.44–1.00)
GFR, Estimated: >60 mL/min (ref >60)
Glucose, Bld: 130 mg/dL — ABNORMAL HIGH (ref 70–99)
Potassium: 3.9 mmol/L (ref 3.5–5.1)
Sodium: 137 mmol/L (ref 135–145)
Total Bilirubin: 0.5 mg/dL (ref 0.0–1.2)
Total Protein: 7.5 g/dL (ref 6.5–8.1)

## 2023-12-05 LAB — ECHOCARDIOGRAM COMPLETE
Area-P 1/2: 3.21 cm2
Height: 64 in
S' Lateral: 3.3 cm
Weight: 2480 [oz_av]

## 2023-12-05 LAB — PROTIME-INR
INR: 1 (ref 0.8–1.2)
Prothrombin Time: 13.6 s (ref 11.4–15.2)

## 2023-12-05 LAB — TROPONIN T, HIGH SENSITIVITY
Troponin T High Sensitivity: <15 ng/L (ref 0–19)
Troponin T High Sensitivity: <15 ng/L (ref 0–19)

## 2023-12-05 LAB — I-STAT CG4 LACTIC ACID, ED: Lactic Acid, Venous: 1.2 mmol/L (ref 0.5–1.9)

## 2023-12-05 LAB — HCG, SERUM, QUALITATIVE: Preg, Serum: NEGATIVE

## 2023-12-05 LAB — PREGNANCY, URINE: Preg Test, Ur: NEGATIVE

## 2023-12-05 LAB — LIPASE, BLOOD: Lipase: 20 U/L (ref 11–51)

## 2023-12-05 MED ORDER — ONDANSETRON HCL 4 MG/2ML IJ SOLN
4.0000 mg | Freq: Once | INTRAMUSCULAR | Status: AC
  Administered 2023-12-05: 4 mg via INTRAVENOUS
  Filled 2023-12-05: qty 2

## 2023-12-05 MED ORDER — FENTANYL CITRATE PF 50 MCG/ML IJ SOSY
50.0000 ug | PREFILLED_SYRINGE | Freq: Once | INTRAMUSCULAR | Status: AC
  Administered 2023-12-05: 50 ug via INTRAVENOUS
  Filled 2023-12-05: qty 1

## 2023-12-05 MED ORDER — ONDANSETRON HCL 4 MG PO TABS: 4.0000 mg | ORAL_TABLET | Freq: Four times a day (QID) | ORAL | Status: DC | PRN

## 2023-12-05 MED ORDER — ACETAMINOPHEN 650 MG RE SUPP: 650.0000 mg | Freq: Four times a day (QID) | RECTAL | Status: DC | PRN

## 2023-12-05 MED ORDER — MORPHINE SULFATE (PF) 4 MG/ML IV SOLN
4.0000 mg | Freq: Once | INTRAVENOUS | Status: AC
  Administered 2023-12-05: 4 mg via INTRAVENOUS
  Filled 2023-12-05: qty 1

## 2023-12-05 MED ORDER — PANTOPRAZOLE SODIUM 40 MG IV SOLR
40.0000 mg | Freq: Once | INTRAVENOUS | Status: AC
  Administered 2023-12-05: 40 mg via INTRAVENOUS
  Filled 2023-12-05: qty 10

## 2023-12-05 MED ORDER — PANTOPRAZOLE SODIUM 40 MG IV SOLR
40.0000 mg | INTRAVENOUS | Status: DC
  Administered 2023-12-06: 40 mg via INTRAVENOUS
  Filled 2023-12-05: qty 10

## 2023-12-05 MED ORDER — ONDANSETRON HCL 4 MG/2ML IJ SOLN
4.0000 mg | Freq: Four times a day (QID) | INTRAMUSCULAR | Status: DC | PRN
  Administered 2023-12-05 – 2023-12-06 (×4): 4 mg via INTRAVENOUS
  Filled 2023-12-05 (×4): qty 2

## 2023-12-05 MED ORDER — ALBUTEROL SULFATE (2.5 MG/3ML) 0.083% IN NEBU: 2.5000 mg | INHALATION_SOLUTION | RESPIRATORY_TRACT | Status: DC | PRN

## 2023-12-05 MED ORDER — TRAZODONE HCL 50 MG PO TABS: 25.0000 mg | ORAL_TABLET | Freq: Every evening | ORAL | Status: DC | PRN

## 2023-12-05 MED ORDER — DROPERIDOL 2.5 MG/ML IJ SOLN
1.2500 mg | Freq: Once | INTRAMUSCULAR | Status: AC
  Administered 2023-12-05: 1.25 mg via INTRAVENOUS
  Filled 2023-12-05: qty 2

## 2023-12-05 MED ORDER — LACTATED RINGERS IV SOLN: INTRAVENOUS | Status: DC

## 2023-12-05 MED ORDER — IOHEXOL 300 MG/ML  SOLN
100.0000 mL | Freq: Once | INTRAMUSCULAR | Status: AC | PRN
  Administered 2023-12-05: 100 mL via INTRAVENOUS

## 2023-12-05 MED ORDER — SODIUM CHLORIDE 0.9 % IV SOLN
25.0000 mg | Freq: Four times a day (QID) | INTRAVENOUS | Status: DC | PRN
  Administered 2023-12-06: 25 mg via INTRAVENOUS
  Filled 2023-12-05: qty 25

## 2023-12-05 MED ORDER — SODIUM CHLORIDE 0.9 % IV BOLUS
1000.0000 mL | Freq: Once | INTRAVENOUS | Status: AC
  Administered 2023-12-05: 1000 mL via INTRAVENOUS

## 2023-12-05 MED ORDER — HYDROMORPHONE HCL 1 MG/ML IJ SOLN: 0.5000 mg | INTRAMUSCULAR | Status: DC | PRN

## 2023-12-05 MED ORDER — ACETAMINOPHEN 325 MG PO TABS
650.0000 mg | ORAL_TABLET | Freq: Four times a day (QID) | ORAL | Status: DC | PRN
  Administered 2023-12-05 – 2023-12-06 (×2): 650 mg via ORAL
  Filled 2023-12-05 (×2): qty 2

## 2023-12-05 MED ORDER — OXYCODONE HCL 5 MG PO TABS: 5.0000 mg | ORAL_TABLET | ORAL | Status: DC | PRN

## 2023-12-05 NOTE — ED Triage Notes (Addendum)
 Patient has had epigastric abdominal pain since 2am and has vomited since. Has had hernia surgery in January. No diarrhea. Screaming in triage due to pain.

## 2023-12-05 NOTE — ED Provider Notes (Signed)
 [place] EMERGENCY DEPARTMENT AT [facility] Provider Note   CSN: [id number] Arrival date & time: 12/05/23  9263     Patient presents with: Abdominal Pain   Emma Fowler is a 38 y.o. female status post laparoscopic paraesophageal hernia repair & Nissen fundoplication (Dr Jordis, 03/25/2022 presents with complaints of epigastric abdominal pain associate with nausea and vomiting that started earlier this morning.  No diarrhea.  No urinary or vaginal symptoms.  Does not drink alcohol.  Denies cannabis use.  Denies any chest pain or shortness of breath.  Reports she has never had this pain before.    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 Authorizing Provider  buprenorphine -naloxone  (SUBOXONE ) 8-2 mg SUBL SL tablet Place 1.5 tablets under the tongue daily. 11/01/21   [provider]  WEGOVY 2.4 MG/0.75ML SOAJ Inject 2.4 mg into the skin every 7 (seven) days. 03/12/23   [provider]  albuterol  (PROVENTIL  HFA;VENTOLIN  HFA) 108 (90 Base) MCG/ACT inhaler Inhale 4-6 puffs by mouth every 4 hours as needed for wheezing, cough, and/or shortness of breath 11/04/15 04/09/19  Gordan Huxley, MD    Allergies: Patient has no known allergies.    Review of Systems  Gastrointestinal:  Positive for abdominal pain.    Updated Vital Signs BP 111/67 (BP Location: Right Arm)   Pulse 69   Temp 98.3 F (36.8 C) (Oral)   Resp 14   Ht 5' 4 (1.626 m)   Wt 70.3 kg   LMP 09/13/2020 (Exact Date)   SpO2 99%   BMI 26.61 kg/m   Physical Exam Vitals and nursing note reviewed.  Constitutional:       Appearance: She is well-developed.     Comments: Appears uncomfortable  HENT:     Head: Normocephalic and atraumatic.  Eyes:     Conjunctiva/sclera: Conjunctivae normal.  Cardiovascular:     Rate and Rhythm: Normal rate and regular rhythm.     Heart sounds: No murmur heard. Pulmonary:     Effort: Pulmonary effort is normal. No respiratory distress.     Breath sounds: Normal breath sounds.  Abdominal:     Palpations: Abdomen is soft.     Tenderness: There is abdominal tenderness.     Comments: Epigastrium tenderness, abdomen soft nondistended  Musculoskeletal:        General: No swelling.     Cervical back: Neck supple.  Skin:    General: Skin is warm and dry.     Capillary Refill: Capillary refill takes less than 2 seconds.  Neurological:     Mental Status: She is alert.  Psychiatric:        Mood and Affect: Mood normal.     (all labs ordered are listed, but only abnormal results are displayed) Labs Reviewed  COMPREHENSIVE METABOLIC PANEL WITH GFR - Abnormal; Notable for the following components:      Result Value   CO2 20 (*)    Glucose, Bld 130 (*)    Calcium 10.5 (*)    Anion gap 16 (*)  All other components within normal limits  CBC WITH DIFFERENTIAL/PLATELET - Abnormal; Notable for the following components:   Platelets 499 (*)    All other components within normal limits  URINALYSIS, W/ REFLEX TO CULTURE (INFECTION SUSPECTED) - Abnormal; Notable for the following components:   APPearance CLOUDY (*)    Bacteria, UA RARE (*)    All other components within normal limits  CULTURE, BLOOD (ROUTINE X 2)  CULTURE, BLOOD (ROUTINE X 2)  LIPASE, BLOOD  HCG, SERUM, QUALITATIVE  PREGNANCY, URINE  DRUG SCREEN 10 W/CONF, SERUM  I-STAT CG4 LACTIC ACID, ED  TROPONIN T, HIGH SENSITIVITY    EKG: EKG Interpretation Date/Time:  Saturday December 05 2023 09:28:21 EDT Ventricular Rate:  74 PR Interval:  165 QRS Duration:  99 QT Interval:  403 QTC Calculation: 448 R  Axis:   77  Text Interpretation: Age not entered, assumed to be  38 years old for purpose of ECG interpretation Sinus arrhythmia Probable left atrial enlargement Confirmed by Laurice Coy [phone]) on 12/05/2023 10:22:12 AM  Radiology: CT ABDOMEN PELVIS W CONTRAST Result Date: 12/05/2023 CLINICAL DATA:  Epigastric pain abdominal pain.  Vomiting. EXAM: CT ABDOMEN AND PELVIS WITH CONTRAST TECHNIQUE: Multidetector CT imaging of the abdomen and pelvis was performed using the standard protocol following bolus administration of intravenous contrast. RADIATION DOSE REDUCTION: This exam was performed according to the departmental dose-optimization program which includes automated exposure control, adjustment of the mA and/or kV according to patient size and/or use of iterative reconstruction technique. CONTRAST:  OMNIPAQUE  IOHEXOL  300 MG/ML  SOLN COMPARISON:  CT 01/16/2023 FINDINGS: Lower chest: Lung bases are clear. Hepatobiliary: No focal hepatic lesion. Normal gallbladder. No biliary duct dilatation. Common bile duct is normal. Pancreas: Pancreas is normal. No ductal dilatation. No pancreatic inflammation. Spleen: There is a low-density fluid collection extending along the lateral margin of the spleen position between the spleen and the under surface of the diaphragm. This collection measures 6.2 x 1.9 cm in axial dimension (image 17/series 2. There is some loss of the normal splenic parenchyma compared to most recent CT exam 2024. The fluid collection is relatively low-density but not water  density. Adrenals/urinary tract: Adrenal glands and kidneys are normal. The ureters and bladder normal. Stomach/Bowel: Interval repair of the para esophageal hernia. The stomach is now beneath the LEFT hemidiaphragm. Distal esophagus appears normal. small bowel, appendix, and cecum are normal. The colon and rectosigmoid colon are normal. Vascular/Lymphatic: Abdominal aorta is normal caliber. No periportal or  retroperitoneal adenopathy. No pelvic adenopathy. Reproductive: Post hysterectomy.  Adnexa unremarkable Other: No free fluid. Musculoskeletal: No aggressive osseous lesion. IMPRESSION: 1. Low-density fluid collection along the lateral margin of the spleen suggests a chronic or subacute subcapsular splenic hematoma. Splenic infarction less likely. Findings may relate to prior gastric paraesophageal surgery. 2. Interval repair of paraesophageal hernia. Stomach is now beneath the LEFT hemidiaphragm. 3. No evidence of bowel obstruction. Electronically Signed   By: Jackquline Boxer M.D.   On: 12/05/2023 10:09     Procedures   Medications Ordered in the ED  morphine  (PF) 4 MG/ML injection 4 mg (has no administration in time range)  sodium chloride  0.9 % bolus 1,000 mL (1,000 mLs Intravenous New Bag/Given 12/05/23 0810)  ondansetron  (ZOFRAN ) injection 4 mg (4 mg Intravenous Given 12/05/23 0757)  fentaNYL  (SUBLIMAZE ) injection 50 mcg (50 mcg Intravenous Given 12/05/23 0808)  pantoprazole (PROTONIX) injection 40 mg (40 mg Intravenous Given 12/05/23 0808)  morphine  (PF) 4 MG/ML injection 4 mg (4 mg Intravenous  Given 12/05/23 0922)  iohexol  (OMNIPAQUE ) 300 MG/ML solution 100 mL (100 mLs Intravenous Contrast Given 12/05/23 0913)  droperidol (INAPSINE) 2.5 MG/ML injection 1.25 mg (1.25 mg Intravenous Given 12/05/23 1014)    Clinical Course as of 12/05/23 1129  Sat Dec 05, 2023  0829 Patient evaluated for complaints of epigastric abdominal pain associated with nausea and vomiting that started this morning.  Patient is hemodynamically stable.  On exam she appears uncomfortable and restless, she has tenderness to her epigastrium, otherwise her abdomen is soft and nondistended.  Will obtain routine labs and CT abdomen pelvis. [JT]  0844 Comprehensive metabolic panel(!) Anion gap of 16, bicarb of 20, glucose of 130 [JT]  0844 Lipase, blood Within normal limit [JT]  0921 Urinalysis, w/ Reflex to Culture (Infection  Suspected) -Urine, Clean Catch(!) Rare bacteria over 6-10 WBCs, negative nitrites, negative leukocytes, many squamous cells present [JT]  1126 Discussed patient with Dr. Ebbie with general surgery.  Do not feel that any surgical intervention is indicated at this time. [JT]  1126 Discussed patient with hospitalist, Dr. Roxane, agreed for admission.  Overall lab work is reassuring, does have evidence of dehydration.  Her CT is concerning for subacute hematoma versus splenic infarct.  Given the acuity of her symptoms with history of polysubstance abuse, clinical picture is more concerning for splenic infarct.  Patient will be admitted for further observation and evaluation.   [JT]    Clinical Course User Index [JT] Donnajean Lynwood DEL, PA-C                                 Medical Decision Making Amount and/or Complexity of Data Reviewed Labs: ordered. Decision-making details documented in ED Course. Radiology: ordered.  Risk Prescription drug management.   This patient presents to the ED with chief complaint(s) of abdominal pain.  The complaint involves an extensive differential diagnosis and also carries with it a high risk of complications and morbidity.   Pertinent past medical history as listed in HPI  The differential diagnosis includes  Cannabis hyperemesis, appendicitis, cholecystitis, GERD, splenic infarct, hematoma Additional history obtained: Additional history obtained from significant other Records reviewed Care Everywhere/External Records  Disposition:   Patient will be admitted for further workup and management  Social Determinants of Health:   none  This note was dictated with voice recognition software.  Despite best efforts at proofreading, errors may have occurred which can change the documentation meaning.       Final diagnoses:  None    ED Discharge Orders     None          Donnajean Lynwood DEL DEVONNA 12/05/23 1129    Laurice Maude BROCKS,  MD 12/05/23 [phone]

## 2023-12-05 NOTE — H&P (Signed)
 History and Physical  Emma Fowler FMW:[id number] DOB: January 30, 1986 DOA: 12/05/2023  PCP: Emma Tawni KIDD, MD   Chief Complaint: Abdominal pain, vomiting  HPI: Emma Fowler is a 37 y.o. female with medical history significant for polysubstance abuse on Suboxone , history of iron deficiency anemia and paraesophageal hernia status post surgical repair January 2025 being admitted to the hospital with acute onset abdominal pain found to have concern for subacute splenic hematoma versus infarction on CT.  History is provided by the patient as well as her husband who is at the bedside.  States that she was in her usual state of health, when she was woken from sleep with sudden onset of severe stabbing abdominal pain in the epigastrium, with associated vomiting.  She denies any recent illness, any fevers, chills.  She did have some hematemesis overnight.  She has chronic constipation, this is unchanged.  She does have a history of polysubstance abuse but denies any recent IV drug use.  Review of Systems: Please see HPI for pertinent positives and negatives. A complete 10 system review of systems are otherwise negative.  Past Medical History:  Diagnosis Date   Anemia    Bronchitis    Complication of anesthesia    takes longer to wake up   Depression    History of hiatal hernia    Menorrhagia with irregular cycle 02/16/2023   Paraesophageal hernia    Recurrent UTI    Substance abuse (HCC)    narcotics-on Suboxone    Past Surgical History:  Procedure Laterality Date   BILATERAL SALPINGECTOMY Bilateral 10/22/2020   Procedure: BILATERAL SALPINGECTOMY;  Surgeon: Lovetta Debby PARAS, MD;  Location: [facility];  Service: Gynecology;  Laterality: Bilateral;   COLONOSCOPY WITH PROPOFOL  N/A 12/02/2022   Procedure: COLONOSCOPY WITH PROPOFOL ;  Surgeon: Unk Corinn Skiff, MD;  Location: [facility];  Service: Endoscopy;  Laterality: N/A;   DILATION AND CURETTAGE OF UTERUS      ESOPHAGOGASTRODUODENOSCOPY (EGD) WITH PROPOFOL  N/A 12/02/2022   Procedure: ESOPHAGOGASTRODUODENOSCOPY (EGD) WITH PROPOFOL ;  Surgeon: Unk Corinn Skiff, MD;  Location: [facility];  Service: Endoscopy;  Laterality: N/A;   INSERTION OF MESH  03/26/2023   Procedure: INSERTION OF MESH;  Surgeon: Jordis Laneta FALCON, MD;  Location: [facility];  Service: General;;   TONSILLECTOMY     TUBAL LIGATION     VAGINAL HYSTERECTOMY N/A 10/22/2020   Procedure: HYSTERECTOMY VAGINAL;  Surgeon: Lovetta Debby PARAS, MD;  Location: [facility];  Service: Gynecology;  Laterality: N/A;   XI ROBOTIC ASSISTED PARAESOPHAGEAL HERNIA REPAIR N/A 03/26/2023   Procedure: XI ROBOTIC ASSISTED PARAESOPHAGEAL HERNIA REPAIR;  Surgeon: Jordis Laneta FALCON, MD;  Location: [facility];  Service: General;  Laterality: N/A;   Social History:  reports that she has quit smoking. Her smoking use included cigarettes. She has never used smokeless tobacco. She reports current alcohol use. She reports that she does not currently use drugs after having used the following drugs: Other-see comments.  No Known Allergies  Family History  Problem Relation Age of Onset   ADD / ADHD Son      Prior to Admission medications   Medication Sig Start Date End Date Taking? Authorizing Provider  buprenorphine -naloxone  (SUBOXONE ) 8-2 mg SUBL SL tablet Place 1.5 tablets under the tongue daily. 11/01/21   [provider]  WEGOVY 2.4 MG/0.75ML SOAJ Inject 2.4 mg into the skin every 7 (seven) days. 03/12/23   [provider]  albuterol  (PROVENTIL  HFA;VENTOLIN  HFA) 108 (90 Base) MCG/ACT inhaler Inhale 4-6  puffs by mouth every 4 hours as needed for wheezing, cough, and/or shortness of breath 11/04/15 04/09/19  Gordan Huxley, MD    Physical Exam: BP 111/67 (BP Location: Right Arm)   Pulse 69   Temp 98.3 F (36.8 C) (Oral)   Resp 14   Ht 5' 4 (1.626 m)   Wt 70.3 kg   LMP 09/13/2020 (Exact Date)   SpO2 99%   BMI 26.61 kg/m  General:  Alert,  oriented, calm, in no acute distress  Eyes: EOMI, clear conjuctivae, white sclerea Neck: supple, no masses, trachea mildline  Cardiovascular: Regular rhythm, heart rate dips into the mid 40s as patient falls asleep, no murmurs or rubs, no peripheral edema  Respiratory: clear to auscultation bilaterally, no wheezes, no crackles  Abdomen: soft, minimally tender in the epigastrium, no guarding or rebound, nondistended, normal bowel tones heard  Skin: dry, no rashes  Musculoskeletal: no joint effusions, normal range of motion  Psychiatric: appropriate affect, normal speech  Neurologic: extraocular muscles intact, clear speech, moving all extremities with intact sensorium         Labs on Admission:  Basic Metabolic Panel: Recent Labs  Lab 12/05/23 0801  NA 137  K 3.9  CL 101  CO2 20*  GLUCOSE 130*  BUN 14  CREATININE 0.68  CALCIUM 10.5*   Liver Function Tests: Recent Labs  Lab 12/05/23 0801  AST 27  ALT 22  ALKPHOS 67  BILITOT 0.5  PROT 7.5  ALBUMIN 4.8   Recent Labs  Lab 12/05/23 0801  LIPASE 20   No results for input(s): AMMONIA in the last 168 hours. CBC: Recent Labs  Lab 12/05/23 0801  WBC 9.6  NEUTROABS 7.0  HGB 13.8  HCT 42.3  MCV 88.5  PLT 499*   Cardiac Enzymes: No results for input(s): CKTOTAL, CKMB, CKMBINDEX, TROPONINI in the last 168 hours. BNP (last 3 results) No results for input(s): BNP in the last 8760 hours.  ProBNP (last 3 results) No results for input(s): PROBNP in the last 8760 hours.  CBG: No results for input(s): GLUCAP in the last 168 hours.  Radiological Exams on Admission: CT ABDOMEN PELVIS W CONTRAST Result Date: 12/05/2023 CLINICAL DATA:  Epigastric pain abdominal pain.  Vomiting. EXAM: CT ABDOMEN AND PELVIS WITH CONTRAST TECHNIQUE: Multidetector CT imaging of the abdomen and pelvis was performed using the standard protocol following bolus administration of intravenous contrast. RADIATION DOSE REDUCTION: This  exam was performed according to the departmental dose-optimization program which includes automated exposure control, adjustment of the mA and/or kV according to patient size and/or use of iterative reconstruction technique. CONTRAST:  OMNIPAQUE  IOHEXOL  300 MG/ML  SOLN COMPARISON:  CT 01/16/2023 FINDINGS: Lower chest: Lung bases are clear. Hepatobiliary: No focal hepatic lesion. Normal gallbladder. No biliary duct dilatation. Common bile duct is normal. Pancreas: Pancreas is normal. No ductal dilatation. No pancreatic inflammation. Spleen: There is a low-density fluid collection extending along the lateral margin of the spleen position between the spleen and the under surface of the diaphragm. This collection measures 6.2 x 1.9 cm in axial dimension (image 17/series 2. There is some loss of the normal splenic parenchyma compared to most recent CT exam 2024. The fluid collection is relatively low-density but not water  density. Adrenals/urinary tract: Adrenal glands and kidneys are normal. The ureters and bladder normal. Stomach/Bowel: Interval repair of the para esophageal hernia. The stomach is now beneath the LEFT hemidiaphragm. Distal esophagus appears normal. small bowel, appendix, and cecum are normal. The colon and  rectosigmoid colon are normal. Vascular/Lymphatic: Abdominal aorta is normal caliber. No periportal or retroperitoneal adenopathy. No pelvic adenopathy. Reproductive: Post hysterectomy.  Adnexa unremarkable Other: No free fluid. Musculoskeletal: No aggressive osseous lesion. IMPRESSION: 1. Low-density fluid collection along the lateral margin of the spleen suggests a chronic or subacute subcapsular splenic hematoma. Splenic infarction less likely. Findings may relate to prior gastric paraesophageal surgery. 2. Interval repair of paraesophageal hernia. Stomach is now beneath the LEFT hemidiaphragm. 3. No evidence of bowel obstruction. Electronically Signed   By: Jackquline Boxer M.D.   On:  12/05/2023 10:09   Assessment/Plan Emma Fowler is a 38 y.o. female with medical history significant for polysubstance abuse on Suboxone , history of iron deficiency anemia and paraesophageal hernia status post surgical repair January 2025 being admitted to the hospital with acute onset abdominal pain found to have concern for subacute splenic hematoma versus infarction on CT.   Abdominal pain and vomiting-possibly due to splenic hematoma versus infarction, though patient does not seem to have risk factors for either.  Suspect this is a subacute infarction given acute onset of symptoms.  Denies recent infectious symptoms, or IV drug use.  Denies any recent trauma. -Observation admission -Pain and nausea control as needed -Will monitor on telemetry, obtain echo to evaluate for embolic source -Obtain blood cultures to rule out bacteremia though not highly suspected -Check INR, avoid blood thinners for the time being  History of narcotic dependence -Check urine drug screen -Continue Suboxone  once dosing is confirmed  DVT prophylaxis: SCDs    Code Status: Full Code  Consults called: Discussed with Dr. Ebbie, nothing surgical.  Formal surgical consult not requested.  Admission status: Observation  Time spent: 56 minutes  Alexsandro Salek CHRISTELLA Gail MD [facility] Pager [phone]  If 7PM-7AM, please contact night-coverage [email or web address] Password [email or web address]  12/05/2023, 12:09 PM

## 2023-12-05 NOTE — Significant Event (Signed)
       CROSS COVER NOTE  NAME: Emma Fowler MRN: [id number] DOB : September 14, 1985 ATTENDING PHYSICIAN: Zella Katha HERO, MD    Date of Service   12/05/2023   HPI/Events of Note   Nurse reports patient remains with N & V, Unable to keep fluids down  Interventions   Assessment/Plan:    12/05/2023    9:53 PM 12/05/2023    6:33 PM 12/05/2023   12:58 PM  Vitals with BMI  Systolic 133 136 871  Diastolic 74 72 86  Pulse 74 79 68   TEMP                    100.1 Pulse 107 at 2200 LR at 100 ml/h Phenergan  added        [name] Cone NP [facility] Cross Cover 7pm-7am - check amion for availability Pager [phone]

## 2023-12-05 NOTE — Plan of Care (Signed)

## 2023-12-06 DIAGNOSIS — Z87891 Personal history of nicotine dependence: Secondary | ICD-10-CM | POA: Diagnosis not present

## 2023-12-06 DIAGNOSIS — D509 Iron deficiency anemia, unspecified: Secondary | ICD-10-CM | POA: Diagnosis present

## 2023-12-06 DIAGNOSIS — Z79899 Other long term (current) drug therapy: Secondary | ICD-10-CM | POA: Diagnosis not present

## 2023-12-06 DIAGNOSIS — R1013 Epigastric pain: Secondary | ICD-10-CM | POA: Diagnosis present

## 2023-12-06 DIAGNOSIS — D735 Infarction of spleen: Secondary | ICD-10-CM | POA: Diagnosis present

## 2023-12-06 DIAGNOSIS — K5909 Other constipation: Secondary | ICD-10-CM | POA: Diagnosis present

## 2023-12-06 DIAGNOSIS — F1120 Opioid dependence, uncomplicated: Secondary | ICD-10-CM | POA: Diagnosis present

## 2023-12-06 LAB — BASIC METABOLIC PANEL WITH GFR
Anion gap: 11 (ref 5–15)
BUN: 12 mg/dL (ref 6–20)
CO2: 22 mmol/L (ref 22–32)
Calcium: 9.5 mg/dL (ref 8.9–10.3)
Chloride: 101 mmol/L (ref 98–111)
Creatinine, Ser: 0.57 mg/dL (ref 0.44–1.00)
GFR, Estimated: >60 mL/min (ref >60)
Glucose, Bld: 103 mg/dL — ABNORMAL HIGH (ref 70–99)
Potassium: 4.1 mmol/L (ref 3.5–5.1)
Sodium: 134 mmol/L — ABNORMAL LOW (ref 135–145)

## 2023-12-06 LAB — CBC
HCT: 35.8 % — ABNORMAL LOW (ref 36.0–46.0)
Hemoglobin: 11.5 g/dL — ABNORMAL LOW (ref 12.0–15.0)
MCH: 28.8 pg (ref 26.0–34.0)
MCHC: 32.1 g/dL (ref 30.0–36.0)
MCV: 89.7 fL (ref 80.0–100.0)
Platelets: 411 K/uL — ABNORMAL HIGH (ref 150–400)
RBC: 3.99 MIL/uL (ref 3.87–5.11)
RDW: 12.8 % (ref 11.5–15.5)
WBC: 14 K/uL — ABNORMAL HIGH (ref 4.0–10.5)
nRBC: 0 % (ref 0.0–0.2)

## 2023-12-06 MED ORDER — LACTATED RINGERS IV SOLN: INTRAVENOUS | Status: DC

## 2023-12-06 MED ORDER — PANTOPRAZOLE SODIUM 40 MG PO TBEC
40.0000 mg | DELAYED_RELEASE_TABLET | Freq: Every day | ORAL | Status: DC
Start: 2023-12-07 — End: 2023-12-07
  Administered 2023-12-07: 40 mg via ORAL
  Filled 2023-12-06: qty 1

## 2023-12-06 MED ORDER — BUPRENORPHINE HCL-NALOXONE HCL 8-2 MG SL SUBL
1.5000 | SUBLINGUAL_TABLET | Freq: Every day | SUBLINGUAL | Status: DC
  Administered 2023-12-06 – 2023-12-07 (×2): 1.5 via SUBLINGUAL
  Filled 2023-12-06 (×2): qty 2

## 2023-12-06 NOTE — Plan of Care (Signed)
°  Problem: Education: °Goal: Knowledge of General Education information will improve °Description: Including pain rating scale, medication(s)/side effects and non-pharmacologic comfort measures °Outcome: Progressing °  °Problem: Clinical Measurements: °Goal: Diagnostic test results will improve °Outcome: Progressing °  °Problem: Clinical Measurements: °Goal: Ability to maintain clinical measurements within normal limits will improve °Outcome: Progressing °  °

## 2023-12-06 NOTE — Plan of Care (Signed)
  Problem: Clinical Measurements: Goal: Will remain free from infection 12/06/2023 1558 by Sebastian Boyer, RN Outcome: Progressing 12/06/2023 1558 by Sebastian Boyer, RN Outcome: Progressing

## 2023-12-06 NOTE — Plan of Care (Signed)
  Problem: Activity: Goal: Risk for activity intolerance will decrease Outcome: Adequate for Discharge   Problem: Nutrition: Goal: Adequate nutrition will be maintained Outcome: Progressing

## 2023-12-06 NOTE — Progress Notes (Signed)
 PROGRESS NOTE    Emma Fowler  FMW:[id number] DOB: Sep 13, 1985 DOA: 12/05/2023 PCP: Ricard Tawni KIDD, MD    Brief Narrative:  38 year old with history of polysubstance abuse currently on Suboxone , severe iron-deficiency anemia and paraesophageal hernia status postsurgical repair in January 2025 comes with sudden onset of nausea and abdominal pain.  Denies any preceding symptoms.  Does have chronic constipation.  Admitted due to significant symptoms.  Subjective: Patient seen and examined.  Overnight events noted.  Patient continued to have nausea without vomiting.  Unable to eat.  Using different nausea medications. Assessment & Plan:   Intractable nausea and vomiting, abdominal pain in a patient with paraesophageal hernia repair: Primary cause unknown.  Currently remains hemodynamically stable.  CT scan without complication from surgery, does have some subcapsular splenic collection suspected hematoma.  Unable to tolerate diet. N.p.o., IV fluids, adequate nausea medications.  Mobility.  Protonix.  Will go very gradual on diet.  History of substance abuse currently on Suboxone : Patient denies any active use.  Resume Suboxone .    DVT prophylaxis: SCDs Start: 12/05/23 1208   Code Status: Full code Family Communication: None at the bedside Disposition Plan: Status is: Observation The patient will require care spanning > 2 midnights and should be moved to inpatient because: Not tolerating diet, IV fluids     Consultants:  None  Procedures:  None  Antimicrobials:  None     Objective: Vitals:   12/05/23 2153 12/06/23 0118 12/06/23 0529 12/06/23 0855  BP: 133/74 128/78 105/64 119/77  Pulse: 74 80 79 61  Resp: 18 18 20 16   Temp: 100.1 F (37.8 C) 99.3 F (37.4 C) 98.3 F (36.8 C) 98 F (36.7 C)  TempSrc:      SpO2: 97% 100% 97% 100%  Weight:      Height:        Intake/Output Summary (Last 24 hours) at 12/06/2023 1135 Last data filed at 12/06/2023 0855 Gross  per 24 hour  Intake 960 ml  Output --  Net 960 ml   Filed Weights   12/05/23 0743  Weight: 70.3 kg    Examination:  General exam: Appears slightly anxious.  Not in any distress. Respiratory system: Clear to auscultation. Respiratory effort normal. Cardiovascular system: S1 & S2 heard, RRR. No JVD, murmurs, rubs, gallops or clicks. No pedal edema. Gastrointestinal system: Nontender.  Bowel sound present. Central nervous system: Alert and oriented. No focal neurological deficits. Extremities: Symmetric 5 x 5 power.    Data Reviewed: I have personally reviewed following labs and imaging studies  CBC: Recent Labs  Lab 12/05/23 0801 12/06/23 0422  WBC 9.6 14.0*  NEUTROABS 7.0  --   HGB 13.8 11.5*  HCT 42.3 35.8*  MCV 88.5 89.7  PLT 499* 411*   Basic Metabolic Panel: Recent Labs  Lab 12/05/23 0801 12/06/23 0422  NA 137 134*  K 3.9 4.1  CL 101 101  CO2 20* 22  GLUCOSE 130* 103*  BUN 14 12  CREATININE 0.68 0.57  CALCIUM 10.5* 9.5   GFR: Estimated Creatinine Clearance: 92.6 mL/min (by C-G formula based on SCr of 0.57 mg/dL). Liver Function Tests: Recent Labs  Lab 12/05/23 0801  AST 27  ALT 22  ALKPHOS 67  BILITOT 0.5  PROT 7.5  ALBUMIN 4.8   Recent Labs  Lab 12/05/23 0801  LIPASE 20   No results for input(s): AMMONIA in the last 168 hours. Coagulation Profile: Recent Labs  Lab 12/05/23 1353  INR 1.0   Cardiac Enzymes:  No results for input(s): CKTOTAL, CKMB, CKMBINDEX, TROPONINI in the last 168 hours. BNP (last 3 results) No results for input(s): PROBNP in the last 8760 hours. HbA1C: No results for input(s): HGBA1C in the last 72 hours. CBG: No results for input(s): GLUCAP in the last 168 hours. Lipid Profile: No results for input(s): CHOL, HDL, LDLCALC, TRIG, CHOLHDL, LDLDIRECT in the last 72 hours. Thyroid Function Tests: No results for input(s): TSH, T4TOTAL, FREET4, T3FREE, THYROIDAB in the last 72  hours. Anemia Panel: No results for input(s): VITAMINB12, FOLATE, FERRITIN, TIBC, IRON, RETICCTPCT in the last 72 hours. Sepsis Labs: Recent Labs  Lab 12/05/23 1147  LATICACIDVEN 1.2    Recent Results (from the past 240 hours)  Blood culture (routine x 2)     Status: None (Preliminary result)   Collection Time: 12/05/23  1:52 PM   Specimen: BLOOD RIGHT ARM  Result Value Ref Range Status   Specimen Description   Final    BLOOD RIGHT ARM Performed at [facility], [address] [place], [place] [address]    Special Requests   Final    BOTTLES DRAWN AEROBIC AND ANAEROBIC Blood Culture results may not be optimal due to an inadequate volume of blood received in culture bottles Performed at [facility], [address] [place], [place] [address]    Culture PENDING  Incomplete   Report Status PENDING  Incomplete         Radiology Studies: ECHOCARDIOGRAM COMPLETE Result Date: 12/05/2023    ECHOCARDIOGRAM REPORT   Patient Name:   Emma Fowler Date of Exam: 12/05/2023 Medical Rec #:  [id number]          Height:       64.0 in Accession #:    [id number]         Weight:       155.0 lb Date of Birth:  [birth year]          BSA:          1.756 m Patient Age:    37 years           BP:           128/86 mmHg Patient Gender: F                  HR:           70 bpm. Exam Location:  Inpatient Procedure: 2D Echo, Cardiac Doppler and Color Doppler (Both Spectral and Color            Flow Doppler were utilized during procedure). Indications:    Splenic Infarct D73.5  History:        Patient has no prior history of Echocardiogram examinations.  Sonographer:    Damien Senior RDCS Referring Phys: [id number] MIR M [facility] IMPRESSIONS  1. Left ventricular ejection fraction, by estimation, is 60 to 65%. The left ventricle has normal function. The left ventricle has no regional wall motion abnormalities. Left ventricular diastolic parameters were normal.  2. Right  ventricular systolic function is normal. The right ventricular size is normal.  3. The mitral valve is normal in structure. No evidence of mitral valve regurgitation. No evidence of mitral stenosis.  4. The aortic valve is normal in structure. Aortic valve regurgitation is not visualized. No aortic stenosis is present.  5. The inferior vena cava is dilated in size with <50% respiratory variability, suggesting right atrial pressure of 15 mmHg. FINDINGS  Left Ventricle: Left ventricular ejection fraction,  by estimation, is 60 to 65%. The left ventricle has normal function. The left ventricle has no regional wall motion abnormalities. The left ventricular internal cavity size was normal in size. There is  no left ventricular hypertrophy. Left ventricular diastolic parameters were normal. Right Ventricle: The right ventricular size is normal. No increase in right ventricular wall thickness. Right ventricular systolic function is normal. Left Atrium: Left atrial size was normal in size. Right Atrium: Right atrial size was normal in size. Pericardium: There is no evidence of pericardial effusion. Mitral Valve: The mitral valve is normal in structure. No evidence of mitral valve regurgitation. No evidence of mitral valve stenosis. Tricuspid Valve: The tricuspid valve is normal in structure. Tricuspid valve regurgitation is not demonstrated. No evidence of tricuspid stenosis. Aortic Valve: The aortic valve is normal in structure. Aortic valve regurgitation is not visualized. No aortic stenosis is present. Pulmonic Valve: The pulmonic valve was normal in structure. Pulmonic valve regurgitation is not visualized. No evidence of pulmonic stenosis. Aorta: The aortic root is normal in size and structure. Venous: The inferior vena cava is dilated in size with less than 50% respiratory variability, suggesting right atrial pressure of 15 mmHg. IAS/Shunts: No atrial level shunt detected by color flow Doppler.  LEFT VENTRICLE PLAX 2D  LVIDd:         4.90 cm   Diastology LVIDs:         3.30 cm   LV e' medial:    12.00 cm/s LV PW:         0.70 cm   LV E/e' medial:  7.2 LV IVS:        0.60 cm   LV e' lateral:   14.50 cm/s LVOT diam:     2.00 cm   LV E/e' lateral: 6.0 LV SV:         72 LV SV Index:   41 LVOT Area:     3.14 cm LV IVRT:       134 msec  RIGHT VENTRICLE RV S prime:     12.30 cm/s TAPSE (M-mode): 2.5 cm LEFT ATRIUM             Index        RIGHT ATRIUM           Index LA diam:        3.20 cm 1.82 cm/m   RA Area:     11.60 cm LA Vol (A2C):   44.3 ml 25.23 ml/m  RA Volume:   25.20 ml  14.35 ml/m LA Vol (A4C):   45.9 ml 26.15 ml/m LA Biplane Vol: 45.4 ml 25.86 ml/m  AORTIC VALVE LVOT Vmax:   110.00 cm/s LVOT Vmean:  77.300 cm/s LVOT VTI:    0.230 m  AORTA Ao Root diam: 2.80 cm MITRAL VALVE MV Area (PHT): 3.21 cm    SHUNTS MV Decel Time: 236 msec    Systemic VTI:  0.23 m MV E velocity: 86.50 cm/s  Systemic Diam: 2.00 cm MV A velocity: 63.80 cm/s MV E/A ratio:  1.36 Oneil Parchment MD Electronically signed by Oneil Parchment MD Signature Date/Time: 12/05/2023/2:46:12 PM    Final    CT ABDOMEN PELVIS W CONTRAST Result Date: 12/05/2023 CLINICAL DATA:  Epigastric pain abdominal pain.  Vomiting. EXAM: CT ABDOMEN AND PELVIS WITH CONTRAST TECHNIQUE: Multidetector CT imaging of the abdomen and pelvis was performed using the standard protocol following bolus administration of intravenous contrast. RADIATION DOSE REDUCTION: This exam was performed according to the departmental  dose-optimization program which includes automated exposure control, adjustment of the mA and/or kV according to patient size and/or use of iterative reconstruction technique. CONTRAST:  OMNIPAQUE  IOHEXOL  300 MG/ML  SOLN COMPARISON:  CT 01/16/2023 FINDINGS: Lower chest: Lung bases are clear. Hepatobiliary: No focal hepatic lesion. Normal gallbladder. No biliary duct dilatation. Common bile duct is normal. Pancreas: Pancreas is normal. No ductal dilatation. No pancreatic  inflammation. Spleen: There is a low-density fluid collection extending along the lateral margin of the spleen position between the spleen and the under surface of the diaphragm. This collection measures 6.2 x 1.9 cm in axial dimension (image 17/series 2. There is some loss of the normal splenic parenchyma compared to most recent CT exam 2024. The fluid collection is relatively low-density but not water  density. Adrenals/urinary tract: Adrenal glands and kidneys are normal. The ureters and bladder normal. Stomach/Bowel: Interval repair of the para esophageal hernia. The stomach is now beneath the LEFT hemidiaphragm. Distal esophagus appears normal. small bowel, appendix, and cecum are normal. The colon and rectosigmoid colon are normal. Vascular/Lymphatic: Abdominal aorta is normal caliber. No periportal or retroperitoneal adenopathy. No pelvic adenopathy. Reproductive: Post hysterectomy.  Adnexa unremarkable Other: No free fluid. Musculoskeletal: No aggressive osseous lesion. IMPRESSION: 1. Low-density fluid collection along the lateral margin of the spleen suggests a chronic or subacute subcapsular splenic hematoma. Splenic infarction less likely. Findings may relate to prior gastric paraesophageal surgery. 2. Interval repair of paraesophageal hernia. Stomach is now beneath the LEFT hemidiaphragm. 3. No evidence of bowel obstruction. Electronically Signed   By: Jackquline Boxer M.D.   On: 12/05/2023 10:09        Scheduled Meds:  buprenorphine -naloxone   1.5 tablet Sublingual Daily   [START ON 12/07/2023] pantoprazole  40 mg Oral Daily   Continuous Infusions:  lactated ringers      promethazine  (PHENERGAN ) injection (IM or IVPB) 25 mg (12/06/23 0938)     LOS: 0 days    Time spent: 51 minutes    Renato Applebaum, MD [facility]

## 2023-12-07 DIAGNOSIS — D735 Infarction of spleen: Secondary | ICD-10-CM | POA: Diagnosis not present

## 2023-12-07 LAB — CBC WITH DIFFERENTIAL/PLATELET
Abs Immature Granulocytes: 0.02 K/uL (ref 0.00–0.07)
Basophils Absolute: 0.1 K/uL (ref 0.0–0.1)
Basophils Relative: 1 %
Eosinophils Absolute: 0.1 K/uL (ref 0.0–0.5)
Eosinophils Relative: 1 %
HCT: 32.6 % — ABNORMAL LOW (ref 36.0–46.0)
Hemoglobin: 10.2 g/dL — ABNORMAL LOW (ref 12.0–15.0)
Immature Granulocytes: 0 %
Lymphocytes Relative: 41 %
Lymphs Abs: 3.7 K/uL (ref 0.7–4.0)
MCH: 28.3 pg (ref 26.0–34.0)
MCHC: 31.3 g/dL (ref 30.0–36.0)
MCV: 90.3 fL (ref 80.0–100.0)
Monocytes Absolute: 0.9 K/uL (ref 0.1–1.0)
Monocytes Relative: 10 %
Neutro Abs: 4.2 K/uL (ref 1.7–7.7)
Neutrophils Relative %: 47 %
Platelets: 341 K/uL (ref 150–400)
RBC: 3.61 MIL/uL — ABNORMAL LOW (ref 3.87–5.11)
RDW: 13 % (ref 11.5–15.5)
WBC: 8.9 K/uL (ref 4.0–10.5)
nRBC: 0 % (ref 0.0–0.2)

## 2023-12-07 LAB — COMPREHENSIVE METABOLIC PANEL WITH GFR
ALT: 17 U/L (ref 0–44)
AST: 16 U/L (ref 15–41)
Albumin: 3.7 g/dL (ref 3.5–5.0)
Alkaline Phosphatase: 42 U/L (ref 38–126)
Anion gap: 11 (ref 5–15)
BUN: 10 mg/dL (ref 6–20)
CO2: 24 mmol/L (ref 22–32)
Calcium: 9.1 mg/dL (ref 8.9–10.3)
Chloride: 103 mmol/L (ref 98–111)
Creatinine, Ser: 0.58 mg/dL (ref 0.44–1.00)
GFR, Estimated: >60 mL/min (ref >60)
Glucose, Bld: 87 mg/dL (ref 70–99)
Potassium: 4 mmol/L (ref 3.5–5.1)
Sodium: 138 mmol/L (ref 135–145)
Total Bilirubin: 0.4 mg/dL (ref 0.0–1.2)
Total Protein: 5.5 g/dL — ABNORMAL LOW (ref 6.5–8.1)

## 2023-12-07 LAB — PHOSPHORUS: Phosphorus: 2.8 mg/dL (ref 2.5–4.6)

## 2023-12-07 LAB — MAGNESIUM: Magnesium: 2.1 mg/dL (ref 1.7–2.4)

## 2023-12-07 MED ORDER — ONDANSETRON 4 MG PO TBDP: 4.0000 mg | ORAL_TABLET | Freq: Three times a day (TID) | ORAL | 0 refills | Status: DC | PRN

## 2023-12-07 NOTE — TOC Transition Note (Signed)
 Transition of Care [facility]) - Discharge Note   Patient Details  Name: Emma Fowler MRN: [id number] Date of Birth: 1985/08/03  Transition of [facility]) CM/SW Contact:  Alfonse JONELLE Rex, RN Phone Number: 12/07/2023, 11:38 AM   Clinical Narrative:  DC to home prior to IP Care Management initial assessment. No TOC consults/needs.            Patient Goals and CMS Choice            Discharge Placement                       Discharge Plan and Services Additional resources added to the After Visit Summary for                                       Social Drivers of Health (SDOH) Interventions SDOH Screenings   Food Insecurity: Patient Declined (12/05/2023)  Housing: Unknown (03/27/2023)  Transportation Needs: No Transportation Needs (03/27/2023)  Utilities: Not At Risk (03/27/2023)  Tobacco Use: Medium Risk (12/05/2023)     Readmission Risk Interventions    12/07/2023   11:38 AM  Readmission Risk Prevention Plan  Post Dischage Appt Complete  Medication Screening Complete  Transportation Screening Complete

## 2023-12-07 NOTE — Discharge Summary (Signed)
 Physician Discharge Summary  Emma Fowler FMW:[id number] DOB: October 04, 1985 DOA: 12/05/2023  PCP: Ricard Tawni KIDD, MD  Admit date: 12/05/2023 Discharge date: 12/07/2023  Admitted From: Home Disposition: Home  Recommendations for Outpatient Follow-up:  Follow up with PCP in 1-2 weeks Please obtain BMP/CBC in one week    Discharge Condition: Stable CODE STATUS: Full code Diet recommendation: Soft diet  Discharge summary: 38 year old with history of polysubstance abuse currently on Suboxone , no active drug use.  She is also on Wegovy.  She had paraesophageal hernia repair in January 2025.  Came to the emergency room with sudden onset of nausea and abdominal pain without any preceding symptoms.  Does have chronic constipation.  Admitted due to significant symptoms.  CT scan without complication.  Treated symptomatically with IV fluids and nausea medications with good clinical response.  Today she is asymptomatic.  Going home with as needed nausea medications.  Discussed about gastroparesis diet.  If persistent symptoms or recurrence, she may have to consider side effects from [facility].  She will follow-up outpatient. There was some CT scan findings of subcapsular fluid outer aspect of the spleen, seen by surgery, did not recommend any surgical   Discharge Diagnoses:  Principal Problem:   Splenic infarct    Discharge Instructions  Discharge Instructions     Diet general   Complete by: As directed    Soft diet   Increase activity slowly   Complete by: As directed       Allergies as of 12/07/2023   No Known Allergies      Medication List     TAKE these medications    buprenorphine -naloxone  8-2 mg Subl SL tablet Commonly known as: SUBOXONE  Place 1.5 tablets under the tongue daily.   ondansetron  4 MG disintegrating tablet Commonly known as: ZOFRAN -ODT Take 1 tablet (4 mg total) by mouth every 8 (eight) hours as needed for nausea or vomiting.   Wegovy 2.4 MG/0.75ML  Soaj SQ injection Generic drug: semaglutide-weight management Inject 2.4 mg into the skin every 7 (seven) days.        No Known Allergies  Consultations: None   Procedures/Studies: ECHOCARDIOGRAM COMPLETE Result Date: 12/05/2023    ECHOCARDIOGRAM REPORT   Patient Name:   Emma Fowler Date of Exam: 12/05/2023 Medical Rec #:  [id number]          Height:       64.0 in Accession #:    [id number]         Weight:       155.0 lb Date of Birth:  1985-10-07          BSA:          1.756 m Patient Age:    37 years           BP:           128/86 mmHg Patient Gender: F                  HR:           70 bpm. Exam Location:  Inpatient Procedure: 2D Echo, Cardiac Doppler and Color Doppler (Both Spectral and Color            Flow Doppler were utilized during procedure). Indications:    Splenic Infarct D73.5  History:        Patient has no prior history of Echocardiogram examinations.  Sonographer:    Damien Senior RDCS Referring Phys: [id number] MIR M [facility] IMPRESSIONS  1. Left ventricular  ejection fraction, by estimation, is 60 to 65%. The left ventricle has normal function. The left ventricle has no regional wall motion abnormalities. Left ventricular diastolic parameters were normal.  2. Right ventricular systolic function is normal. The right ventricular size is normal.  3. The mitral valve is normal in structure. No evidence of mitral valve regurgitation. No evidence of mitral stenosis.  4. The aortic valve is normal in structure. Aortic valve regurgitation is not visualized. No aortic stenosis is present.  5. The inferior vena cava is dilated in size with <50% respiratory variability, suggesting right atrial pressure of 15 mmHg. FINDINGS  Left Ventricle: Left ventricular ejection fraction, by estimation, is 60 to 65%. The left ventricle has normal function. The left ventricle has no regional wall motion abnormalities. The left ventricular internal cavity size was normal in size. There is  no left  ventricular hypertrophy. Left ventricular diastolic parameters were normal. Right Ventricle: The right ventricular size is normal. No increase in right ventricular wall thickness. Right ventricular systolic function is normal. Left Atrium: Left atrial size was normal in size. Right Atrium: Right atrial size was normal in size. Pericardium: There is no evidence of pericardial effusion. Mitral Valve: The mitral valve is normal in structure. No evidence of mitral valve regurgitation. No evidence of mitral valve stenosis. Tricuspid Valve: The tricuspid valve is normal in structure. Tricuspid valve regurgitation is not demonstrated. No evidence of tricuspid stenosis. Aortic Valve: The aortic valve is normal in structure. Aortic valve regurgitation is not visualized. No aortic stenosis is present. Pulmonic Valve: The pulmonic valve was normal in structure. Pulmonic valve regurgitation is not visualized. No evidence of pulmonic stenosis. Aorta: The aortic root is normal in size and structure. Venous: The inferior vena cava is dilated in size with less than 50% respiratory variability, suggesting right atrial pressure of 15 mmHg. IAS/Shunts: No atrial level shunt detected by color flow Doppler.  LEFT VENTRICLE PLAX 2D LVIDd:         4.90 cm   Diastology LVIDs:         3.30 cm   LV e' medial:    12.00 cm/s LV PW:         0.70 cm   LV E/e' medial:  7.2 LV IVS:        0.60 cm   LV e' lateral:   14.50 cm/s LVOT diam:     2.00 cm   LV E/e' lateral: 6.0 LV SV:         72 LV SV Index:   41 LVOT Area:     3.14 cm LV IVRT:       134 msec  RIGHT VENTRICLE RV S prime:     12.30 cm/s TAPSE (M-mode): 2.5 cm LEFT ATRIUM             Index        RIGHT ATRIUM           Index LA diam:        3.20 cm 1.82 cm/m   RA Area:     11.60 cm LA Vol (A2C):   44.3 ml 25.23 ml/m  RA Volume:   25.20 ml  14.35 ml/m LA Vol (A4C):   45.9 ml 26.15 ml/m LA Biplane Vol: 45.4 ml 25.86 ml/m  AORTIC VALVE LVOT Vmax:   110.00 cm/s LVOT Vmean:  77.300 cm/s  LVOT VTI:    0.230 m  AORTA Ao Root diam: 2.80 cm MITRAL VALVE MV Area (PHT): 3.21 cm  SHUNTS MV Decel Time: 236 msec    Systemic VTI:  0.23 m MV E velocity: 86.50 cm/s  Systemic Diam: 2.00 cm MV A velocity: 63.80 cm/s MV E/A ratio:  1.36 Oneil Parchment MD Electronically signed by Oneil Parchment MD Signature Date/Time: 12/05/2023/2:46:12 PM    Final    CT ABDOMEN PELVIS W CONTRAST Result Date: 12/05/2023 CLINICAL DATA:  Epigastric pain abdominal pain.  Vomiting. EXAM: CT ABDOMEN AND PELVIS WITH CONTRAST TECHNIQUE: Multidetector CT imaging of the abdomen and pelvis was performed using the standard protocol following bolus administration of intravenous contrast. RADIATION DOSE REDUCTION: This exam was performed according to the departmental dose-optimization program which includes automated exposure control, adjustment of the mA and/or kV according to patient size and/or use of iterative reconstruction technique. CONTRAST:  OMNIPAQUE  IOHEXOL  300 MG/ML  SOLN COMPARISON:  CT 01/16/2023 FINDINGS: Lower chest: Lung bases are clear. Hepatobiliary: No focal hepatic lesion. Normal gallbladder. No biliary duct dilatation. Common bile duct is normal. Pancreas: Pancreas is normal. No ductal dilatation. No pancreatic inflammation. Spleen: There is a low-density fluid collection extending along the lateral margin of the spleen position between the spleen and the under surface of the diaphragm. This collection measures 6.2 x 1.9 cm in axial dimension (image 17/series 2. There is some loss of the normal splenic parenchyma compared to most recent CT exam 2024. The fluid collection is relatively low-density but not water  density. Adrenals/urinary tract: Adrenal glands and kidneys are normal. The ureters and bladder normal. Stomach/Bowel: Interval repair of the para esophageal hernia. The stomach is now beneath the LEFT hemidiaphragm. Distal esophagus appears normal. small bowel, appendix, and cecum are normal. The colon and  rectosigmoid colon are normal. Vascular/Lymphatic: Abdominal aorta is normal caliber. No periportal or retroperitoneal adenopathy. No pelvic adenopathy. Reproductive: Post hysterectomy.  Adnexa unremarkable Other: No free fluid. Musculoskeletal: No aggressive osseous lesion. IMPRESSION: 1. Low-density fluid collection along the lateral margin of the spleen suggests a chronic or subacute subcapsular splenic hematoma. Splenic infarction less likely. Findings may relate to prior gastric paraesophageal surgery. 2. Interval repair of paraesophageal hernia. Stomach is now beneath the LEFT hemidiaphragm. 3. No evidence of bowel obstruction. Electronically Signed   By: Jackquline Boxer M.D.   On: 12/05/2023 10:09   (Echo, Carotid, EGD, Colonoscopy, ERCP)    Subjective: Patient seen in the morning rounds.  She denies any complaints.  Denies any symptoms today.  Tolerated soft diet.  Eager to go home.   Discharge Exam: Vitals:   12/07/23 0438 12/07/23 1016  BP: 100/61 106/67  Pulse: 60 66  Resp: 18 16  Temp: 98.6 F (37 C) 98.4 F (36.9 C)  SpO2: 97% 100%   Vitals:   12/06/23 2024 12/07/23 0201 12/07/23 0438 12/07/23 1016  BP: 107/76 (!) 96/55 100/61 106/67  Pulse: 68 63 60 66  Resp: 18 18 18 16   Temp: 99.5 F (37.5 C) 99 F (37.2 C) 98.6 F (37 C) 98.4 F (36.9 C)  TempSrc: Oral Oral Oral   SpO2: 100% 98% 97% 100%  Weight:      Height:        General: Pt is alert, awake, not in acute distress Cardiovascular: RRR, S1/S2 +, no rubs, no gallops Respiratory: CTA bilaterally, no wheezing, no rhonchi Abdominal: Soft, NT, ND, bowel sounds + Extremities: no edema, no cyanosis    The results of significant diagnostics from this hospitalization (including imaging, microbiology, ancillary and laboratory) are listed below for reference.     Microbiology: Recent  Results (from the past 240 hours)  Blood culture (routine x 2)     Status: None (Preliminary result)   Collection Time: 12/05/23   1:52 PM   Specimen: BLOOD RIGHT ARM  Result Value Ref Range Status   Specimen Description   Final    BLOOD RIGHT ARM Performed at [facility], [address], [place], [place] [address]    Special Requests   Final    BOTTLES DRAWN AEROBIC AND ANAEROBIC Blood Culture results may not be optimal due to an inadequate volume of blood received in culture bottles Performed at [facility], [address], [place], [place] [address]    Culture   Final    NO GROWTH 1 DAY Performed at [facility], [address], [place], [place] [address]    Report Status PENDING  Incomplete     Labs: BNP (last 3 results) No results for input(s): BNP in the last 8760 hours. Basic Metabolic Panel: Recent Labs  Lab 12/05/23 0801 12/06/23 0422 12/07/23 0436  NA 137 134* 138  K 3.9 4.1 4.0  CL 101 101 103  CO2 20* 22 24  GLUCOSE 130* 103* 87  BUN 14 12 10   CREATININE 0.68 0.57 0.58  CALCIUM 10.5* 9.5 9.1  MG  --   --  2.1  PHOS  --   --  2.8   Liver Function Tests: Recent Labs  Lab 12/05/23 0801 12/07/23 0436  AST 27 16  ALT 22 17  ALKPHOS 67 42  BILITOT 0.5 0.4  PROT 7.5 5.5*  ALBUMIN 4.8 3.7   Recent Labs  Lab 12/05/23 0801  LIPASE 20   No results for input(s): AMMONIA in the last 168 hours. CBC: Recent Labs  Lab 12/05/23 0801 12/06/23 0422 12/07/23 0436  WBC 9.6 14.0* 8.9  NEUTROABS 7.0  --  4.2  HGB 13.8 11.5* 10.2*  HCT 42.3 35.8* 32.6*  MCV 88.5 89.7 90.3  PLT 499* 411* 341   Cardiac Enzymes: No results for input(s): CKTOTAL, CKMB, CKMBINDEX, TROPONINI in the last 168 hours. BNP: Invalid input(s): POCBNP CBG: No results for input(s): GLUCAP in the last 168 hours. D-Dimer No results for input(s): DDIMER in the last 72 hours. Hgb A1c No results for input(s): HGBA1C in the last 72 hours. Lipid Profile No results for input(s): CHOL, HDL, LDLCALC, TRIG, CHOLHDL, LDLDIRECT in the last 72 hours. Thyroid  function studies No results for input(s): TSH, T4TOTAL, T3FREE, THYROIDAB in the last 72 hours.  Invalid input(s): FREET3 Anemia work up No results for input(s): VITAMINB12, FOLATE, FERRITIN, TIBC, IRON, RETICCTPCT in the last 72 hours. Urinalysis    Component Value Date/Time   COLORURINE YELLOW 12/05/2023 0829   APPEARANCEUR CLOUDY (A) 12/05/2023 0829   APPEARANCEUR Cloudy 12/26/2013 1144   LABSPEC 1.019 12/05/2023 0829   LABSPEC 1.025 12/26/2013 1144   PHURINE 8.0 12/05/2023 0829   GLUCOSEU NEGATIVE 12/05/2023 0829   GLUCOSEU Negative 12/26/2013 1144   HGBUR NEGATIVE 12/05/2023 0829   BILIRUBINUR NEGATIVE 12/05/2023 0829   BILIRUBINUR Negative 12/26/2013 1144   KETONESUR NEGATIVE 12/05/2023 0829   PROTEINUR NEGATIVE 12/05/2023 0829   NITRITE NEGATIVE 12/05/2023 0829   LEUKOCYTESUR NEGATIVE 12/05/2023 0829   LEUKOCYTESUR 2+ 12/26/2013 1144   Sepsis Labs Recent Labs  Lab 12/05/23 0801 12/06/23 0422 12/07/23 0436  WBC 9.6 14.0* 8.9   Microbiology Recent Results (from the past 240 hours)  Blood culture (routine x 2)     Status: None (Preliminary result)  Collection Time: 12/05/23  1:52 PM   Specimen: BLOOD RIGHT ARM  Result Value Ref Range Status   Specimen Description   Final    BLOOD RIGHT ARM Performed at [facility], [address] [place], [place] [address]    Special Requests   Final    BOTTLES DRAWN AEROBIC AND ANAEROBIC Blood Culture results may not be optimal due to an inadequate volume of blood received in culture bottles Performed at [facility], [address] [place], [place] [address]    Culture   Final    NO GROWTH 1 DAY Performed at [facility], [address] [place], [place] [address]    Report Status PENDING  Incomplete     Time coordinating discharge: 32 minutes  SIGNED:   Renato Applebaum, MD  [facility] 12/07/2023, 11:01 AM

## 2023-12-10 LAB — CULTURE, BLOOD (ROUTINE X 2)
Culture: NO GROWTH
Special Requests: ADEQUATE

## 2023-12-11 LAB — CULTURE, BLOOD (ROUTINE X 2): Culture: NO GROWTH

## 2023-12-13 LAB — DRUG SCREEN 10 W/CONF, SERUM
Amphetamines, IA: NEGATIVE ng/mL
Barbiturates, IA: NEGATIVE ug/mL
Benzodiazepines, IA: NEGATIVE ng/mL
Cocaine & Metabolite, IA: NEGATIVE ng/mL
Methadone, IA: NEGATIVE ng/mL
Opiates, IA: POSITIVE ng/mL — AB
Oxycodones, IA: NEGATIVE ng/mL
Phencyclidine, IA: NEGATIVE ng/mL
Propoxyphene, IA: NEGATIVE ng/mL
THC(Marijuana) Metabolite, IA: NEGATIVE ng/mL

## 2023-12-13 LAB — OPIATES,MS,WB/SP RFX
6-Acetylmorphine: NEGATIVE
Codeine: NEGATIVE ng/mL
Dihydrocodeine: NEGATIVE ng/mL
Hydrocodone: NEGATIVE ng/mL
Hydromorphone: NEGATIVE ng/mL
Morphine: 6 ng/mL
Opiate Confirmation: POSITIVE

## 2023-12-22 ENCOUNTER — Observation Stay (HOSPITAL_COMMUNITY)
Admission: EM | Admit: 2023-12-22 | Discharge: 2023-12-24 | Disposition: A | Discharge status: Home / Self Care | Attending: Gastroenterology | Admitting: Gastroenterology

## 2023-12-22 ENCOUNTER — Emergency Department (HOSPITAL_COMMUNITY)

## 2023-12-22 ENCOUNTER — Other Ambulatory Visit: Payer: Self-pay

## 2023-12-22 ENCOUNTER — Encounter (HOSPITAL_COMMUNITY): Payer: Self-pay

## 2023-12-22 DIAGNOSIS — R1013 Epigastric pain: Secondary | ICD-10-CM | POA: Insufficient documentation

## 2023-12-22 DIAGNOSIS — R1010 Upper abdominal pain, unspecified: Secondary | ICD-10-CM

## 2023-12-22 DIAGNOSIS — K922 Gastrointestinal hemorrhage, unspecified: Principal | ICD-10-CM | POA: Diagnosis present

## 2023-12-22 DIAGNOSIS — R112 Nausea with vomiting, unspecified: Principal | ICD-10-CM

## 2023-12-22 DIAGNOSIS — K920 Hematemesis: Secondary | ICD-10-CM | POA: Diagnosis not present

## 2023-12-22 DIAGNOSIS — K209 Esophagitis, unspecified without bleeding: Secondary | ICD-10-CM | POA: Insufficient documentation

## 2023-12-22 DIAGNOSIS — K2100 Gastro-esophageal reflux disease with esophagitis, without bleeding: Secondary | ICD-10-CM | POA: Diagnosis not present

## 2023-12-22 DIAGNOSIS — Z9889 Other specified postprocedural states: Secondary | ICD-10-CM | POA: Diagnosis not present

## 2023-12-22 DIAGNOSIS — D509 Iron deficiency anemia, unspecified: Secondary | ICD-10-CM | POA: Diagnosis present

## 2023-12-22 DIAGNOSIS — K3189 Other diseases of stomach and duodenum: Secondary | ICD-10-CM | POA: Diagnosis not present

## 2023-12-22 DIAGNOSIS — D72829 Elevated white blood cell count, unspecified: Secondary | ICD-10-CM | POA: Insufficient documentation

## 2023-12-22 DIAGNOSIS — D62 Acute posthemorrhagic anemia: Secondary | ICD-10-CM | POA: Diagnosis not present

## 2023-12-22 DIAGNOSIS — F17210 Nicotine dependence, cigarettes, uncomplicated: Secondary | ICD-10-CM | POA: Insufficient documentation

## 2023-12-22 DIAGNOSIS — K2970 Gastritis, unspecified, without bleeding: Secondary | ICD-10-CM | POA: Diagnosis not present

## 2023-12-22 LAB — CBC WITH DIFFERENTIAL/PLATELET
Abs Immature Granulocytes: 0.05 K/uL (ref 0.00–0.07)
Basophils Absolute: 0.1 K/uL (ref 0.0–0.1)
Basophils Relative: 1 %
Eosinophils Absolute: 0 K/uL (ref 0.0–0.5)
Eosinophils Relative: 0 %
HCT: 40.8 % (ref 36.0–46.0)
Hemoglobin: 13.4 g/dL (ref 12.0–15.0)
Immature Granulocytes: 0 %
Lymphocytes Relative: 9 %
Lymphs Abs: 1.1 K/uL (ref 0.7–4.0)
MCH: 29.5 pg (ref 26.0–34.0)
MCHC: 32.8 g/dL (ref 30.0–36.0)
MCV: 89.9 fL (ref 80.0–100.0)
Monocytes Absolute: 0.6 K/uL (ref 0.1–1.0)
Monocytes Relative: 5 %
Neutro Abs: 10.3 K/uL — ABNORMAL HIGH (ref 1.7–7.7)
Neutrophils Relative %: 85 %
Platelets: 443 K/uL — ABNORMAL HIGH (ref 150–400)
RBC: 4.54 MIL/uL (ref 3.87–5.11)
RDW: 13 % (ref 11.5–15.5)
WBC: 12.1 K/uL — ABNORMAL HIGH (ref 4.0–10.5)
nRBC: 0 % (ref 0.0–0.2)

## 2023-12-22 LAB — COMPREHENSIVE METABOLIC PANEL WITH GFR
ALT: 26 U/L (ref 0–44)
AST: 26 U/L (ref 15–41)
Albumin: 4.7 g/dL (ref 3.5–5.0)
Alkaline Phosphatase: 73 U/L (ref 38–126)
Anion gap: 13 (ref 5–15)
BUN: 17 mg/dL (ref 6–20)
CO2: 25 mmol/L (ref 22–32)
Calcium: 10.2 mg/dL (ref 8.9–10.3)
Chloride: 102 mmol/L (ref 98–111)
Creatinine, Ser: 0.68 mg/dL (ref 0.44–1.00)
GFR, Estimated: >60 mL/min (ref >60)
Glucose, Bld: 106 mg/dL — ABNORMAL HIGH (ref 70–99)
Potassium: 3.6 mmol/L (ref 3.5–5.1)
Sodium: 140 mmol/L (ref 135–145)
Total Bilirubin: 0.6 mg/dL (ref 0.0–1.2)
Total Protein: 7.6 g/dL (ref 6.5–8.1)

## 2023-12-22 LAB — URINALYSIS, ROUTINE W REFLEX MICROSCOPIC
Bilirubin Urine: NEGATIVE
Glucose, UA: NEGATIVE mg/dL
Hgb urine dipstick: NEGATIVE
Ketones, ur: 5 mg/dL — AB
Leukocytes,Ua: NEGATIVE
Nitrite: NEGATIVE
Protein, ur: 30 mg/dL — AB
Specific Gravity, Urine: 1.027 (ref 1.005–1.030)
pH: 5 (ref 5.0–8.0)

## 2023-12-22 LAB — URINE DRUG SCREEN
Amphetamines: NEGATIVE
Barbiturates: NEGATIVE
Benzodiazepines: NEGATIVE
Cocaine: NEGATIVE
Fentanyl: NEGATIVE
Methadone Scn, Ur: NEGATIVE
Opiates: NEGATIVE
Tetrahydrocannabinol: NEGATIVE

## 2023-12-22 LAB — LIPASE, BLOOD: Lipase: 22 U/L (ref 11–51)

## 2023-12-22 LAB — HCG, SERUM, QUALITATIVE: Preg, Serum: NEGATIVE

## 2023-12-22 MED ORDER — LACTATED RINGERS IV BOLUS
1000.0000 mL | Freq: Once | INTRAVENOUS | Status: AC
  Administered 2023-12-22: 1000 mL via INTRAVENOUS

## 2023-12-22 MED ORDER — SODIUM CHLORIDE 0.9 % IV BOLUS
1000.0000 mL | Freq: Once | INTRAVENOUS | Status: AC
  Administered 2023-12-22: 1000 mL via INTRAVENOUS

## 2023-12-22 MED ORDER — ONDANSETRON HCL 4 MG/2ML IJ SOLN
4.0000 mg | Freq: Once | INTRAMUSCULAR | Status: AC
  Administered 2023-12-22: 4 mg via INTRAVENOUS
  Filled 2023-12-22: qty 2

## 2023-12-22 MED ORDER — ONDANSETRON HCL 4 MG/2ML IJ SOLN
INTRAMUSCULAR | Status: AC
  Filled 2023-12-22: qty 2

## 2023-12-22 MED ORDER — PANTOPRAZOLE SODIUM 40 MG IV SOLR: 40.0000 mg | Freq: Two times a day (BID) | INTRAVENOUS | Status: DC

## 2023-12-22 MED ORDER — DEXTROSE-SODIUM CHLORIDE 5-0.45 % IV SOLN: INTRAVENOUS | Status: AC

## 2023-12-22 MED ORDER — ONDANSETRON HCL 4 MG/2ML IJ SOLN
4.0000 mg | Freq: Four times a day (QID) | INTRAMUSCULAR | Status: DC | PRN
  Administered 2023-12-23: 4 mg via INTRAVENOUS
  Filled 2023-12-22: qty 2

## 2023-12-22 MED ORDER — PANTOPRAZOLE SODIUM 40 MG IV SOLR
80.0000 mg | Freq: Once | INTRAVENOUS | Status: AC
  Administered 2023-12-22: 80 mg via INTRAVENOUS
  Filled 2023-12-22: qty 20

## 2023-12-22 MED ORDER — IOHEXOL 300 MG/ML  SOLN
100.0000 mL | Freq: Once | INTRAMUSCULAR | Status: AC | PRN
  Administered 2023-12-22: 100 mL via INTRAVENOUS

## 2023-12-22 MED ORDER — POLYETHYLENE GLYCOL 3350 17 G PO PACK
17.0000 g | PACK | Freq: Two times a day (BID) | ORAL | Status: DC
  Administered 2023-12-23 (×2): 17 g via ORAL
  Filled 2023-12-22 (×2): qty 1

## 2023-12-22 MED ORDER — DIPHENHYDRAMINE HCL 50 MG/ML IJ SOLN
25.0000 mg | Freq: Once | INTRAMUSCULAR | Status: AC
  Administered 2023-12-22: 25 mg via INTRAVENOUS
  Filled 2023-12-22: qty 1

## 2023-12-22 MED ORDER — ACETAMINOPHEN 650 MG RE SUPP: 650.0000 mg | Freq: Four times a day (QID) | RECTAL | Status: DC | PRN

## 2023-12-22 MED ORDER — ONDANSETRON HCL 4 MG PO TABS: 4.0000 mg | ORAL_TABLET | Freq: Four times a day (QID) | ORAL | Status: DC | PRN

## 2023-12-22 MED ORDER — PROCHLORPERAZINE EDISYLATE 10 MG/2ML IJ SOLN
10.0000 mg | INTRAMUSCULAR | Status: DC | PRN
  Administered 2023-12-23: 10 mg via INTRAVENOUS
  Filled 2023-12-22: qty 2

## 2023-12-22 MED ORDER — BUPRENORPHINE HCL-NALOXONE HCL 8-2 MG SL SUBL
1.5000 | SUBLINGUAL_TABLET | Freq: Once | SUBLINGUAL | Status: AC
  Administered 2023-12-22: 1.5 via SUBLINGUAL
  Filled 2023-12-22: qty 2

## 2023-12-22 MED ORDER — LACTATED RINGERS IV BOLUS
1000.0000 mL | Freq: Once | INTRAVENOUS | Status: AC
Start: 2023-12-22 — End: 2023-12-22
  Administered 2023-12-22: 1000 mL via INTRAVENOUS

## 2023-12-22 MED ORDER — BUPRENORPHINE HCL-NALOXONE HCL 8-2 MG SL SUBL
1.5000 | SUBLINGUAL_TABLET | Freq: Every day | SUBLINGUAL | Status: DC
Start: 2023-12-23 — End: 2023-12-24
  Administered 2023-12-23: 1.5 via SUBLINGUAL
  Filled 2023-12-22 (×2): qty 1.5

## 2023-12-22 MED ORDER — METOCLOPRAMIDE HCL 5 MG/ML IJ SOLN
5.0000 mg | Freq: Once | INTRAMUSCULAR | Status: AC
  Administered 2023-12-22: 5 mg via INTRAVENOUS
  Filled 2023-12-22: qty 2

## 2023-12-22 MED ORDER — KETAMINE HCL 10 MG/ML IJ SOLN
0.3000 mg/kg | Freq: Once | INTRAMUSCULAR | Status: AC
  Administered 2023-12-22: 21 mg via INTRAVENOUS
  Filled 2023-12-22: qty 1

## 2023-12-22 MED ORDER — PANTOPRAZOLE SODIUM 40 MG IV SOLR
40.0000 mg | Freq: Two times a day (BID) | INTRAVENOUS | Status: DC
  Administered 2023-12-22 – 2023-12-23 (×3): 40 mg via INTRAVENOUS
  Filled 2023-12-22 (×3): qty 10

## 2023-12-22 MED ORDER — SODIUM CHLORIDE 0.9 % IV SOLN: INTRAVENOUS | Status: AC

## 2023-12-22 MED ORDER — ACETAMINOPHEN 325 MG PO TABS: 650.0000 mg | ORAL_TABLET | Freq: Four times a day (QID) | ORAL | Status: DC | PRN

## 2023-12-22 NOTE — ED Notes (Addendum)
 Pt's PIV infiltrated in L AC, 20g, upper arm swollen and red, notified Dr.Pfeiffer who stated to elevate the site and ice pack applied, applied. Lactated Ringer bolus was the only IVF going. PIV flushed and had blood return prior to starting fluids, was positional.

## 2023-12-22 NOTE — ED Triage Notes (Signed)
Pt reports with vomiting since Sunday.

## 2023-12-22 NOTE — ED Notes (Signed)
 Pt placed on 4L/M nasal cannula following ketamine  administration. Pt in no acute distress, respirations equal and unlabored, monitoring devices active, care ongoing.

## 2023-12-22 NOTE — ED Provider Notes (Signed)
 [place] EMERGENCY DEPARTMENT AT [facility] Provider Note   CSN: [id number] Arrival date & time: 12/22/23  9079     Patient presents with: Emesis   Emma Fowler is a 38 y.o. female.   HPI Patient reports that she started vomiting after work on Saturday.  She reports she continued to have vomiting several times a day since then.  Patient reports she has a lot of upper abdominal pain.  Aching in quality.  No diarrhea or constipation.  No fevers.  No pain burning urgency with urination.  No cough or shortness of breath.  No fever no sore throat.  Patient reports she has not been taking Wegovy for probably several months.  Denies THC use.  Patient reports she has been compliant with her Suboxone  and does not think she is having any withdrawal from that.    Prior to Admission medications   Medication Sig Start Date End Date Taking? Authorizing Provider  acetaminophen  (TYLENOL ) 500 MG tablet Take 1,000 mg by mouth daily as needed for mild pain (pain score 1-3) or moderate pain (pain score 4-6).   Yes [provider]  buprenorphine -naloxone  (SUBOXONE ) 8-2 mg SUBL SL tablet Place 0.5-1 tablets under the tongue See admin instructions. Take 0.5 tablet by mouth in the morning and take 1 tablet by mouth at bedtime. 11/01/21  Yes [provider]  omeprazole (PRILOSEC) 40 MG capsule Take 1 capsule (40 mg total) by mouth 2 (two) times daily for 28 days, THEN 1 capsule (40 mg total) daily for 28 days. 12/24/23 02/18/24 Yes Pahwani, Ravi, MD  sucralfate (CARAFATE) 1 g tablet Take 1 tablet (1 g total) by mouth 2 (two) times daily for 28 days. 12/24/23 01/21/24 Yes Vernon Ranks, MD  albuterol  (PROVENTIL  HFA;VENTOLIN  HFA) 108 (90 Base) MCG/ACT inhaler Inhale 4-6 puffs by mouth every 4 hours as needed for wheezing, cough, and/or shortness of breath 11/04/15 04/09/19  Gordan Huxley, MD    Allergies: Patient has no known allergies.    Review of Systems  Updated Vital  Signs BP 109/71   Pulse 77   Temp 98.1 F (36.7 C) (Temporal)   Resp 14   Ht 5' 4 (1.626 m)   Wt 71.2 kg   LMP 09/13/2020 (Exact Date)   SpO2 100%   BMI 26.95 kg/m   Physical Exam Constitutional:      Comments: Alert.  Uncomfortable in appearance.  Thin.  HENT:     Mouth/Throat:     Pharynx: Oropharynx is clear.  Eyes:     Extraocular Movements: Extraocular movements intact.  Cardiovascular:     Rate and Rhythm: Normal rate and regular rhythm.  Pulmonary:     Effort: Pulmonary effort is normal.     Breath sounds: Normal breath sounds.  Abdominal:     Comments: Patient endorses discomfort palpation diffusely in the upper abdomen.  No guarding.  Musculoskeletal:        General: No swelling or tenderness. Normal range of motion.     Right lower leg: No edema.     Left lower leg: No edema.  Skin:    General: Skin is warm and dry.  Neurological:     General: No focal deficit present.     (all labs ordered are listed, but only abnormal results are displayed) Labs Reviewed  COMPREHENSIVE METABOLIC PANEL WITH GFR - Abnormal; Notable for the following components:      Result Value   Glucose, Bld 106 (*)    All  other components within normal limits  CBC WITH DIFFERENTIAL/PLATELET - Abnormal; Notable for the following components:   WBC 12.1 (*)    Platelets 443 (*)    Neutro Abs 10.3 (*)    All other components within normal limits  URINALYSIS, ROUTINE W REFLEX MICROSCOPIC - Abnormal; Notable for the following components:   APPearance CLOUDY (*)    Ketones, ur 5 (*)    Protein, ur 30 (*)    Bacteria, UA FEW (*)    All other components within normal limits  CBC - Abnormal; Notable for the following components:   RBC 3.73 (*)    Hemoglobin 11.1 (*)    HCT 34.0 (*)    All other components within normal limits  COMPREHENSIVE METABOLIC PANEL WITH GFR - Abnormal; Notable for the following components:   Total Protein 5.6 (*)    All other components within normal limits   CBC WITH DIFFERENTIAL/PLATELET - Abnormal; Notable for the following components:   WBC 10.8 (*)    All other components within normal limits  CBC WITH DIFFERENTIAL/PLATELET - Abnormal; Notable for the following components:   Hemoglobin 11.1 (*)    HCT 35.3 (*)    All other components within normal limits  LIPASE, BLOOD  URINE DRUG SCREEN  HCG, SERUM, QUALITATIVE  BASIC METABOLIC PANEL WITH GFR  SURGICAL PATHOLOGY    EKG: EKG Interpretation Date/Time:  Tuesday December 22 2023 10:52:31 EDT Ventricular Rate:  57 PR Interval:  132 QRS Duration:  95 QT Interval:  434 QTC Calculation: 423 R Axis:   81  Text Interpretation: Sinus rhythm normal Confirmed by Armenta Canning [phone]) on 12/22/2023 11:06:54 AM  Radiology: No results found.   Procedures   Medications Ordered in the ED  0.9 %  sodium chloride  infusion (0 mLs Intravenous Hold 12/22/23 1721)  dextrose  5 % and 0.45 % NaCl infusion ( Intravenous Infusion Verify 12/23/23 0452)  ondansetron  (ZOFRAN ) 4 MG/2ML injection ( Intravenous Not Given 12/22/23 1715)  lactated ringers  bolus 1,000 mL (0 mLs Intravenous Stopped 12/22/23 1212)  ketamine  (KETALAR ) injection 21 mg (21 mg Intravenous Given 12/22/23 1211)  iohexol  (OMNIPAQUE ) 300 MG/ML solution 100 mL (100 mLs Intravenous Contrast Given 12/22/23 1156)  buprenorphine -naloxone  (SUBOXONE ) 8-2 mg per SL tablet 1.5 tablet (1.5 tablets Sublingual Given 12/22/23 1515)  metoCLOPramide (REGLAN) injection 5 mg (5 mg Intravenous Given 12/22/23 1504)  diphenhydrAMINE  (BENADRYL ) injection 25 mg (25 mg Intravenous Given 12/22/23 1500)  lactated ringers  bolus 1,000 mL (0 mLs Intravenous Stopped 12/22/23 1653)  ondansetron  (ZOFRAN ) injection 4 mg (4 mg Intravenous Given 12/22/23 1708)  sodium chloride  0.9 % bolus 1,000 mL (0 mLs Intravenous Stopped 12/22/23 1900)  pantoprazole (PROTONIX) injection 80 mg (80 mg Intravenous Given 12/22/23 1708)                                     Medical Decision Making Amount and/or Complexity of Data Reviewed Labs: ordered. Radiology: ordered.  Risk Prescription drug management. Decision regarding hospitalization.   Patient had recurrent vomiting.  She also has abdominal pain.  Differential diagnosis includes obstruction\pancreatitis\gastroenteritis\cholecystitis or biliary colic\recurrence of hiatal hernia or strangulation of hiatal hernia.  Possibility of withdrawal from opioid with chronic Suboxone  treatment.  Patient had a paraesophageal hernia repair.  Patient was doing well afterwards but then developed this pain and vomiting.  Concern for possible obstruction or complication.  Will proceed with CT scan.  Patient also takes  Suboxone  she reports she had not run out.  Possibility of withdrawal with vomiting and abdominal pain.  Will continue to monitor closely and rehydrate and treat nausea.  Count 10.1 H&H 11 and 34 hCG negative UDS negative lipase 22 metabolic panel normal with normal LFTs and GFR greater than 60  CT chest abdomen pelvis personally reviewed by myself and interpreted by radiology negative for acute findings.  No remark or observation made regarding complications of paraesophageal hernia repair.  Patient temporarily had some improvement after treatment with Reglan Benadryl  and ketamine .  However, she had recurrence of significant dry heaves and vomiting.  Patient subsequently treated with Zofran  and Suboxone  strip.  Patient was rehydrated with 2 L of lactated Ringer's.  With persistent symptoms despite treatment, will plan for admission.  Consult: [facility] for admission       Final diagnoses:  Intractable vomiting with nausea  Upper abdominal pain    ED Discharge Orders          Ordered    omeprazole (PRILOSEC) 40 MG capsule  Multiple Frequencies        12/24/23 1116    sucralfate (CARAFATE) 1 g tablet  2 times daily        12/24/23 1116               Armenta Canning,  MD 12/27/23 1413

## 2023-12-22 NOTE — ED Notes (Signed)
 ED TO INPATIENT HANDOFF REPORT  Name/Age/Gender Emma Fowler 38 y.o. female  Code Status    Code Status Orders  (From admission, onward)           Start     Ordered   12/22/23 1641  Full code  Continuous       Question:  By:  Answer:  Consent: discussion documented in EHR   12/22/23 1646           Code Status History     Date Active Date Inactive Code Status Order ID Comments User Context   12/05/2023 1209 12/07/2023 1640 Full Code [id number]  Zella Katha HERO, MD ED   03/26/2023 1421 03/27/2023 1623 Full Code [id number]  Jordis Laneta FALCON, MD Inpatient   10/22/2020 0716 10/22/2020 1711 Full Code [id number]  Schermerhorn, Debby PARAS, MD Inpatient       Home/SNF/Other Home  Chief Complaint Acute upper GI bleed [K92.2]  Level of Care/Admitting Diagnosis ED Disposition     ED Disposition  Admit   Condition  --   Comment  Hospital Area: [facility] [[id number]]  Level of Care: Med-Surg [16]  May admit patient to [facility] or [facility] if equivalent level of care is available:: No  Diagnosis: Acute upper GI bleed [640176]  Admitting Physician: FELIZ ORTIZ, ABRAHAM [[id number]]  Attending Physician: FELIZ ORTIZ, ABRAHAM [[id number]]  Certification:: I certify this patient will need inpatient services for at least 2 midnights  Expected Medical Readiness: 12/24/2023          Medical History Past Medical History:  Diagnosis Date   Anemia    Bronchitis    Complication of anesthesia    takes longer to wake up   Depression    History of hiatal hernia    Menorrhagia with irregular cycle 02/16/2023   Paraesophageal hernia    Recurrent UTI    Substance abuse (HCC)    narcotics-on Suboxone     Allergies No Known Allergies  IV Location/Drains/Wounds Patient Lines/Drains/Airways Status     Active Line/Drains/Airways     Name Placement date Placement time Site Days   Peripheral IV 12/22/23 1 Posterior;Right Hand 12/22/23  1116  Hand  less than 1             Labs/Imaging Results for orders placed or performed during the hospital encounter of 12/22/23 (from the past 48 hours)  Comprehensive metabolic panel     Status: Abnormal   Collection Time: 12/22/23  9:23 AM  Result Value Ref Range   Sodium 140 135 - 145 mmol/L   Potassium 3.6 3.5 - 5.1 mmol/L   Chloride 102 98 - 111 mmol/L   CO2 25 22 - 32 mmol/L   Glucose, Bld 106 (H) 70 - 99 mg/dL    Comment: Glucose reference range applies only to samples taken after fasting for at least 8 hours.   BUN 17 6 - 20 mg/dL   Creatinine, Ser 9.31 0.44 - 1.00 mg/dL   Calcium 89.7 8.9 - 89.6 mg/dL   Total Protein 7.6 6.5 - 8.1 g/dL   Albumin 4.7 3.5 - 5.0 g/dL   AST 26 15 - 41 U/L   ALT 26 0 - 44 U/L   Alkaline Phosphatase 73 38 - 126 U/L   Total Bilirubin 0.6 0.0 - 1.2 mg/dL   GFR, Estimated >39 >39 mL/min    Comment: (NOTE) Calculated using the CKD-EPI Creatinine Equation (2021)    Anion gap 13 5 - 15  Comment: Performed at [facility], [address], [place], [place] [address]  Lipase, blood     Status: None   Collection Time: 12/22/23  9:23 AM  Result Value Ref Range   Lipase 22 11 - 51 U/L    Comment: Performed at [facility], [address], [place], [place] [address]  CBC with Diff     Status: Abnormal   Collection Time: 12/22/23  9:23 AM  Result Value Ref Range   WBC 12.1 (H) 4.0 - 10.5 K/uL   RBC 4.54 3.87 - 5.11 MIL/uL   Hemoglobin 13.4 12.0 - 15.0 g/dL   HCT 59.1 63.9 - 53.9 %   MCV 89.9 80.0 - 100.0 fL   MCH 29.5 26.0 - 34.0 pg   MCHC 32.8 30.0 - 36.0 g/dL   RDW 86.9 88.4 - 84.4 %   Platelets 443 (H) 150 - 400 K/uL   nRBC 0.0 0.0 - 0.2 %   Neutrophils Relative % 85 %   Neutro Abs 10.3 (H) 1.7 - 7.7 K/uL   Lymphocytes Relative 9 %   Lymphs Abs 1.1 0.7 - 4.0 K/uL   Monocytes Relative 5 %   Monocytes Absolute 0.6 0.1 - 1.0 K/uL   Eosinophils Relative 0 %   Eosinophils Absolute 0.0 0.0 - 0.5 K/uL   Basophils Relative  1 %   Basophils Absolute 0.1 0.0 - 0.1 K/uL   Immature Granulocytes 0 %   Abs Immature Granulocytes 0.05 0.00 - 0.07 K/uL    Comment: Performed at [facility], [address] [place], [place] [address]  Urinalysis, Routine w reflex microscopic -Urine, Clean Catch     Status: Abnormal   Collection Time: 12/22/23  9:23 AM  Result Value Ref Range   Color, Urine YELLOW YELLOW   APPearance CLOUDY (A) CLEAR   Specific Gravity, Urine 1.027 1.005 - 1.030   pH 5.0 5.0 - 8.0   Glucose, UA NEGATIVE NEGATIVE mg/dL   Hgb urine dipstick NEGATIVE NEGATIVE   Bilirubin Urine NEGATIVE NEGATIVE   Ketones, ur 5 (A) NEGATIVE mg/dL   Protein, ur 30 (A) NEGATIVE mg/dL   Nitrite NEGATIVE NEGATIVE   Leukocytes,Ua NEGATIVE NEGATIVE   RBC / HPF 0-5 0 - 5 RBC/hpf   WBC, UA 0-5 0 - 5 WBC/hpf   Bacteria, UA FEW (A) NONE SEEN   Squamous Epithelial / HPF 21-50 0 - 5 /HPF   Mucus PRESENT    Hyaline Casts, UA PRESENT     Comment: Performed at [facility], [address] [place], [place] [address]  Urine rapid drug screen (hosp performed)     Status: None   Collection Time: 12/22/23  9:23 AM  Result Value Ref Range   Opiates NEGATIVE NEGATIVE   Cocaine NEGATIVE NEGATIVE   Benzodiazepines NEGATIVE NEGATIVE   Amphetamines NEGATIVE NEGATIVE   Tetrahydrocannabinol NEGATIVE NEGATIVE   Barbiturates NEGATIVE NEGATIVE   Methadone Scn, Ur NEGATIVE NEGATIVE   Fentanyl  NEGATIVE NEGATIVE    Comment: (NOTE) Drug screen is for Medical Purposes only. Positive results are preliminary only. If confirmation is needed, notify lab within 5 days.  Drug Class                 Cutoff (ng/mL) Amphetamine and metabolites 1000 Barbiturate and metabolites 200 Benzodiazepine              200 Opiates and metabolites     300 Cocaine and metabolites     300  THC                         50 Fentanyl                     5 Methadone                   300  Trazodone is metabolized in vivo to  several metabolites,  including pharmacologically active m-CPP, which is excreted in the  urine.  Immunoassay screens for amphetamines and MDMA have potential  cross-reactivity with these compounds and may provide false positive  result.  Performed at [facility], [address], [place], [place] [address]    CT CHEST ABDOMEN PELVIS W CONTRAST Result Date: 12/22/2023 EXAM: CT CHEST, ABDOMEN AND PELVIS WITH CONTRAST 12/22/2023 12:08:19 PM TECHNIQUE: CT of the chest, abdomen and pelvis was performed with the administration of 100 mL (iohexol  (OMNIPAQUE ) 300 MG/ML solution 100 mL IOHEXOL  300 MG/ML SOLN) intravenous contrast. Multiplanar reformatted images are provided for review. Automated exposure control, iterative reconstruction, and/or weight based adjustment of the mA/kV was utilized to reduce the radiation dose to as low as reasonably achievable. COMPARISON: 12/05/2023 CLINICAL HISTORY: eval for hiatal herrnia repair obstruction or complication. Per chart: Pt reports with vomiting since Sunday. FINDINGS: CHEST: MEDIASTINUM AND LYMPH NODES: Heart and pericardium are unremarkable. The central airways are clear. No definite hiatal hernia is noted. No mediastinal, hilar or axillary lymphadenopathy. LUNGS AND PLEURA: No focal consolidation or pulmonary edema. No pleural effusion or pneumothorax. ABDOMEN AND PELVIS: LIVER: The liver is unremarkable. GALLBLADDER AND BILE DUCTS: Gallbladder is unremarkable. No biliary ductal dilatation. SPLEEN: Splenic fluid collection is noted consistent with old hematoma or seroma. PANCREAS: No acute abnormality. ADRENAL GLANDS: No acute abnormality. KIDNEYS, URETERS AND BLADDER: No stones in the kidneys or ureters. No hydronephrosis. No perinephric or periureteral stranding. Urinary bladder is unremarkable. GI AND BOWEL: Stomach demonstrates no acute abnormality. Appendix is not clearly visualized. There is no bowel obstruction. REPRODUCTIVE ORGANS:  Status post hysterectomy. PERITONEUM AND RETROPERITONEUM: No ascites. No free air. VASCULATURE: Aorta is normal in caliber. ABDOMINAL AND PELVIS LYMPH NODES: No lymphadenopathy. BONES AND SOFT TISSUES: Grossly stable. No acute osseous abnormality. No focal soft tissue abnormality. IMPRESSION: 1. No acute abnormality of the chest, abdomen, or pelvis. 2. Stable perisplenic fluid collection, compatible with chronic hematoma or seroma. 3. Status post hysterectomy. 4. Appendix not visualized. Electronically signed by: James Green MD 12/22/2023 01:02 PM EDT RP Workstation: [id number]    Pending Labs Unresulted Labs (From admission, onward)     Start     Ordered   12/23/23 0500  CBC  Tomorrow morning,   R        12/22/23 1637   12/22/23 1605  hCG, serum, qualitative  Once,   URGENT        10 /14/25 1604   Signed and Held  Comprehensive metabolic panel  Tomorrow morning,   R        Signed and Held            Vitals/Pain Today's Vitals   12/22/23 0925 12/22/23 1100 12/22/23 1138 12/22/23 1327  BP:    (!) 117/55  Pulse:    69  Resp:    16  Temp:    99.8 F (37.7 C)  TempSrc:    Oral  SpO2:    100%  Weight:  154 lb 15.7 oz (70.3 kg) 157 lb (71.2 kg)  Height:   5' 4 (1.626 m)   PainSc: 8        Isolation Precautions No active isolations  Medications Medications  ondansetron  (ZOFRAN ) injection 4 mg (has no administration in time range)  0.9 %  sodium chloride  infusion (has no administration in time range)  sodium chloride  0.9 % bolus 1,000 mL (has no administration in time range)  pantoprazole (PROTONIX) injection 80 mg (has no administration in time range)  lactated ringers  bolus 1,000 mL (0 mLs Intravenous Stopped 12/22/23 1212)  ketamine  (KETALAR ) injection 21 mg (21 mg Intravenous Given 12/22/23 1211)  iohexol  (OMNIPAQUE ) 300 MG/ML solution 100 mL (100 mLs Intravenous Contrast Given 12/22/23 1156)  buprenorphine -naloxone  (SUBOXONE ) 8-2 mg per SL tablet 1.5 tablet (1.5 tablets  Sublingual Given 12/22/23 1515)  metoCLOPramide (REGLAN) injection 5 mg (5 mg Intravenous Given 12/22/23 1504)  diphenhydrAMINE  (BENADRYL ) injection 25 mg (25 mg Intravenous Given 12/22/23 1500)  lactated ringers  bolus 1,000 mL (0 mLs Intravenous Stopped 12/22/23 1653)    Mobility walks

## 2023-12-22 NOTE — H&P (Addendum)
 History and Physical  Emma Fowler:[id number] DOB: [birth year] DOA: 12/22/2023  PCP: Ricard Tawni KIDD, MD Patient coming from: home   I have personally briefly reviewed patient's old medical records in [facility]   Chief Complaint: Nausea vomiting hematemesis and abdominal pain  HPI: Emma Fowler is a 38 y.o. female past medical history of polysubstance abuse on Suboxone  iron deficiency anemia paraesophageal hernia status post repair in January 2025 recently discharged from the hospital 2 weeks prior to admission for abdominal pain nausea vomiting and 2 episodes of hematemesis.  She relates she takes ibuprofen  at least 3 times a day she has done that for the last several months.  She relates she vomited about 2 teaspoons of blood on Sunday denies any melanotic stools her last bowel movement was 5 days prior to admission.  Her pain does not radiate and is epigastric.  In the ED: Her hemoglobin is 13 (she is probably hemoconcentrated) creatinine at baseline troponin is flat specific gravity is 1027 UDS is negative.  CT scan of the chest, abdomen and pelvis showed no acute findings stable Perry splenic fluid collection.   Review of Systems: All systems reviewed and apart from history of presenting illness, are negative.  Past Medical History:  Diagnosis Date   Anemia    Bronchitis    Complication of anesthesia    takes longer to wake up   Depression    History of hiatal hernia    Menorrhagia with irregular cycle 02/16/2023   Paraesophageal hernia    Recurrent UTI    Substance abuse (HCC)    narcotics-on Suboxone    Past Surgical History:  Procedure Laterality Date   BILATERAL SALPINGECTOMY Bilateral 10/22/2020   Procedure: BILATERAL SALPINGECTOMY;  Surgeon: Schermerhorn, Debby PARAS, MD;  Location: [facility];  Service: Gynecology;  Laterality: Bilateral;   COLONOSCOPY WITH PROPOFOL  N/A 12/02/2022   Procedure: COLONOSCOPY WITH PROPOFOL ;  Surgeon: Unk Corinn Skiff, MD;  Location: [facility];  Service: Endoscopy;  Laterality: N/A;   DILATION AND CURETTAGE OF UTERUS     ESOPHAGOGASTRODUODENOSCOPY (EGD) WITH PROPOFOL  N/A 12/02/2022   Procedure: ESOPHAGOGASTRODUODENOSCOPY (EGD) WITH PROPOFOL ;  Surgeon: Unk Corinn Skiff, MD;  Location: [facility];  Service: Endoscopy;  Laterality: N/A;   INSERTION OF MESH  03/26/2023   Procedure: INSERTION OF MESH;  Surgeon: Jordis Laneta FALCON, MD;  Location: [facility];  Service: General;;   TONSILLECTOMY     TUBAL LIGATION     VAGINAL HYSTERECTOMY N/A 10/22/2020   Procedure: HYSTERECTOMY VAGINAL;  Surgeon: Lovetta Debby PARAS, MD;  Location: [facility];  Service: Gynecology;  Laterality: N/A;   XI ROBOTIC ASSISTED PARAESOPHAGEAL HERNIA REPAIR N/A 03/26/2023   Procedure: XI ROBOTIC ASSISTED PARAESOPHAGEAL HERNIA REPAIR;  Surgeon: Jordis Laneta FALCON, MD;  Location: [facility];  Service: General;  Laterality: N/A;   Social History:  reports that she has been smoking e-cigarettes. She has never used smokeless tobacco. She reports current alcohol use. She reports that she does not currently use drugs after having used the following drugs: Other-see comments.   No Known Allergies  Family History  Problem Relation Age of Onset   Heart failure Father    ADD / ADHD Son      Prior to Admission medications   Medication Sig Start Date End Date Taking? Authorizing Provider  buprenorphine -naloxone  (SUBOXONE ) 8-2 mg SUBL SL tablet Place 1.5 tablets under the tongue daily. 11/01/21   [provider]  ondansetron  (ZOFRAN -ODT) 4 MG disintegrating  tablet Take 1 tablet (4 mg total) by mouth every 8 (eight) hours as needed for nausea or vomiting. 12/07/23   Raenelle Coria, MD  WEGOVY 2.4 MG/0.75ML SOAJ Inject 2.4 mg into the skin every 7 (seven) days. 03/12/23   [provider]  albuterol  (PROVENTIL  HFA;VENTOLIN  HFA) 108 (90 Base) MCG/ACT inhaler Inhale 4-6 puffs by mouth every 4 hours as needed for wheezing,  cough, and/or shortness of breath 11/04/15 04/09/19  Gordan Huxley, MD   Physical Exam: Vitals:   12/22/23 0923 12/22/23 1100 12/22/23 1138 12/22/23 1327  BP: 123/89   (!) 117/55  Pulse: 71   69  Resp: 16   16  Temp: 98.7 F (37.1 C)   99.8 F (37.7 C)  TempSrc: Oral   Oral  SpO2: 100%   100%  Weight:  70.3 kg 71.2 kg   Height:   5' 4 (1.626 m)     General exam: Moderately built and nourished patient, lying comfortably supine on the gurney in no obvious distress. Head, eyes and ENT: Nontraumatic and normocephalic.  Neck: Supple. No JVD, carotid bruit or thyromegaly. Lymphatics: No lymphadenopathy. Respiratory system: Clear to auscultation. No increased work of breathing. Cardiovascular system: S1 and S2 heard, RRR. No JVD, murmurs, gallops, clicks or pedal edema. Gastrointestinal system: Positive bowel sounds, soft with exquisite epigastric tenderness on palpation nondistended Central nervous system: Alert and oriented. No focal neurological deficits. Extremities: Symmetric 5 x 5 power. Peripheral pulses symmetrically felt.  Skin: No rashes or acute findings. Musculoskeletal system: Negative exam. Psychiatry: Flat affect retracted hard to get a history from   Labs on Admission:  Basic Metabolic Panel: Recent Labs  Lab 12/22/23 0923  NA 140  K 3.6  CL 102  CO2 25  GLUCOSE 106*  BUN 17  CREATININE 0.68  CALCIUM 10.2   Liver Function Tests: Recent Labs  Lab 12/22/23 0923  AST 26  ALT 26  ALKPHOS 73  BILITOT 0.6  PROT 7.6  ALBUMIN 4.7   Recent Labs  Lab 12/22/23 0923  LIPASE 22   No results for input(s): AMMONIA in the last 168 hours. CBC: Recent Labs  Lab 12/22/23 0923  WBC 12.1*  NEUTROABS 10.3*  HGB 13.4  HCT 40.8  MCV 89.9  PLT 443*   Cardiac Enzymes: No results for input(s): CKTOTAL, CKMB, CKMBINDEX, TROPONINI in the last 168 hours.  BNP (last 3 results) No results for input(s): PROBNP in the last 8760 hours. CBG: No results  for input(s): GLUCAP in the last 168 hours.  Radiological Exams on Admission: CT CHEST ABDOMEN PELVIS W CONTRAST Result Date: 12/22/2023 EXAM: CT CHEST, ABDOMEN AND PELVIS WITH CONTRAST 12/22/2023 12:08:19 PM TECHNIQUE: CT of the chest, abdomen and pelvis was performed with the administration of 100 mL (iohexol  (OMNIPAQUE ) 300 MG/ML solution 100 mL IOHEXOL  300 MG/ML SOLN) intravenous contrast. Multiplanar reformatted images are provided for review. Automated exposure control, iterative reconstruction, and/or weight based adjustment of the mA/kV was utilized to reduce the radiation dose to as low as reasonably achievable. COMPARISON: 12/05/2023 CLINICAL HISTORY: eval for hiatal herrnia repair obstruction or complication. Per chart: Pt reports with vomiting since Sunday. FINDINGS: CHEST: MEDIASTINUM AND LYMPH NODES: Heart and pericardium are unremarkable. The central airways are clear. No definite hiatal hernia is noted. No mediastinal, hilar or axillary lymphadenopathy. LUNGS AND PLEURA: No focal consolidation or pulmonary edema. No pleural effusion or pneumothorax. ABDOMEN AND PELVIS: LIVER: The liver is unremarkable. GALLBLADDER AND BILE DUCTS: Gallbladder is unremarkable. No biliary ductal dilatation.  SPLEEN: Splenic fluid collection is noted consistent with old hematoma or seroma. PANCREAS: No acute abnormality. ADRENAL GLANDS: No acute abnormality. KIDNEYS, URETERS AND BLADDER: No stones in the kidneys or ureters. No hydronephrosis. No perinephric or periureteral stranding. Urinary bladder is unremarkable. GI AND BOWEL: Stomach demonstrates no acute abnormality. Appendix is not clearly visualized. There is no bowel obstruction. REPRODUCTIVE ORGANS: Status post hysterectomy. PERITONEUM AND RETROPERITONEUM: No ascites. No free air. VASCULATURE: Aorta is normal in caliber. ABDOMINAL AND PELVIS LYMPH NODES: No lymphadenopathy. BONES AND SOFT TISSUES: Grossly stable. No acute osseous abnormality. No focal soft  tissue abnormality. IMPRESSION: 1. No acute abnormality of the chest, abdomen, or pelvis. 2. Stable perisplenic fluid collection, compatible with chronic hematoma or seroma. 3. Status post hysterectomy. 4. Appendix not visualized. Electronically signed by: Lynwood Seip MD 12/22/2023 01:02 PM EDT RP Workstation: [id number]    EKG: Independently reviewed.  Sinus rhythm normal axis no T wave abnormalities  Assessment/Plan Acute blood loss anemia/hematemesis of fresh blood/epigastric pain/leukocytosis: -This is a very difficult patient and the initial H&P was awkward and she was very straightforward yes and no answer and limited in her answers.  I really had to talk to her extensively and did deep in order to find out that she was having hematemesis. - I even asked her that she not think vomiting blood was an important symptom she kind of struck it off. - She relates he has been taking NSAIDs every day for the last several months at least 2-3 times a day.  Concerned about gastritis and/or peptic ulcer disease. - Type and screen 2 units by red blood cells.  Placed 2 large-bore's IVs - 2 episode of hematemesis, about 3 days prior to admission. - She relates she stopped taking her Georjean about a month ago. - Started on IV Protonix twice a day fluid resuscitate her, make sure she has 2 large-bore's IV. - Her hemoglobin is 13 but I think she is hemoconcentrated her hemoglobin last time she was in the hospital was 10 her MCV and RDW are unremarkable so there is probably an acute event. - Check an anemia panel. - Give her Zofran  for nausea, Compazine  for refractory nausea and vomiting - Consult GI n.p.o. after midnight for possible endoscopy. - Her last bowel movement was probably 5 days prior to admission started on bowel regimen.  History of narcotic dependence: - UDS is negative. - Will continue Suboxone . - She relates she has not run out of her Suboxone  and she has been taking it accordingly.  Flat  affect retracted: - Consult psychiatry rule out depression. - She has a very unusual demeanor with a flat affect like nothing is important and withdrawal demeanor.     DVT Prophylaxis: SCD's Code Status: Full  Family Communication: boyfriend  Disposition Plan: inpatient      It is my clinical opinion that admission to INPATIENT is reasonable and necessary in this 38 y.o. female epigastric abdominal pain concern about gastritis or ulcer with hematemesis  Given the aforementioned, the predictability of an adverse outcome is felt to be significant. I expect that the patient will require at least 2 midnights in the hospital to treat this condition.  Erle Odell Castor MD [facility]   12/22/2023, 4:32 PM

## 2023-12-23 DIAGNOSIS — F32A Depression, unspecified: Secondary | ICD-10-CM

## 2023-12-23 DIAGNOSIS — K922 Gastrointestinal hemorrhage, unspecified: Secondary | ICD-10-CM

## 2023-12-23 LAB — COMPREHENSIVE METABOLIC PANEL WITH GFR
ALT: 18 U/L (ref 0–44)
AST: 17 U/L (ref 15–41)
Albumin: 3.7 g/dL (ref 3.5–5.0)
Alkaline Phosphatase: 56 U/L (ref 38–126)
Anion gap: 10 (ref 5–15)
BUN: 8 mg/dL (ref 6–20)
CO2: 25 mmol/L (ref 22–32)
Calcium: 9.1 mg/dL (ref 8.9–10.3)
Chloride: 106 mmol/L (ref 98–111)
Creatinine, Ser: 0.52 mg/dL (ref 0.44–1.00)
GFR, Estimated: >60 mL/min (ref >60)
Glucose, Bld: 84 mg/dL (ref 70–99)
Potassium: 3.5 mmol/L (ref 3.5–5.1)
Sodium: 141 mmol/L (ref 135–145)
Total Bilirubin: 0.7 mg/dL (ref 0.0–1.2)
Total Protein: 5.6 g/dL — ABNORMAL LOW (ref 6.5–8.1)

## 2023-12-23 LAB — CBC
HCT: 34 % — ABNORMAL LOW (ref 36.0–46.0)
Hemoglobin: 11.1 g/dL — ABNORMAL LOW (ref 12.0–15.0)
MCH: 29.8 pg (ref 26.0–34.0)
MCHC: 32.6 g/dL (ref 30.0–36.0)
MCV: 91.2 fL (ref 80.0–100.0)
Platelets: 345 K/uL (ref 150–400)
RBC: 3.73 MIL/uL — ABNORMAL LOW (ref 3.87–5.11)
RDW: 12.7 % (ref 11.5–15.5)
WBC: 10.1 K/uL (ref 4.0–10.5)
nRBC: 0 % (ref 0.0–0.2)

## 2023-12-23 LAB — CBC WITH DIFFERENTIAL/PLATELET
Abs Immature Granulocytes: 0.02 K/uL (ref 0.00–0.07)
Basophils Absolute: 0.1 K/uL (ref 0.0–0.1)
Basophils Relative: 1 %
Eosinophils Absolute: 0 K/uL (ref 0.0–0.5)
Eosinophils Relative: 0 %
HCT: 38.3 % (ref 36.0–46.0)
Hemoglobin: 12.3 g/dL (ref 12.0–15.0)
Immature Granulocytes: 0 %
Lymphocytes Relative: 21 %
Lymphs Abs: 2.2 K/uL (ref 0.7–4.0)
MCH: 28.8 pg (ref 26.0–34.0)
MCHC: 32.1 g/dL (ref 30.0–36.0)
MCV: 89.7 fL (ref 80.0–100.0)
Monocytes Absolute: 0.7 K/uL (ref 0.1–1.0)
Monocytes Relative: 7 %
Neutro Abs: 7.7 K/uL (ref 1.7–7.7)
Neutrophils Relative %: 71 %
Platelets: 396 K/uL (ref 150–400)
RBC: 4.27 MIL/uL (ref 3.87–5.11)
RDW: 12.5 % (ref 11.5–15.5)
WBC: 10.8 K/uL — ABNORMAL HIGH (ref 4.0–10.5)
nRBC: 0 % (ref 0.0–0.2)

## 2023-12-23 MED ORDER — SODIUM CHLORIDE 0.9 % IV SOLN: INTRAVENOUS | Status: DC

## 2023-12-23 MED ORDER — ORAL CARE MOUTH RINSE: 15.0000 mL | OROMUCOSAL | Status: DC | PRN

## 2023-12-23 NOTE — Consult Note (Signed)
 Patient is a 38 year-old female for whom psychiatry was consulted for flat affect and questionable depression. While this is not an entirely appropriate indication for psychiatric consultation, the patient was evaluated out of courtesy. On assessment, she was alert and oriented, lying in bed in a fetal position and reporting that she felt nauseated and had been vomiting. She stated, "I did okay last night, but today I'm sick." Her speech was clear but brief, and her demeanor was notably guarded and matter-of-fact. She did not initiate conversation or display spontaneous engagement but answered all questions appropriately when prompted. Affect appeared blunted with minimal emotional range, consistent with her overall guarded presentation. She denied suicidal or homicidal ideation, hallucinations, or other acute psychiatric symptoms. The patient reported no prior psychiatric history and denied current depressive symptoms. At this time, there is no indication of an acute psychiatric disorder or need for psychiatric intervention. Presentation appears more reflective of medical illness and discomfort rather than an underlying mood disturbance. Psychiatry will sign off and remain available should additional concerns arise during hospitalization.

## 2023-12-23 NOTE — Progress Notes (Signed)
 PROGRESS NOTE    Emma Fowler  FMW:[id number] DOB: 1985-04-04 DOA: 12/22/2023 PCP: Ricard Tawni KIDD, MD   Brief Narrative:  HPI: Emma Fowler is a 38 y.o. female past medical history of polysubstance abuse on Suboxone  iron deficiency anemia paraesophageal hernia status post repair in January 2025 recently discharged from the hospital 2 weeks prior to admission for abdominal pain nausea vomiting and 2 episodes of hematemesis.  She relates she takes ibuprofen  at least 3 times a day she has done that for the last several months.  She relates she vomited about 2 teaspoons of blood on Sunday denies any melanotic stools her last bowel movement was 5 days prior to admission.  Her pain does not radiate and is epigastric.   In the ED: Her hemoglobin is 13 (she is probably hemoconcentrated) creatinine at baseline troponin is flat specific gravity is 1027 UDS is negative.  CT scan of the chest, abdomen and pelvis showed no acute findings stable Perry splenic fluid collection.  Assessment & Plan:   Principal Problem:   Acute upper GI bleed Active Problems:   Iron deficiency anemia   Hematemesis of fresh blood   Epigastric abdominal pain   Leukocytosis   Acute blood loss anemia  Acute blood loss anemia/hematemesis/upper GI bleed: History of NSAIDs every day for the last several months at least 2-3 times a day raising concern for gastritis, peptic ulcer disease.  Hemoglobin down from 13.4 yesterday to 11.1 today.  No indication of transfusion.  Monitor every 12 hours and transfuse if less than 7.  Continue PPI.  Seen by GI, plan for EGD tomorrow morning.  She is n.p.o., she is hungry to eat.  I will start her on soft diet, n.p.o. from midnight.   History of narcotic dependence: - UDS is negative. - Will continue Suboxone . - She relates she has not run out of her Suboxone  and she has been taking it accordingly.   Flat affect retracted: - Consult psychiatry rule out depression. - She  has a very unusual demeanor with a flat affect like nothing is important and withdrawal demeanor.  Leukocytosis: Likely due to hemoconcentration and has resolved.  No signs of infection.  DVT prophylaxis: SCDs Start: 12/22/23 1837   Code Status: Full Code  Family Communication: Boyfriend present at bedside.  Plan of care discussed with patient in length and he/she verbalized understanding and agreed with it.  Status is: Inpatient Remains inpatient appropriate because: EGD tomorrow morning   Estimated body mass index is 26.95 kg/m as calculated from the following:   Height as of this encounter: 5' 4 (1.626 m).   Weight as of this encounter: 71.2 kg.    Nutritional Assessment: Body mass index is 26.95 kg/m.Emma Fowler Seen by dietician.  I agree with the assessment and plan as outlined below: Nutrition Status:        . Skin Assessment: I have examined the patient's skin and I agree with the wound assessment as performed by the wound care RN as outlined below:    Consultants:  GI  Procedures:  As above  Antimicrobials:  Anti-infectives (From admission, onward)    None         Subjective: Seen and examined, complains of some nausea but no vomiting.  Complains of abdominal pain but improving.  Wants to eat something.  Wife at the bedside.  Objective: Vitals:   12/22/23 1837 12/22/23 2238 12/23/23 0234 12/23/23 0606  BP: 119/72 113/66 107/65 115/63  Pulse: 76 67 64  75  Resp: 16 16 16 16   Temp: 99.1 F (37.3 C) 99.1 F (37.3 C) 98.7 F (37.1 C) 98.9 F (37.2 C)  TempSrc: Oral Oral Oral Oral  SpO2: 99% 100% 100% 97%  Weight:      Height:        Intake/Output Summary (Last 24 hours) at 12/23/2023 0736 Last data filed at 12/23/2023 0452 Gross per 24 hour  Intake 2598.54 ml  Output --  Net 2598.54 ml   Filed Weights   12/22/23 1100 12/22/23 1138  Weight: 70.3 kg 71.2 kg    Examination:  General exam: Appears lethargic. Respiratory system: Clear to  auscultation. Respiratory effort normal. Cardiovascular system: S1 & S2 heard, RRR. No JVD, murmurs, rubs, gallops or clicks. No pedal edema. Gastrointestinal system: Abdomen is nondistended, soft and mild epigastric tenderness. No organomegaly or masses felt. Normal bowel sounds heard. Central nervous system: Alert and oriented. No focal neurological deficits. Extremities: Symmetric 5 x 5 power. Skin: No rashes, lesions or ulcers   Data Reviewed: I have personally reviewed following labs and imaging studies  CBC: Recent Labs  Lab 12/22/23 0923 12/23/23 0427  WBC 12.1* 10.1  NEUTROABS 10.3*  --   HGB 13.4 11.1*  HCT 40.8 34.0*  MCV 89.9 91.2  PLT 443* 345   Basic Metabolic Panel: Recent Labs  Lab 12/22/23 0923 12/23/23 0427  NA 140 141  K 3.6 3.5  CL 102 106  CO2 25 25  GLUCOSE 106* 84  BUN 17 8  CREATININE 0.68 0.52  CALCIUM 10.2 9.1   GFR: Estimated Creatinine Clearance: 93.2 mL/min (by C-G formula based on SCr of 0.52 mg/dL). Liver Function Tests: Recent Labs  Lab 12/22/23 0923 12/23/23 0427  AST 26 17  ALT 26 18  ALKPHOS 73 56  BILITOT 0.6 0.7  PROT 7.6 5.6*  ALBUMIN 4.7 3.7   Recent Labs  Lab 12/22/23 0923  LIPASE 22   No results for input(s): AMMONIA in the last 168 hours. Coagulation Profile: No results for input(s): INR, PROTIME in the last 168 hours. Cardiac Enzymes: No results for input(s): CKTOTAL, CKMB, CKMBINDEX, TROPONINI in the last 168 hours. BNP (last 3 results) No results for input(s): PROBNP in the last 8760 hours. HbA1C: No results for input(s): HGBA1C in the last 72 hours. CBG: No results for input(s): GLUCAP in the last 168 hours. Lipid Profile: No results for input(s): CHOL, HDL, LDLCALC, TRIG, CHOLHDL, LDLDIRECT in the last 72 hours. Thyroid Function Tests: No results for input(s): TSH, T4TOTAL, FREET4, T3FREE, THYROIDAB in the last 72 hours. Anemia Panel: No results for input(s):  VITAMINB12, FOLATE, FERRITIN, TIBC, IRON, RETICCTPCT in the last 72 hours. Sepsis Labs: No results for input(s): PROCALCITON, LATICACIDVEN in the last 168 hours.  No results found for this or any previous visit (from the past 240 hours).   Radiology Studies: CT CHEST ABDOMEN PELVIS W CONTRAST Result Date: 12/22/2023 EXAM: CT CHEST, ABDOMEN AND PELVIS WITH CONTRAST 12/22/2023 12:08:19 PM TECHNIQUE: CT of the chest, abdomen and pelvis was performed with the administration of 100 mL (iohexol  (OMNIPAQUE ) 300 MG/ML solution 100 mL IOHEXOL  300 MG/ML SOLN) intravenous contrast. Multiplanar reformatted images are provided for review. Automated exposure control, iterative reconstruction, and/or weight based adjustment of the mA/kV was utilized to reduce the radiation dose to as low as reasonably achievable. COMPARISON: 12/05/2023 CLINICAL HISTORY: eval for hiatal herrnia repair obstruction or complication. Per chart: Pt reports with vomiting since Sunday. FINDINGS: CHEST: MEDIASTINUM AND LYMPH NODES: Heart and pericardium  are unremarkable. The central airways are clear. No definite hiatal hernia is noted. No mediastinal, hilar or axillary lymphadenopathy. LUNGS AND PLEURA: No focal consolidation or pulmonary edema. No pleural effusion or pneumothorax. ABDOMEN AND PELVIS: LIVER: The liver is unremarkable. GALLBLADDER AND BILE DUCTS: Gallbladder is unremarkable. No biliary ductal dilatation. SPLEEN: Splenic fluid collection is noted consistent with old hematoma or seroma. PANCREAS: No acute abnormality. ADRENAL GLANDS: No acute abnormality. KIDNEYS, URETERS AND BLADDER: No stones in the kidneys or ureters. No hydronephrosis. No perinephric or periureteral stranding. Urinary bladder is unremarkable. GI AND BOWEL: Stomach demonstrates no acute abnormality. Appendix is not clearly visualized. There is no bowel obstruction. REPRODUCTIVE ORGANS: Status post hysterectomy. PERITONEUM AND RETROPERITONEUM: No  ascites. No free air. VASCULATURE: Aorta is normal in caliber. ABDOMINAL AND PELVIS LYMPH NODES: No lymphadenopathy. BONES AND SOFT TISSUES: Grossly stable. No acute osseous abnormality. No focal soft tissue abnormality. IMPRESSION: 1. No acute abnormality of the chest, abdomen, or pelvis. 2. Stable perisplenic fluid collection, compatible with chronic hematoma or seroma. 3. Status post hysterectomy. 4. Appendix not visualized. Electronically signed by: Lynwood Seip MD 12/22/2023 01:02 PM EDT RP Workstation: [id number]    Scheduled Meds:  buprenorphine -naloxone   1.5 tablet Sublingual Daily   pantoprazole (PROTONIX) IV  40 mg Intravenous Q12H   polyethylene glycol  17 g Oral BID   Continuous Infusions:  sodium chloride  Stopped (12/22/23 1721)   dextrose  5 % and 0.45 % NaCl 76 mL/hr at 12/23/23 0452     LOS: 1 day   Fredia Skeeter, MD [facility]  12/23/2023, 7:36 AM   *Please note that this is a verbal dictation therefore any spelling or grammatical errors are due to the Dragon Medical One system interpretation.  Please page via Amion and do not message via secure chat for urgent patient care matters. Secure chat can be used for non urgent patient care matters.  How to contact the [facility] Attending or Consulting provider 7A - 7P or covering provider during after hours 7P -7A, for this patient?  Check the care team in [facility] and look for a) attending/consulting [facility] provider listed and b) the [facility] team listed. Page or secure chat 7A-7P. Log into [email or web address] and use [facility]'s universal password to access. If you do not have the password, please contact the hospital operator. Locate the [facility] provider you are looking for under [facility] and page to a number that you can be directly reached. If you still have difficulty reaching the provider, please page the [facility] (Director on Call) for the Hospitalists listed on amion for assistance.

## 2023-12-23 NOTE — Plan of Care (Signed)
  Problem: Clinical Measurements: Goal: Respiratory complications will improve Outcome: Progressing Goal: Cardiovascular complication will be avoided Outcome: Progressing   Problem: Activity: Goal: Risk for activity intolerance will decrease Outcome: Progressing   Problem: Coping: Goal: Level of anxiety will decrease Outcome: Progressing   Problem: Pain Managment: Goal: General experience of comfort will improve and/or be controlled Outcome: Progressing   Problem: Skin Integrity: Goal: Risk for impaired skin integrity will decrease Outcome: Progressing

## 2023-12-23 NOTE — Consult Note (Signed)
 Referring Provider: TH Primary Care Physician:  Ricard Tawni KIDD, MD Primary Gastroenterologist: [place]  Reason for Consultation: GI bleed  HPI: Emma Fowler is a 38 y.o. female with past medical history of large paraesophageal hernia status post laparoscopic hernia repair with Bio-A mesh placement and Nissen fundoplication in January 2025, history of polysubstance abuse currently on Suboxone , history of iron deficiency in the past presented to the hospital with nausea, vomiting, hematemesis and abdominal pain.  Patient with history of significant ibuprofen  use daily for last few months.  Initial blood work yesterday showed normal CBC, CMP and lipase.  CBC appeared to be mildly hemoconcentrated.  Repeat blood work this morning showed hemoglobin of 11.1 which is stable compared to January 2025.  She is complaining of constipation with sometimes no bowel movement for 1 week at a time.  Complaining of generalized abdominal discomfort.  Denies seeing any blood in the stool or black stool.  CT chest abdomen pelvis with IV contrast yesterday showed no acute changes and stable perisplenic fluid collection likely chronic hematoma or seroma related to paraesophageal hernia surgery.   EGD in September 2024 by Dr. Unk at [facility] showed large paraesophageal hernia otherwise normal EGD.  Negative for H. pylori and celiac disease.   Past Medical History:  Diagnosis Date   Anemia    Bronchitis    Complication of anesthesia    takes longer to wake up   Depression    History of hiatal hernia    Menorrhagia with irregular cycle 02/16/2023   Paraesophageal hernia    Recurrent UTI    Substance abuse (HCC)    narcotics-on Suboxone     Past Surgical History:  Procedure Laterality Date   BILATERAL SALPINGECTOMY Bilateral 10/22/2020   Procedure: BILATERAL SALPINGECTOMY;  Surgeon: Schermerhorn, Debby PARAS, MD;  Location: [facility];  Service: Gynecology;  Laterality:  Bilateral;   COLONOSCOPY WITH PROPOFOL  N/A 12/02/2022   Procedure: COLONOSCOPY WITH PROPOFOL ;  Surgeon: Unk Corinn Skiff, MD;  Location: [facility];  Service: Endoscopy;  Laterality: N/A;   DILATION AND CURETTAGE OF UTERUS     ESOPHAGOGASTRODUODENOSCOPY (EGD) WITH PROPOFOL  N/A 12/02/2022   Procedure: ESOPHAGOGASTRODUODENOSCOPY (EGD) WITH PROPOFOL ;  Surgeon: Unk Corinn Skiff, MD;  Location: [facility];  Service: Endoscopy;  Laterality: N/A;   INSERTION OF MESH  03/26/2023   Procedure: INSERTION OF MESH;  Surgeon: Jordis Laneta FALCON, MD;  Location: [facility];  Service: General;;   TONSILLECTOMY     TUBAL LIGATION     VAGINAL HYSTERECTOMY N/A 10/22/2020   Procedure: HYSTERECTOMY VAGINAL;  Surgeon: Lovetta Debby PARAS, MD;  Location: [facility];  Service: Gynecology;  Laterality: N/A;   XI ROBOTIC ASSISTED PARAESOPHAGEAL HERNIA REPAIR N/A 03/26/2023   Procedure: XI ROBOTIC ASSISTED PARAESOPHAGEAL HERNIA REPAIR;  Surgeon: Jordis Laneta FALCON, MD;  Location: [facility];  Service: General;  Laterality: N/A;    Prior to Admission medications   Medication Sig Start Date End Date Taking? Authorizing Provider  acetaminophen  (TYLENOL ) 500 MG tablet Take 1,000 mg by mouth daily as needed for mild pain (pain score 1-3) or moderate pain (pain score 4-6).   Yes [provider]  buprenorphine -naloxone  (SUBOXONE ) 8-2 mg SUBL SL tablet Place 0.5-1 tablets under the tongue See admin instructions. Take 0.5 tablet by mouth in the morning and take 1 tablet by mouth at bedtime. 11/01/21  Yes [provider]  ibuprofen  (ADVIL ) 200 MG tablet Take 800 mg by mouth daily as needed for mild pain (pain score  1-3) or moderate pain (pain score 4-6).   Yes [provider]  ondansetron  (ZOFRAN -ODT) 4 MG disintegrating tablet Take 1 tablet (4 mg total) by mouth every 8 (eight) hours as needed for nausea or vomiting. Patient not taking: Reported on 12/23/2023 12/07/23   Raenelle Coria, MD   albuterol  (PROVENTIL  HFA;VENTOLIN  HFA) 108 (90 Base) MCG/ACT inhaler Inhale 4-6 puffs by mouth every 4 hours as needed for wheezing, cough, and/or shortness of breath 11/04/15 04/09/19  Gordan Huxley, MD    Scheduled Meds:  buprenorphine -naloxone   1.5 tablet Sublingual Daily   pantoprazole (PROTONIX) IV  40 mg Intravenous Q12H   polyethylene glycol  17 g Oral BID   Continuous Infusions:  sodium chloride  Stopped (12/22/23 1721)   dextrose  5 % and 0.45 % NaCl 76 mL/hr at 12/23/23 0452   PRN Meds:.acetaminophen  **OR** acetaminophen , ondansetron  **OR** ondansetron  (ZOFRAN ) IV, mouth rinse, prochlorperazine   Allergies as of 12/22/2023   (No Known Allergies)    Family History  Problem Relation Age of Onset   Heart failure Father    ADD / ADHD Son     Social History   Socioeconomic History   Marital status: Married    Spouse name: John   Number of children: Not on file   Years of education: Not on file   Highest education level: Not on file  Occupational History   Not on file  Tobacco Use   Smoking status: Every Day    Types: E-cigarettes   Smokeless tobacco: Never  Vaping Use   Vaping status: Every Day   Substances: Nicotine, Flavoring  Substance and Sexual Activity   Alcohol use: Yes    Comment: occ   Drug use: Not Currently    Types: Other-see comments    Comment: narcotic abuse-taking Suboxone    Sexual activity: Yes  Other Topics Concern   Not on file  Social History Narrative   Not on file   Social Drivers of Health   Financial Resource Strain: Not on file  Food Insecurity: No Food Insecurity (12/23/2023)   Hunger Vital Sign    Worried About Running Out of Food in the Last Year: Never true    Ran Out of Food in the Last Year: Never true  Transportation Needs: No Transportation Needs (12/23/2023)   PRAPARE - Administrator, Civil Service (Medical): No    Lack of Transportation (Non-Medical): No  Physical Activity: Not on file  Stress: Not on  file  Social Connections: Not on file  Intimate Partner Violence: Not At Risk (12/23/2023)   Humiliation, Afraid, Rape, and Kick questionnaire    Fear of Current or Ex-Partner: No    Emotionally Abused: No    Physically Abused: No    Sexually Abused: No    Review of Systems: All negative except as stated above in HPI.  Physical Exam: Vital signs: Vitals:   12/23/23 0234 12/23/23 0606  BP: 107/65 115/63  Pulse: 64 75  Resp: 16 16  Temp: 98.7 F (37.1 C) 98.9 F (37.2 C)  SpO2: 100% 97%   Last BM Date : 12/20/23 General:   Alert,  Well-developed, well-nourished, pleasant and cooperative in NAD Normocephalic, atraumatic Extraocular movement intact Lungs: No visible respiratory distress Heart:  Regular rate and rhythm; no murmurs, clicks, rubs,  or gallops. Abdomen: Generalized discomfort with epigastric tenderness to palpation, abdomen is minimally distended, bowel sound present, no peritoneal signs Mood and affect normal Alert and oriented x 3 Rectal:  Deferred  GI:  Lab  Results: Recent Labs    12/22/23 0923 12/23/23 0427  WBC 12.1* 10.1  HGB 13.4 11.1*  HCT 40.8 34.0*  PLT 443* 345   BMET Recent Labs    12/22/23 0923 12/23/23 0427  NA 140 141  K 3.6 3.5  CL 102 106  CO2 25 25  GLUCOSE 106* 84  BUN 17 8  CREATININE 0.68 0.52  CALCIUM 10.2 9.1   LFT Recent Labs    12/23/23 0427  PROT 5.6*  ALBUMIN 3.7  AST 17  ALT 18  ALKPHOS 56  BILITOT 0.7   PT/INR No results for input(s): LABPROT, INR in the last 72 hours.   Studies/Results: CT CHEST ABDOMEN PELVIS W CONTRAST Result Date: 12/22/2023 EXAM: CT CHEST, ABDOMEN AND PELVIS WITH CONTRAST 12/22/2023 12:08:19 PM TECHNIQUE: CT of the chest, abdomen and pelvis was performed with the administration of 100 mL (iohexol  (OMNIPAQUE ) 300 MG/ML solution 100 mL IOHEXOL  300 MG/ML SOLN) intravenous contrast. Multiplanar reformatted images are provided for review. Automated exposure control, iterative  reconstruction, and/or weight based adjustment of the mA/kV was utilized to reduce the radiation dose to as low as reasonably achievable. COMPARISON: 12/05/2023 CLINICAL HISTORY: eval for hiatal herrnia repair obstruction or complication. Per chart: Pt reports with vomiting since Sunday. FINDINGS: CHEST: MEDIASTINUM AND LYMPH NODES: Heart and pericardium are unremarkable. The central airways are clear. No definite hiatal hernia is noted. No mediastinal, hilar or axillary lymphadenopathy. LUNGS AND PLEURA: No focal consolidation or pulmonary edema. No pleural effusion or pneumothorax. ABDOMEN AND PELVIS: LIVER: The liver is unremarkable. GALLBLADDER AND BILE DUCTS: Gallbladder is unremarkable. No biliary ductal dilatation. SPLEEN: Splenic fluid collection is noted consistent with old hematoma or seroma. PANCREAS: No acute abnormality. ADRENAL GLANDS: No acute abnormality. KIDNEYS, URETERS AND BLADDER: No stones in the kidneys or ureters. No hydronephrosis. No perinephric or periureteral stranding. Urinary bladder is unremarkable. GI AND BOWEL: Stomach demonstrates no acute abnormality. Appendix is not clearly visualized. There is no bowel obstruction. REPRODUCTIVE ORGANS: Status post hysterectomy. PERITONEUM AND RETROPERITONEUM: No ascites. No free air. VASCULATURE: Aorta is normal in caliber. ABDOMINAL AND PELVIS LYMPH NODES: No lymphadenopathy. BONES AND SOFT TISSUES: Grossly stable. No acute osseous abnormality. No focal soft tissue abnormality. IMPRESSION: 1. No acute abnormality of the chest, abdomen, or pelvis. 2. Stable perisplenic fluid collection, compatible with chronic hematoma or seroma. 3. Status post hysterectomy. 4. Appendix not visualized. Electronically signed by: Lynwood Seip MD 12/22/2023 01:02 PM EDT RP Workstation: [id number]    Impression/Plan: -Nausea vomiting and hematemesis in a patient with history of paraesophageal hernia repair earlier this year and significant NSAID use.  Need to rule  out esophagitis, gastritis and ulcer disease. - Perisplenic fluid collection likely chronic hematoma or seroma from paraesophageal hernia repair surgery earlier this year - Chronic constipation with sometimes no bowel movements for 1 week at a time.  Could be cause for her intermittent abdominal discomfort  Recommendations ------------------------- - Okay to have soft diet today.  Plan for EGD tomorrow.  Keep n.p.o. past midnight. - Recommend MiraLAX  twice a day on a regular basis to help out with the constipation  Risks (bleeding, infection, bowel perforation that could require surgery, sedation-related changes in cardiopulmonary systems), benefits (identification and possible treatment of source of symptoms, exclusion of certain causes of symptoms), and alternatives (watchful waiting, radiographic imaging studies, empiric medical treatment)  were explained to patient/family in detail and patient wishes to proceed.     LOS: 1 day   Layla Lah  MD, FACP  12/23/2023, 9:52 AM  Contact #  [phone]

## 2023-12-23 NOTE — H&P (View-Only) (Signed)
 Referring Provider: TH Primary Care Physician:  Ricard Tawni KIDD, MD Primary Gastroenterologist: Sampson  Reason for Consultation: GI bleed  HPI: Emma Fowler is a 38 y.o. female with past medical history of large paraesophageal hernia status post laparoscopic hernia repair with Bio-A mesh placement and Nissen fundoplication in January 2025, history of polysubstance abuse currently on Suboxone , history of iron deficiency in the past presented to the hospital with nausea, vomiting, hematemesis and abdominal pain.  Patient with history of significant ibuprofen  use daily for last few months.  Initial blood work yesterday showed normal CBC, CMP and lipase.  CBC appeared to be mildly hemoconcentrated.  Repeat blood work this morning showed hemoglobin of 11.1 which is stable compared to January 2025.  She is complaining of constipation with sometimes no bowel movement for 1 week at a time.  Complaining of generalized abdominal discomfort.  Denies seeing any blood in the stool or black stool.  CT chest abdomen pelvis with IV contrast yesterday showed no acute changes and stable perisplenic fluid collection likely chronic hematoma or seroma related to paraesophageal hernia surgery.   EGD in September 2024 by Dr. Unk at [facility] showed large paraesophageal hernia otherwise normal EGD.  Negative for H. pylori and celiac disease.   Past Medical History:  Diagnosis Date   Anemia    Bronchitis    Complication of anesthesia    takes longer to wake up   Depression    History of hiatal hernia    Menorrhagia with irregular cycle 02/16/2023   Paraesophageal hernia    Recurrent UTI    Substance abuse (HCC)    narcotics-on Suboxone     Past Surgical History:  Procedure Laterality Date   BILATERAL SALPINGECTOMY Bilateral 10/22/2020   Procedure: BILATERAL SALPINGECTOMY;  Surgeon: Schermerhorn, Debby PARAS, MD;  Location: [facility];  Service: Gynecology;  Laterality:  Bilateral;   COLONOSCOPY WITH PROPOFOL  N/A 12/02/2022   Procedure: COLONOSCOPY WITH PROPOFOL ;  Surgeon: Unk Corinn Skiff, MD;  Location: [facility];  Service: Endoscopy;  Laterality: N/A;   DILATION AND CURETTAGE OF UTERUS     ESOPHAGOGASTRODUODENOSCOPY (EGD) WITH PROPOFOL  N/A 12/02/2022   Procedure: ESOPHAGOGASTRODUODENOSCOPY (EGD) WITH PROPOFOL ;  Surgeon: Unk Corinn Skiff, MD;  Location: [facility];  Service: Endoscopy;  Laterality: N/A;   INSERTION OF MESH  03/26/2023   Procedure: INSERTION OF MESH;  Surgeon: Jordis Laneta FALCON, MD;  Location: [facility];  Service: General;;   TONSILLECTOMY     TUBAL LIGATION     VAGINAL HYSTERECTOMY N/A 10/22/2020   Procedure: HYSTERECTOMY VAGINAL;  Surgeon: Lovetta Debby PARAS, MD;  Location: [facility];  Service: Gynecology;  Laterality: N/A;   XI ROBOTIC ASSISTED PARAESOPHAGEAL HERNIA REPAIR N/A 03/26/2023   Procedure: XI ROBOTIC ASSISTED PARAESOPHAGEAL HERNIA REPAIR;  Surgeon: Jordis Laneta FALCON, MD;  Location: [facility];  Service: General;  Laterality: N/A;    Prior to Admission medications   Medication Sig Start Date End Date Taking? Authorizing Provider  acetaminophen  (TYLENOL ) 500 MG tablet Take 1,000 mg by mouth daily as needed for mild pain (pain score 1-3) or moderate pain (pain score 4-6).   Yes [provider]  buprenorphine -naloxone  (SUBOXONE ) 8-2 mg SUBL SL tablet Place 0.5-1 tablets under the tongue See admin instructions. Take 0.5 tablet by mouth in the morning and take 1 tablet by mouth at bedtime. 11/01/21  Yes [provider]  ibuprofen  (ADVIL ) 200 MG tablet Take 800 mg by mouth daily as needed for mild pain (pain score  1-3) or moderate pain (pain score 4-6).   Yes [provider]  ondansetron  (ZOFRAN -ODT) 4 MG disintegrating tablet Take 1 tablet (4 mg total) by mouth every 8 (eight) hours as needed for nausea or vomiting. Patient not taking: Reported on 12/23/2023 12/07/23   Raenelle Coria, MD   albuterol  (PROVENTIL  HFA;VENTOLIN  HFA) 108 (90 Base) MCG/ACT inhaler Inhale 4-6 puffs by mouth every 4 hours as needed for wheezing, cough, and/or shortness of breath 11/04/15 04/09/19  Gordan Huxley, MD    Scheduled Meds:  buprenorphine -naloxone   1.5 tablet Sublingual Daily   pantoprazole (PROTONIX) IV  40 mg Intravenous Q12H   polyethylene glycol  17 g Oral BID   Continuous Infusions:  sodium chloride  Stopped (12/22/23 1721)   dextrose  5 % and 0.45 % NaCl 76 mL/hr at 12/23/23 0452   PRN Meds:.acetaminophen  **OR** acetaminophen , ondansetron  **OR** ondansetron  (ZOFRAN ) IV, mouth rinse, prochlorperazine   Allergies as of 12/22/2023   (No Known Allergies)    Family History  Problem Relation Age of Onset   Heart failure Father    ADD / ADHD Son     Social History   Socioeconomic History   Marital status: Married    Spouse name: John   Number of children: Not on file   Years of education: Not on file   Highest education level: Not on file  Occupational History   Not on file  Tobacco Use   Smoking status: Every Day    Types: E-cigarettes   Smokeless tobacco: Never  Vaping Use   Vaping status: Every Day   Substances: Nicotine, Flavoring  Substance and Sexual Activity   Alcohol use: Yes    Comment: occ   Drug use: Not Currently    Types: Other-see comments    Comment: narcotic abuse-taking Suboxone    Sexual activity: Yes  Other Topics Concern   Not on file  Social History Narrative   Not on file   Social Drivers of Health   Financial Resource Strain: Not on file  Food Insecurity: No Food Insecurity (12/23/2023)   Hunger Vital Sign    Worried About Running Out of Food in the Last Year: Never true    Ran Out of Food in the Last Year: Never true  Transportation Needs: No Transportation Needs (12/23/2023)   PRAPARE - Administrator, Civil Service (Medical): No    Lack of Transportation (Non-Medical): No  Physical Activity: Not on file  Stress: Not on  file  Social Connections: Not on file  Intimate Partner Violence: Not At Risk (12/23/2023)   Humiliation, Afraid, Rape, and Kick questionnaire    Fear of Current or Ex-Partner: No    Emotionally Abused: No    Physically Abused: No    Sexually Abused: No    Review of Systems: All negative except as stated above in HPI.  Physical Exam: Vital signs: Vitals:   12/23/23 0234 12/23/23 0606  BP: 107/65 115/63  Pulse: 64 75  Resp: 16 16  Temp: 98.7 F (37.1 C) 98.9 F (37.2 C)  SpO2: 100% 97%   Last BM Date : 12/20/23 General:   Alert,  Well-developed, well-nourished, pleasant and cooperative in NAD Normocephalic, atraumatic Extraocular movement intact Lungs: No visible respiratory distress Heart:  Regular rate and rhythm; no murmurs, clicks, rubs,  or gallops. Abdomen: Generalized discomfort with epigastric tenderness to palpation, abdomen is minimally distended, bowel sound present, no peritoneal signs Mood and affect normal Alert and oriented x 3 Rectal:  Deferred  GI:  Lab  Results: Recent Labs    12/22/23 0923 12/23/23 0427  WBC 12.1* 10.1  HGB 13.4 11.1*  HCT 40.8 34.0*  PLT 443* 345   BMET Recent Labs    12/22/23 0923 12/23/23 0427  NA 140 141  K 3.6 3.5  CL 102 106  CO2 25 25  GLUCOSE 106* 84  BUN 17 8  CREATININE 0.68 0.52  CALCIUM 10.2 9.1   LFT Recent Labs    12/23/23 0427  PROT 5.6*  ALBUMIN 3.7  AST 17  ALT 18  ALKPHOS 56  BILITOT 0.7   PT/INR No results for input(s): LABPROT, INR in the last 72 hours.   Studies/Results: CT CHEST ABDOMEN PELVIS W CONTRAST Result Date: 12/22/2023 EXAM: CT CHEST, ABDOMEN AND PELVIS WITH CONTRAST 12/22/2023 12:08:19 PM TECHNIQUE: CT of the chest, abdomen and pelvis was performed with the administration of 100 mL (iohexol  (OMNIPAQUE ) 300 MG/ML solution 100 mL IOHEXOL  300 MG/ML SOLN) intravenous contrast. Multiplanar reformatted images are provided for review. Automated exposure control, iterative  reconstruction, and/or weight based adjustment of the mA/kV was utilized to reduce the radiation dose to as low as reasonably achievable. COMPARISON: 12/05/2023 CLINICAL HISTORY: eval for hiatal herrnia repair obstruction or complication. Per chart: Pt reports with vomiting since Sunday. FINDINGS: CHEST: MEDIASTINUM AND LYMPH NODES: Heart and pericardium are unremarkable. The central airways are clear. No definite hiatal hernia is noted. No mediastinal, hilar or axillary lymphadenopathy. LUNGS AND PLEURA: No focal consolidation or pulmonary edema. No pleural effusion or pneumothorax. ABDOMEN AND PELVIS: LIVER: The liver is unremarkable. GALLBLADDER AND BILE DUCTS: Gallbladder is unremarkable. No biliary ductal dilatation. SPLEEN: Splenic fluid collection is noted consistent with old hematoma or seroma. PANCREAS: No acute abnormality. ADRENAL GLANDS: No acute abnormality. KIDNEYS, URETERS AND BLADDER: No stones in the kidneys or ureters. No hydronephrosis. No perinephric or periureteral stranding. Urinary bladder is unremarkable. GI AND BOWEL: Stomach demonstrates no acute abnormality. Appendix is not clearly visualized. There is no bowel obstruction. REPRODUCTIVE ORGANS: Status post hysterectomy. PERITONEUM AND RETROPERITONEUM: No ascites. No free air. VASCULATURE: Aorta is normal in caliber. ABDOMINAL AND PELVIS LYMPH NODES: No lymphadenopathy. BONES AND SOFT TISSUES: Grossly stable. No acute osseous abnormality. No focal soft tissue abnormality. IMPRESSION: 1. No acute abnormality of the chest, abdomen, or pelvis. 2. Stable perisplenic fluid collection, compatible with chronic hematoma or seroma. 3. Status post hysterectomy. 4. Appendix not visualized. Electronically signed by: Lynwood Seip MD 12/22/2023 01:02 PM EDT RP Workstation: [id number]    Impression/Plan: -Nausea vomiting and hematemesis in a patient with history of paraesophageal hernia repair earlier this year and significant NSAID use.  Need to rule  out esophagitis, gastritis and ulcer disease. - Perisplenic fluid collection likely chronic hematoma or seroma from paraesophageal hernia repair surgery earlier this year - Chronic constipation with sometimes no bowel movements for 1 week at a time.  Could be cause for her intermittent abdominal discomfort  Recommendations ------------------------- - Okay to have soft diet today.  Plan for EGD tomorrow.  Keep n.p.o. past midnight. - Recommend MiraLAX  twice a day on a regular basis to help out with the constipation  Risks (bleeding, infection, bowel perforation that could require surgery, sedation-related changes in cardiopulmonary systems), benefits (identification and possible treatment of source of symptoms, exclusion of certain causes of symptoms), and alternatives (watchful waiting, radiographic imaging studies, empiric medical treatment)  were explained to patient/family in detail and patient wishes to proceed.     LOS: 1 day   Layla Lah  MD, FACP  12/23/2023, 9:52 AM  Contact #  [phone]

## 2023-12-23 NOTE — Plan of Care (Signed)

## 2023-12-24 ENCOUNTER — Inpatient Hospital Stay (HOSPITAL_COMMUNITY): Admitting: Certified Registered"

## 2023-12-24 ENCOUNTER — Other Ambulatory Visit (HOSPITAL_COMMUNITY): Payer: Self-pay

## 2023-12-24 ENCOUNTER — Encounter (HOSPITAL_COMMUNITY)
Admission: EM | Disposition: A | Discharge status: Home / Self Care | Payer: Self-pay | Source: Home / Self Care | Attending: Emergency Medicine

## 2023-12-24 ENCOUNTER — Encounter (HOSPITAL_COMMUNITY): Payer: Self-pay | Admitting: Internal Medicine

## 2023-12-24 ENCOUNTER — Encounter: Payer: Self-pay | Admitting: Oncology

## 2023-12-24 DIAGNOSIS — K922 Gastrointestinal hemorrhage, unspecified: Secondary | ICD-10-CM | POA: Diagnosis not present

## 2023-12-24 DIAGNOSIS — K2970 Gastritis, unspecified, without bleeding: Secondary | ICD-10-CM | POA: Insufficient documentation

## 2023-12-24 DIAGNOSIS — K2100 Gastro-esophageal reflux disease with esophagitis, without bleeding: Secondary | ICD-10-CM

## 2023-12-24 DIAGNOSIS — K209 Esophagitis, unspecified without bleeding: Secondary | ICD-10-CM | POA: Insufficient documentation

## 2023-12-24 HISTORY — PX: ESOPHAGOGASTRODUODENOSCOPY: SHX5428

## 2023-12-24 LAB — CBC WITH DIFFERENTIAL/PLATELET
Abs Immature Granulocytes: 0.02 K/uL (ref 0.00–0.07)
Basophils Absolute: 0.1 K/uL (ref 0.0–0.1)
Basophils Relative: 1 %
Eosinophils Absolute: 0.1 K/uL (ref 0.0–0.5)
Eosinophils Relative: 1 %
HCT: 35.3 % — ABNORMAL LOW (ref 36.0–46.0)
Hemoglobin: 11.1 g/dL — ABNORMAL LOW (ref 12.0–15.0)
Immature Granulocytes: 0 %
Lymphocytes Relative: 44 %
Lymphs Abs: 4 K/uL (ref 0.7–4.0)
MCH: 28.3 pg (ref 26.0–34.0)
MCHC: 31.4 g/dL (ref 30.0–36.0)
MCV: 90.1 fL (ref 80.0–100.0)
Monocytes Absolute: 0.9 K/uL (ref 0.1–1.0)
Monocytes Relative: 10 %
Neutro Abs: 4.1 K/uL (ref 1.7–7.7)
Neutrophils Relative %: 44 %
Platelets: 356 K/uL (ref 150–400)
RBC: 3.92 MIL/uL (ref 3.87–5.11)
RDW: 12.6 % (ref 11.5–15.5)
WBC: 9.1 K/uL (ref 4.0–10.5)
nRBC: 0 % (ref 0.0–0.2)

## 2023-12-24 LAB — BASIC METABOLIC PANEL WITH GFR
Anion gap: 9 (ref 5–15)
BUN: 9 mg/dL (ref 6–20)
CO2: 27 mmol/L (ref 22–32)
Calcium: 9.2 mg/dL (ref 8.9–10.3)
Chloride: 105 mmol/L (ref 98–111)
Creatinine, Ser: 0.48 mg/dL (ref 0.44–1.00)
GFR, Estimated: >60 mL/min (ref >60)
Glucose, Bld: 86 mg/dL (ref 70–99)
Potassium: 3.8 mmol/L (ref 3.5–5.1)
Sodium: 141 mmol/L (ref 135–145)

## 2023-12-24 SURGERY — EGD (ESOPHAGOGASTRODUODENOSCOPY)
Anesthesia: Monitor Anesthesia Care

## 2023-12-24 MED ORDER — PROPOFOL 500 MG/50ML IV EMUL
INTRAVENOUS | Status: DC | PRN
  Administered 2023-12-24: 150 ug/kg/min via INTRAVENOUS

## 2023-12-24 MED ORDER — OMEPRAZOLE 40 MG PO CPDR
DELAYED_RELEASE_CAPSULE | ORAL | 0 refills | Status: AC
  Filled 2023-12-24: qty 84, 56d supply, fill #0

## 2023-12-24 MED ORDER — PROPOFOL 10 MG/ML IV BOLUS
INTRAVENOUS | Status: DC | PRN
  Administered 2023-12-24: 20 mg via INTRAVENOUS
  Administered 2023-12-24: 80 mg via INTRAVENOUS

## 2023-12-24 MED ORDER — LIDOCAINE 2% (20 MG/ML) 5 ML SYRINGE
INTRAMUSCULAR | Status: DC | PRN
  Administered 2023-12-24: 40 mg via INTRAVENOUS

## 2023-12-24 MED ORDER — SUCRALFATE 1 G PO TABS
1.0000 g | ORAL_TABLET | Freq: Two times a day (BID) | ORAL | 0 refills | Status: AC
  Filled 2023-12-24: qty 56, 28d supply, fill #0

## 2023-12-24 NOTE — Op Note (Signed)
[facility] Patient Name: Emma Fowler Procedure Date: 12/24/2023 MRN: [id number] Attending MD: Layla Lah , MD, [id number] Date of Birth: [birth year] CSN: [id number] Age: 38 Admit Type: Inpatient Procedure:                Upper GI endoscopy Indications:              Hematemesis Providers:                Layla Lah, MD, Jacquelyn Jaci Pierce, RN,                            Corky Czech, Technician, Ronnald Fish, CRNA Referring MD:              Medicines:                Sedation Administered by an Anesthesia Professional Complications:            No immediate complications. Estimated Blood Loss:     Estimated blood loss was minimal. Procedure:                Pre-Anesthesia Assessment:                           - Prior to the procedure, a History and Physical                            was performed, and patient medications and                            allergies were reviewed. The patient's tolerance of                            previous anesthesia was also reviewed. The risks                            and benefits of the procedure and the sedation                            options and risks were discussed with the patient.                            All questions were answered, and informed consent                            was obtained. Prior Anticoagulants: The patient has                            taken no anticoagulant or antiplatelet agents. ASA                            Grade Assessment: III - A patient with severe                            systemic disease. After reviewing the risks and  benefits, the patient was deemed in satisfactory                            condition to undergo the procedure.                           After obtaining informed consent, the endoscope was                            passed under direct vision. Throughout the                            procedure, the patient's blood pressure,  pulse, and                            oxygen saturations were monitored continuously. The                            [id number]-[id number] ([id number]) Olympus endoscope was introduced                            through the mouth, and advanced to the second part                            of duodenum. The upper GI endoscopy was                            accomplished without difficulty. The patient                            tolerated the procedure well. Scope In: Scope Out: Findings:      LA Grade B (one or more mucosal breaks greater than 5 mm, not extending       between the tops of two mucosal folds) esophagitis with no bleeding was       found in the distal esophagus. Biopsies were taken with a cold forceps       for histology.      Evidence of a Nissen fundoplication was found in the cardia. The wrap       appeared intact. This was traversed.      Bilious fluid was found in the gastric body.      Patchy mildly erythematous mucosa was found in the prepyloric region of       the stomach. Biopsies were taken with a cold forceps for histology.      The duodenal bulb, first portion of the duodenum and second portion of       the duodenum were normal. Impression:               - LA Grade B reflux esophagitis with no bleeding.                            Biopsied.                           - A Nissen fundoplication was found. The wrap  appears intact.                           - Bilious gastric fluid.                           - Erythematous mucosa in the prepyloric region of                            the stomach. Biopsied.                           - Normal duodenal bulb, first portion of the                            duodenum and second portion of the duodenum. Moderate Sedation:      Moderate (conscious) sedation was personally administered by an       anesthesia professional. The following parameters were monitored: oxygen       saturation, heart rate, blood pressure,  and response to care. Recommendation:           - Return patient to hospital ward for ongoing care.                           - Resume previous diet.                           - Continue present medications.                           - Await pathology results. Procedure Code(s):        --- Professional ---                           [phone], Esophagogastroduodenoscopy, flexible,                            transoral; with biopsy, single or multiple Diagnosis Code(s):        --- Professional ---                           K21.00, Gastro-esophageal reflux disease with                            esophagitis, without bleeding                           Z98.890, Other specified postprocedural states                           K31.89, Other diseases of stomach and duodenum                           K92.0, Hematemesis CPT copyright 2022 [employer]. All rights reserved. The codes documented in this report are preliminary and upon coder review may  be revised to meet current compliance requirements. Layla Lah, MD Layla Lah, MD 12/24/2023 10:29:04 AM Number of Addenda:  0 

## 2023-12-24 NOTE — Anesthesia Postprocedure Evaluation (Signed)
 Anesthesia Post Note  Patient: Emma Fowler  Procedure(s) Performed: EGD (ESOPHAGOGASTRODUODENOSCOPY)     Patient location during evaluation: PACU Anesthesia Type: MAC Level of consciousness: awake and alert Pain management: pain level controlled Vital Signs Assessment: post-procedure vital signs reviewed and stable Respiratory status: spontaneous breathing, nonlabored ventilation and respiratory function stable Cardiovascular status: blood pressure returned to baseline Postop Assessment: no apparent nausea or vomiting Anesthetic complications: no   No notable events documented.  Last Vitals:  Vitals:   12/24/23 1030 12/24/23 1040  BP: 108/62 109/71  Pulse: 77 77  Resp: 20 14  Temp:    SpO2: 100% 100%    Last Pain:  Vitals:   12/24/23 1040  TempSrc:   PainSc: 0-No pain                 Vertell Row

## 2023-12-24 NOTE — Discharge Summary (Signed)
 Physician Discharge Summary  Emma Fowler FMW:[id number] DOB: [birth year] DOA: 12/22/2023  PCP: Ricard Tawni KIDD, MD  Admit date: 12/22/2023 Discharge date: 12/24/2023 30 Day Unplanned Readmission Risk Score    Flowsheet Row ED to Hosp-Admission (Current) from 12/22/2023 in [facility]  30 Day Unplanned Readmission Risk Score (%) 8.61 Filed at 12/24/2023 0801    This score is the patient's risk of an unplanned readmission within 30 days of being discharged (0 -100%). The score is based on dignosis, age, lab data, medications, orders, and past utilization.   Low:  0-14.9   Medium: 15-21.9   High: 22-29.9   Extreme: 30 and above          Admitted From: Home Disposition: Home   Recommendations for Outpatient Follow-up:  Follow up with PCP in 1-2 weeks Please obtain BMP/CBC in one week Follow-up with GI in 4 weeks Please follow up with your PCP on the following pending results: Unresulted Labs (From admission, onward)     Start     Ordered   12/23/23 1700  CBC with Differential/Platelet  5A & 5P,   R (with TIMED occurrences)      12/23/23 1050              Home Health: None Equipment/Devices: None  Discharge Condition: Stable CODE STATUS: full Code Diet recommendation:  Diet Order             DIET SOFT Fluid consistency: Thin  Diet effective now                   Subjective: Seen and examined.  No complaints this morning.  Brief/Interim Summary:  Emma Fowler is a 38 y.o. female past medical history of polysubstance abuse on Suboxone  iron deficiency anemia paraesophageal hernia status post repair in January 2025 recently discharged from the hospital 2 weeks prior to admission, presented with abdominal pain nausea vomiting and 2 episodes of hematemesis.  She relates she takes ibuprofen  at least 3 times a day she has done that for the last several months.  Hemodynamically stable upon arrival to ED. CT scan  of the chest, abdomen and pelvis showed no acute findings stable Perry splenic fluid collection.  Admitted to hospital service.  Acute blood loss anemia secondary to gastritis and due to nighters/hematemesis/upper GI bleed secondary to influence of NSAID use: Started on PPI.  Hemoglobin down from 13.4 to11.1 today.  No indication of transfusion.  Underwent EGD which shows gastritis and duodenitis.  Cleared from GI perspective with recommendations to continue PPI twice daily for 4 weeks followed by PPI daily along with Carafate for 4 weeks.   History of narcotic dependence: - UDS is negative. - Will continue Suboxone .   Flat affect retracted: Seen by psychiatry.  According to them, this is situational.  No psychiatric needs were identified.   Leukocytosis: Likely due to hemoconcentration and has resolved.  No signs of infection.  Resolved.  Discharge plan was discussed with patient and/or family member and they verbalized understanding and agreed with it.  Discharge Diagnoses:  Principal Problem:   Acute upper GI bleed Active Problems:   Iron deficiency anemia   Hematemesis of fresh blood   Epigastric abdominal pain   Leukocytosis   Acute blood loss anemia   Esophagitis   Gastritis    Discharge Instructions   Allergies as of 12/24/2023   No Known Allergies      Medication List  STOP taking these medications    ibuprofen  200 MG tablet Commonly known as: ADVIL    ondansetron  4 MG disintegrating tablet Commonly known as: ZOFRAN -ODT       TAKE these medications    acetaminophen  500 MG tablet Commonly known as: TYLENOL  Take 1,000 mg by mouth daily as needed for mild pain (pain score 1-3) or moderate pain (pain score 4-6).   buprenorphine -naloxone  8-2 mg Subl SL tablet Commonly known as: SUBOXONE  Place 0.5-1 tablets under the tongue See admin instructions. Take 0.5 tablet by mouth in the morning and take 1 tablet by mouth at bedtime.   omeprazole 40 MG  capsule Commonly known as: PRILOSEC Take 1 capsule (40 mg total) by mouth 2 (two) times daily for 28 days, THEN 1 capsule (40 mg total) daily for 28 days. Start taking on: December 24, 2023   sucralfate 1 g tablet Commonly known as: Carafate Take 1 tablet (1 g total) by mouth 2 (two) times daily for 28 days.        Follow-up Information     Ricard Tawni KIDD, MD Follow up in 1 week(s).   Specialty: Family Medicine Contact information: [address] [place] [address] [phone]         Elicia Claw, MD Follow up in 1 month(s).   Specialty: Gastroenterology Contact information: [address] [place] [address] [phone]                No Known Allergies  Consultations: GI    Procedures/Studies: CT CHEST ABDOMEN PELVIS W CONTRAST Result Date: 12/22/2023 EXAM: CT CHEST, ABDOMEN AND PELVIS WITH CONTRAST 12/22/2023 12:08:19 PM TECHNIQUE: CT of the chest, abdomen and pelvis was performed with the administration of 100 mL (iohexol  (OMNIPAQUE ) 300 MG/ML solution 100 mL IOHEXOL  300 MG/ML SOLN) intravenous contrast. Multiplanar reformatted images are provided for review. Automated exposure control, iterative reconstruction, and/or weight based adjustment of the mA/kV was utilized to reduce the radiation dose to as low as reasonably achievable. COMPARISON: 12/05/2023 CLINICAL HISTORY: eval for hiatal herrnia repair obstruction or complication. Per chart: Pt reports with vomiting since Sunday. FINDINGS: CHEST: MEDIASTINUM AND LYMPH NODES: Heart and pericardium are unremarkable. The central airways are clear. No definite hiatal hernia is noted. No mediastinal, hilar or axillary lymphadenopathy. LUNGS AND PLEURA: No focal consolidation or pulmonary edema. No pleural effusion or pneumothorax. ABDOMEN AND PELVIS: LIVER: The liver is unremarkable. GALLBLADDER AND BILE DUCTS: Gallbladder is unremarkable. No biliary ductal dilatation. SPLEEN:  Splenic fluid collection is noted consistent with old hematoma or seroma. PANCREAS: No acute abnormality. ADRENAL GLANDS: No acute abnormality. KIDNEYS, URETERS AND BLADDER: No stones in the kidneys or ureters. No hydronephrosis. No perinephric or periureteral stranding. Urinary bladder is unremarkable. GI AND BOWEL: Stomach demonstrates no acute abnormality. Appendix is not clearly visualized. There is no bowel obstruction. REPRODUCTIVE ORGANS: Status post hysterectomy. PERITONEUM AND RETROPERITONEUM: No ascites. No free air. VASCULATURE: Aorta is normal in caliber. ABDOMINAL AND PELVIS LYMPH NODES: No lymphadenopathy. BONES AND SOFT TISSUES: Grossly stable. No acute osseous abnormality. No focal soft tissue abnormality. IMPRESSION: 1. No acute abnormality of the chest, abdomen, or pelvis. 2. Stable perisplenic fluid collection, compatible with chronic hematoma or seroma. 3. Status post hysterectomy. 4. Appendix not visualized. Electronically signed by: Lynwood Seip MD 12/22/2023 01:02 PM EDT RP Workstation: [id number]   ECHOCARDIOGRAM COMPLETE Result Date: 12/05/2023    ECHOCARDIOGRAM REPORT   Patient Name:   Emma Fowler Date of Exam: 12/05/2023 Medical Rec #:  [id number]          Height:       64.0 in Accession #:    [id number]         Weight:       155.0 lb Date of Birth:  January 12, 1986          BSA:          1.756 m Patient Age:    37 years           BP:           128/86 mmHg Patient Gender: F                  HR:           70 bpm. Exam Location:  Inpatient Procedure: 2D Echo, Cardiac Doppler and Color Doppler (Both Spectral and Color            Flow Doppler were utilized during procedure). Indications:    Splenic Infarct D73.5  History:        Patient has no prior history of Echocardiogram examinations.  Sonographer:    Damien Senior RDCS Referring Phys: [id number] MIR [facility] IMPRESSIONS  1. Left ventricular ejection fraction, by estimation, is 60 to 65%. The left ventricle has normal function. The left  ventricle has no regional wall motion abnormalities. Left ventricular diastolic parameters were normal.  2. Right ventricular systolic function is normal. The right ventricular size is normal.  3. The mitral valve is normal in structure. No evidence of mitral valve regurgitation. No evidence of mitral stenosis.  4. The aortic valve is normal in structure. Aortic valve regurgitation is not visualized. No aortic stenosis is present.  5. The inferior vena cava is dilated in size with <50% respiratory variability, suggesting right atrial pressure of 15 mmHg. FINDINGS  Left Ventricle: Left ventricular ejection fraction, by estimation, is 60 to 65%. The left ventricle has normal function. The left ventricle has no regional wall motion abnormalities. The left ventricular internal cavity size was normal in size. There is  no left ventricular hypertrophy. Left ventricular diastolic parameters were normal. Right Ventricle: The right ventricular size is normal. No increase in right ventricular wall thickness. Right ventricular systolic function is normal. Left Atrium: Left atrial size was normal in size. Right Atrium: Right atrial size was normal in size. Pericardium: There is no evidence of pericardial effusion. Mitral Valve: The mitral valve is normal in structure. No evidence of mitral valve regurgitation. No evidence of mitral valve stenosis. Tricuspid Valve: The tricuspid valve is normal in structure. Tricuspid valve regurgitation is not demonstrated. No evidence of tricuspid stenosis. Aortic Valve: The aortic valve is normal in structure. Aortic valve regurgitation is not visualized. No aortic stenosis is present. Pulmonic Valve: The pulmonic valve was normal in structure. Pulmonic valve regurgitation is not visualized. No evidence of pulmonic stenosis. Aorta: The aortic root is normal in size and structure. Venous: The inferior vena cava is dilated in size with less than 50% respiratory variability, suggesting right  atrial pressure of 15 mmHg. IAS/Shunts: No atrial level shunt detected by color flow Doppler.  LEFT VENTRICLE PLAX 2D LVIDd:         4.90 cm   Diastology LVIDs:         3.30 cm   LV e' medial:    12.00 cm/s LV PW:         0.70 cm   LV E/e' medial:  7.2 LV IVS:  0.60 cm   LV e' lateral:   14.50 cm/s LVOT diam:     2.00 cm   LV E/e' lateral: 6.0 LV SV:         72 LV SV Index:   41 LVOT Area:     3.14 cm LV IVRT:       134 msec  RIGHT VENTRICLE RV S prime:     12.30 cm/s TAPSE (M-mode): 2.5 cm LEFT ATRIUM             Index        RIGHT ATRIUM           Index LA diam:        3.20 cm 1.82 cm/m   RA Area:     11.60 cm LA Vol (A2C):   44.3 ml 25.23 ml/m  RA Volume:   25.20 ml  14.35 ml/m LA Vol (A4C):   45.9 ml 26.15 ml/m LA Biplane Vol: 45.4 ml 25.86 ml/m  AORTIC VALVE LVOT Vmax:   110.00 cm/s LVOT Vmean:  77.300 cm/s LVOT VTI:    0.230 m  AORTA Ao Root diam: 2.80 cm MITRAL VALVE MV Area (PHT): 3.21 cm    SHUNTS MV Decel Time: 236 msec    Systemic VTI:  0.23 m MV E velocity: 86.50 cm/s  Systemic Diam: 2.00 cm MV A velocity: 63.80 cm/s MV E/A ratio:  1.36 Oneil Parchment MD Electronically signed by Oneil Parchment MD Signature Date/Time: 12/05/2023/2:46:12 PM    Final    CT ABDOMEN PELVIS W CONTRAST Result Date: 12/05/2023 CLINICAL DATA:  Epigastric pain abdominal pain.  Vomiting. EXAM: CT ABDOMEN AND PELVIS WITH CONTRAST TECHNIQUE: Multidetector CT imaging of the abdomen and pelvis was performed using the standard protocol following bolus administration of intravenous contrast. RADIATION DOSE REDUCTION: This exam was performed according to the departmental dose-optimization program which includes automated exposure control, adjustment of the mA and/or kV according to patient size and/or use of iterative reconstruction technique. CONTRAST:  OMNIPAQUE  IOHEXOL  300 MG/ML  SOLN COMPARISON:  CT 01/16/2023 FINDINGS: Lower chest: Lung bases are clear. Hepatobiliary: No focal hepatic lesion. Normal gallbladder. No  biliary duct dilatation. Common bile duct is normal. Pancreas: Pancreas is normal. No ductal dilatation. No pancreatic inflammation. Spleen: There is a low-density fluid collection extending along the lateral margin of the spleen position between the spleen and the under surface of the diaphragm. This collection measures 6.2 x 1.9 cm in axial dimension (image 17/series 2. There is some loss of the normal splenic parenchyma compared to most recent CT exam 2024. The fluid collection is relatively low-density but not water  density. Adrenals/urinary tract: Adrenal glands and kidneys are normal. The ureters and bladder normal. Stomach/Bowel: Interval repair of the para esophageal hernia. The stomach is now beneath the LEFT hemidiaphragm. Distal esophagus appears normal. small bowel, appendix, and cecum are normal. The colon and rectosigmoid colon are normal. Vascular/Lymphatic: Abdominal aorta is normal caliber. No periportal or retroperitoneal adenopathy. No pelvic adenopathy. Reproductive: Post hysterectomy.  Adnexa unremarkable Other: No free fluid. Musculoskeletal: No aggressive osseous lesion. IMPRESSION: 1. Low-density fluid collection along the lateral margin of the spleen suggests a chronic or subacute subcapsular splenic hematoma. Splenic infarction less likely. Findings may relate to prior gastric paraesophageal surgery. 2. Interval repair of paraesophageal hernia. Stomach is now beneath the LEFT hemidiaphragm. 3. No evidence of bowel obstruction. Electronically Signed   By: Jackquline Boxer M.D.   On: 12/05/2023 10:09     Discharge Exam: Vitals:  12/24/23 1030 12/24/23 1040  BP: 108/62 109/71  Pulse: 77 77  Resp: 20 14  Temp:    SpO2: 100% 100%   Vitals:   12/24/23 1015 12/24/23 1025 12/24/23 1030 12/24/23 1040  BP: 126/76 120/74 108/62 109/71  Pulse: 84 73 77 77  Resp: 13 12 20 14   Temp: 98.1 F (36.7 C)     TempSrc: Temporal     SpO2: 100% 98% 100% 100%  Weight:      Height:         General: Pt is alert, awake, not in acute distress Cardiovascular: RRR, S1/S2 +, no rubs, no gallops Respiratory: CTA bilaterally, no wheezing, no rhonchi Abdominal: Soft, NT, ND, bowel sounds + Extremities: no edema, no cyanosis    The results of significant diagnostics from this hospitalization (including imaging, microbiology, ancillary and laboratory) are listed below for reference.     Microbiology: No results found for this or any previous visit (from the past 240 hours).   Labs: BNP (last 3 results) No results for input(s): BNP in the last 8760 hours. Basic Metabolic Panel: Recent Labs  Lab 12/22/23 0923 12/23/23 0427 12/24/23 0425  NA 140 141 141  K 3.6 3.5 3.8  CL 102 106 105  CO2 25 25 27   GLUCOSE 106* 84 86  BUN 17 8 9   CREATININE 0.68 0.52 0.48  CALCIUM 10.2 9.1 9.2   Liver Function Tests: Recent Labs  Lab 12/22/23 0923 12/23/23 0427  AST 26 17  ALT 26 18  ALKPHOS 73 56  BILITOT 0.6 0.7  PROT 7.6 5.6*  ALBUMIN 4.7 3.7   Recent Labs  Lab 12/22/23 0923  LIPASE 22   No results for input(s): AMMONIA in the last 168 hours. CBC: Recent Labs  Lab 12/22/23 0923 12/23/23 0427 12/23/23 1722 12/24/23 0425  WBC 12.1* 10.1 10.8* 9.1  NEUTROABS 10.3*  --  7.7 4.1  HGB 13.4 11.1* 12.3 11.1*  HCT 40.8 34.0* 38.3 35.3*  MCV 89.9 91.2 89.7 90.1  PLT 443* 345 396 356   Cardiac Enzymes: No results for input(s): CKTOTAL, CKMB, CKMBINDEX, TROPONINI in the last 168 hours. BNP: Invalid input(s): POCBNP CBG: No results for input(s): GLUCAP in the last 168 hours. D-Dimer No results for input(s): DDIMER in the last 72 hours. Hgb A1c No results for input(s): HGBA1C in the last 72 hours. Lipid Profile No results for input(s): CHOL, HDL, LDLCALC, TRIG, CHOLHDL, LDLDIRECT in the last 72 hours. Thyroid function studies No results for input(s): TSH, T4TOTAL, T3FREE, THYROIDAB in the last 72 hours.  Invalid  input(s): FREET3 Anemia work up No results for input(s): VITAMINB12, FOLATE, FERRITIN, TIBC, IRON, RETICCTPCT in the last 72 hours. Urinalysis    Component Value Date/Time   COLORURINE YELLOW 12/22/2023 0923   APPEARANCEUR CLOUDY (A) 12/22/2023 0923   APPEARANCEUR Cloudy 12/26/2013 1144   LABSPEC 1.027 12/22/2023 0923   LABSPEC 1.025 12/26/2013 1144   PHURINE 5.0 12/22/2023 0923   GLUCOSEU NEGATIVE 12/22/2023 0923   GLUCOSEU Negative 12/26/2013 1144   HGBUR NEGATIVE 12/22/2023 0923   BILIRUBINUR NEGATIVE 12/22/2023 0923   BILIRUBINUR Negative 12/26/2013 1144   KETONESUR 5 (A) 12/22/2023 0923   PROTEINUR 30 (A) 12/22/2023 0923   NITRITE NEGATIVE 12/22/2023 0923   LEUKOCYTESUR NEGATIVE 12/22/2023 0923   LEUKOCYTESUR 2+ 12/26/2013 1144   Sepsis Labs Recent Labs  Lab 12/22/23 0923 12/23/23 0427 12/23/23 1722 12/24/23 0425  WBC 12.1* 10.1 10.8* 9.1   Microbiology No results found for this or any previous  visit (from the past 240 hours).  FURTHER DISCHARGE INSTRUCTIONS:   Get Medicines reviewed and adjusted: Please take all your medications with you for your next visit with your Primary MD   Laboratory/radiological data: Please request your Primary MD to go over all hospital tests and procedure/radiological results at the follow up, please ask your Primary MD to get all Hospital records sent to his/her office.   In some cases, they will be blood work, cultures and biopsy results pending at the time of your discharge. Please request that your primary care M.D. goes through all the records of your hospital data and follows up on these results.   Also Note the following: If you experience worsening of your admission symptoms, develop shortness of breath, life threatening emergency, suicidal or homicidal thoughts you must seek medical attention immediately by calling 911 or calling your MD immediately  if symptoms less severe.   You must read complete  instructions/literature along with all the possible adverse reactions/side effects for all the Medicines you take and that have been prescribed to you. Take any new Medicines after you have completely understood and accpet all the possible adverse reactions/side effects.    patient was instructed, not to drive, operate heavy machinery, perform activities at heights, swimming or participation in water  activities or provide baby-sitting services while on Pain, Sleep and Anxiety Medications; until their outpatient Physician has advised to do so again. Also recommended to not to take more than prescribed Pain, Sleep and Anxiety Medications.  It is not advisable to combine anxiety, sleep and pain medications without talking with your primary care provider.     Wear Seat belts while driving.   Please note: You were cared for by a hospitalist during your hospital stay. Once you are discharged, your primary care physician will handle any further medical issues. Please note that NO REFILLS for any discharge medications will be authorized once you are discharged, as it is imperative that you return to your primary care physician (or establish a relationship with a primary care physician if you do not have one) for your post hospital discharge needs so that they can reassess your need for medications and monitor your lab values  Time coordinating discharge: Over 30 minutes  SIGNED:   Fredia Skeeter, MD  [facility] 12/24/2023, 1:11 PM *Please note that this is a verbal dictation therefore any spelling or grammatical errors are due to the Dragon Medical One system interpretation. If 7PM-7AM, please contact night-coverage [email or web address]

## 2023-12-24 NOTE — Brief Op Note (Signed)
 12/24/2023  10:30 AM  PATIENT:  Emma Fowler  38 y.o. female  PRE-OPERATIVE DIAGNOSIS:  Hematemesis, nausea and vomiting  POST-OPERATIVE DIAGNOSIS:  gastrix bx r/o h pylori, esophageal bx r/o esophagitis  PROCEDURE:  Procedure(s): EGD (ESOPHAGOGASTRODUODENOSCOPY) (N/A)  SURGEON:  Surgeons and Role:    * Callie Facey, MD - Primary  Findings ------------ - EGD showed mild esophagitis and gastritis.  Intact fundoplication and bilious fluid in the stomach.  Recommendations --------------------------- - Recommend Protonix 40 mg twice a day for 4 weeks followed by Protonix 40 mg once a day for another 4 weeks -Avoid NSAIDs - Recommend sucralfate 1 g twice daily for 4 weeks - No further inpatient GI workup planned.  GI will sign off.  Call us  back if needed  Layla Lah MD, FACP 12/24/2023, 10:32 AM  Contact #  [phone]

## 2023-12-24 NOTE — Plan of Care (Signed)
   Problem: Education: Goal: Knowledge of General Education information will improve Description Including pain rating scale, medication(s)/side effects and non-pharmacologic comfort measures Outcome: Progressing   Problem: Health Behavior/Discharge Planning: Goal: Ability to manage health-related needs will improve Outcome: Progressing

## 2023-12-24 NOTE — Plan of Care (Signed)

## 2023-12-24 NOTE — Anesthesia Preprocedure Evaluation (Signed)
 Anesthesia Evaluation  Patient identified by MRN, date of birth, ID band Patient awake    Reviewed: Allergy & Precautions, NPO status , Patient's Chart, lab work & pertinent test results  History of Anesthesia Complications Negative for: history of anesthetic complications  Airway Mallampati: II  TM Distance: >3 FB Neck ROM: Full    Dental  (+) Poor Dentition, Missing   Pulmonary Current Smoker and Patient abstained from smoking.   Pulmonary exam normal        Cardiovascular Normal cardiovascular exam     Neuro/Psych    Depression       GI/Hepatic ,,,(+)     substance abuse (Hx of OUD on Suboxone )  Hematemesis, nausea and vomiting   Endo/Other    Renal/GU      Musculoskeletal   Abdominal   Peds  Hematology  (+) Blood dyscrasia, anemia   Anesthesia Other Findings   Reproductive/Obstetrics                              Anesthesia Physical Anesthesia Plan  ASA: 3  Anesthesia Plan: MAC   Post-op Pain Management: Minimal or no pain anticipated   Induction:   PONV Risk Score and Plan: Treatment may vary due to age or medical condition and Propofol  infusion  Airway Management Planned: Natural Airway and Nasal Cannula  Additional Equipment: None  Intra-op Plan:   Post-operative Plan:   Informed Consent: I have reviewed the patients History and Physical, chart, labs and discussed the procedure including the risks, benefits and alternatives for the proposed anesthesia with the patient or authorized representative who has indicated his/her understanding and acceptance.       Plan Discussed with: CRNA  Anesthesia Plan Comments:          Anesthesia Quick Evaluation

## 2023-12-24 NOTE — Progress Notes (Signed)
 Discharge medications delivered to patient at the bedside.

## 2023-12-24 NOTE — Transfer of Care (Signed)
 Immediate Anesthesia Transfer of Care Note  Patient: Emma Fowler  Procedure(s) Performed: EGD (ESOPHAGOGASTRODUODENOSCOPY)  Patient Location: PACU  Anesthesia Type:MAC  Level of Consciousness: awake, alert , oriented, and patient cooperative  Airway & Oxygen Therapy: Patient Spontanous Breathing and Patient connected to face mask oxygen  Post-op Assessment: Report given to RN and Post -op Vital signs reviewed and stable  Post vital signs: Reviewed and stable  Last Vitals:  Vitals Value Taken Time  BP    Temp    Pulse    Resp    SpO2      Last Pain:  Vitals:   12/24/23 0934  TempSrc: Temporal  PainSc: 0-No pain         Complications: No notable events documented.

## 2023-12-24 NOTE — Interval H&P Note (Signed)
 History and Physical Interval Note:  12/24/2023 9:48 AM  Emma Fowler  has presented today for surgery, with the diagnosis of Hematemesis, nausea and vomiting.  The various methods of treatment have been discussed with the patient and family. After consideration of risks, benefits and other options for treatment, the patient has consented to  Procedure(s): EGD (ESOPHAGOGASTRODUODENOSCOPY) (N/A) as a surgical intervention.  The patient's history has been reviewed, patient examined, no change in status, stable for surgery.  I have reviewed the patient's chart and labs.  Questions were answered to the patient's satisfaction.     [name]

## 2023-12-27 ENCOUNTER — Encounter (HOSPITAL_COMMUNITY): Payer: Self-pay | Admitting: Gastroenterology

## 2023-12-28 LAB — SURGICAL PATHOLOGY
